# Patient Record
Sex: Female | Born: 1955 | Race: White | Hispanic: No | Marital: Married | Smoking: Never smoker
Health system: Southern US, Community
[De-identification: ages and names within clinical notes are randomized; demographics above are authoritative.]

## PROBLEM LIST (undated history)

## (undated) DIAGNOSIS — I10 Essential (primary) hypertension: Secondary | ICD-10-CM

## (undated) DIAGNOSIS — R232 Flushing: Secondary | ICD-10-CM

## (undated) DIAGNOSIS — C50919 Malignant neoplasm of unspecified site of unspecified female breast: Secondary | ICD-10-CM

## (undated) DIAGNOSIS — M1990 Unspecified osteoarthritis, unspecified site: Secondary | ICD-10-CM

## (undated) DIAGNOSIS — G4733 Obstructive sleep apnea (adult) (pediatric): Secondary | ICD-10-CM

## (undated) DIAGNOSIS — K219 Gastro-esophageal reflux disease without esophagitis: Secondary | ICD-10-CM

## (undated) DIAGNOSIS — F419 Anxiety disorder, unspecified: Secondary | ICD-10-CM

## (undated) DIAGNOSIS — Z9989 Dependence on other enabling machines and devices: Secondary | ICD-10-CM

## (undated) DIAGNOSIS — F32A Depression, unspecified: Secondary | ICD-10-CM

## (undated) DIAGNOSIS — F329 Major depressive disorder, single episode, unspecified: Secondary | ICD-10-CM

## (undated) DIAGNOSIS — E785 Hyperlipidemia, unspecified: Secondary | ICD-10-CM

## (undated) HISTORY — PX: TUBAL LIGATION: SHX77

## (undated) HISTORY — DX: Depression, unspecified: F32.A

## (undated) HISTORY — DX: Gastro-esophageal reflux disease without esophagitis: K21.9

## (undated) HISTORY — PX: APPENDECTOMY: SHX54

## (undated) HISTORY — DX: Obstructive sleep apnea (adult) (pediatric): G47.33

## (undated) HISTORY — PX: OTHER SURGICAL HISTORY: SHX169

## (undated) HISTORY — DX: Essential (primary) hypertension: I10

## (undated) HISTORY — DX: Major depressive disorder, single episode, unspecified: F32.9

## (undated) HISTORY — DX: Anxiety disorder, unspecified: F41.9

## (undated) HISTORY — DX: Dependence on other enabling machines and devices: Z99.89

## (undated) HISTORY — DX: Flushing: R23.2

## (undated) HISTORY — DX: Hyperlipidemia, unspecified: E78.5

## (undated) HISTORY — DX: Malignant neoplasm of unspecified site of unspecified female breast: C50.919

---

## 1998-10-02 ENCOUNTER — Encounter (INDEPENDENT_AMBULATORY_CARE_PROVIDER_SITE_OTHER): Payer: Self-pay | Admitting: *Deleted

## 1998-10-02 ENCOUNTER — Ambulatory Visit (HOSPITAL_COMMUNITY): Admission: RE | Admit: 1998-10-02 | Discharge: 1998-10-02 | Payer: Self-pay | Admitting: Obstetrics and Gynecology

## 1998-11-23 ENCOUNTER — Ambulatory Visit (HOSPITAL_COMMUNITY): Admission: RE | Admit: 1998-11-23 | Discharge: 1998-11-23 | Payer: Self-pay | Admitting: *Deleted

## 1998-11-24 ENCOUNTER — Other Ambulatory Visit: Admission: RE | Admit: 1998-11-24 | Discharge: 1998-11-24 | Payer: Self-pay | Admitting: Obstetrics and Gynecology

## 1999-11-25 ENCOUNTER — Other Ambulatory Visit: Admission: RE | Admit: 1999-11-25 | Discharge: 1999-11-25 | Payer: Self-pay | Admitting: Obstetrics and Gynecology

## 2000-08-07 ENCOUNTER — Encounter: Payer: Self-pay | Admitting: Diagnostic Radiology

## 2000-08-07 ENCOUNTER — Encounter: Admission: RE | Admit: 2000-08-07 | Discharge: 2000-08-07 | Payer: Self-pay | Admitting: Endocrinology

## 2000-08-07 ENCOUNTER — Encounter: Admission: RE | Admit: 2000-08-07 | Discharge: 2000-08-07 | Payer: Self-pay | Admitting: Diagnostic Radiology

## 2000-08-07 ENCOUNTER — Encounter: Payer: Self-pay | Admitting: Endocrinology

## 2001-01-16 ENCOUNTER — Other Ambulatory Visit: Admission: RE | Admit: 2001-01-16 | Discharge: 2001-01-16 | Payer: Self-pay | Admitting: Obstetrics and Gynecology

## 2001-09-27 ENCOUNTER — Encounter (INDEPENDENT_AMBULATORY_CARE_PROVIDER_SITE_OTHER): Payer: Self-pay

## 2001-09-27 ENCOUNTER — Ambulatory Visit (HOSPITAL_COMMUNITY): Admission: RE | Admit: 2001-09-27 | Discharge: 2001-09-27 | Payer: Self-pay | Admitting: Obstetrics and Gynecology

## 2001-09-27 ENCOUNTER — Encounter: Payer: Self-pay | Admitting: Obstetrics and Gynecology

## 2002-03-12 ENCOUNTER — Encounter: Admission: RE | Admit: 2002-03-12 | Discharge: 2002-03-12 | Payer: Self-pay | Admitting: Obstetrics & Gynecology

## 2002-03-12 ENCOUNTER — Other Ambulatory Visit: Admission: RE | Admit: 2002-03-12 | Discharge: 2002-03-12 | Payer: Self-pay | Admitting: Obstetrics & Gynecology

## 2002-03-12 ENCOUNTER — Encounter: Payer: Self-pay | Admitting: Obstetrics & Gynecology

## 2002-03-14 ENCOUNTER — Other Ambulatory Visit: Admission: RE | Admit: 2002-03-14 | Discharge: 2002-03-14 | Payer: Self-pay | Admitting: General Surgery

## 2003-05-06 ENCOUNTER — Other Ambulatory Visit: Admission: RE | Admit: 2003-05-06 | Discharge: 2003-05-06 | Payer: Self-pay | Admitting: Obstetrics and Gynecology

## 2004-03-28 HISTORY — PX: BREAST SURGERY: SHX581

## 2004-05-26 ENCOUNTER — Other Ambulatory Visit: Admission: RE | Admit: 2004-05-26 | Discharge: 2004-05-26 | Payer: Self-pay | Admitting: General Surgery

## 2004-06-03 ENCOUNTER — Encounter: Admission: RE | Admit: 2004-06-03 | Discharge: 2004-06-03 | Payer: Self-pay | Admitting: General Surgery

## 2004-06-04 ENCOUNTER — Ambulatory Visit (HOSPITAL_BASED_OUTPATIENT_CLINIC_OR_DEPARTMENT_OTHER): Admission: RE | Admit: 2004-06-04 | Discharge: 2004-06-04 | Payer: Self-pay | Admitting: General Surgery

## 2004-06-04 ENCOUNTER — Encounter (INDEPENDENT_AMBULATORY_CARE_PROVIDER_SITE_OTHER): Payer: Self-pay | Admitting: Specialist

## 2004-06-04 ENCOUNTER — Ambulatory Visit (HOSPITAL_COMMUNITY): Admission: RE | Admit: 2004-06-04 | Discharge: 2004-06-04 | Payer: Self-pay | Admitting: General Surgery

## 2004-09-30 ENCOUNTER — Encounter: Admission: RE | Admit: 2004-09-30 | Discharge: 2004-09-30 | Payer: Self-pay | Admitting: Endocrinology

## 2005-05-30 ENCOUNTER — Encounter: Admission: RE | Admit: 2005-05-30 | Discharge: 2005-05-30 | Payer: Self-pay | Admitting: Obstetrics and Gynecology

## 2005-06-14 ENCOUNTER — Encounter: Admission: RE | Admit: 2005-06-14 | Discharge: 2005-06-14 | Payer: Self-pay | Admitting: Obstetrics and Gynecology

## 2005-12-09 ENCOUNTER — Encounter: Admission: RE | Admit: 2005-12-09 | Discharge: 2005-12-09 | Payer: Self-pay | Admitting: General Surgery

## 2006-06-22 ENCOUNTER — Encounter: Admission: RE | Admit: 2006-06-22 | Discharge: 2006-06-22 | Payer: Self-pay | Admitting: Obstetrics and Gynecology

## 2006-08-28 ENCOUNTER — Encounter (INDEPENDENT_AMBULATORY_CARE_PROVIDER_SITE_OTHER): Payer: Self-pay | Admitting: *Deleted

## 2006-08-28 ENCOUNTER — Ambulatory Visit (HOSPITAL_COMMUNITY): Admission: RE | Admit: 2006-08-28 | Discharge: 2006-08-28 | Payer: Self-pay | Admitting: *Deleted

## 2006-10-30 ENCOUNTER — Encounter: Admission: RE | Admit: 2006-10-30 | Discharge: 2006-10-30 | Payer: Self-pay | Admitting: Endocrinology

## 2007-06-25 ENCOUNTER — Encounter: Admission: RE | Admit: 2007-06-25 | Discharge: 2007-06-25 | Payer: Self-pay | Admitting: Obstetrics and Gynecology

## 2008-06-26 ENCOUNTER — Encounter: Admission: RE | Admit: 2008-06-26 | Discharge: 2008-06-26 | Payer: Self-pay | Admitting: Endocrinology

## 2009-07-02 ENCOUNTER — Encounter: Admission: RE | Admit: 2009-07-02 | Discharge: 2009-07-02 | Payer: Self-pay | Admitting: Obstetrics and Gynecology

## 2009-08-31 ENCOUNTER — Encounter (INDEPENDENT_AMBULATORY_CARE_PROVIDER_SITE_OTHER): Payer: Self-pay | Admitting: *Deleted

## 2009-09-18 ENCOUNTER — Encounter (INDEPENDENT_AMBULATORY_CARE_PROVIDER_SITE_OTHER): Payer: Self-pay | Admitting: *Deleted

## 2009-10-27 ENCOUNTER — Ambulatory Visit: Payer: Self-pay | Admitting: Gastroenterology

## 2010-04-18 ENCOUNTER — Encounter: Payer: Self-pay | Admitting: Obstetrics and Gynecology

## 2010-04-27 NOTE — Letter (Signed)
Summary: Previsit letter  [facility]  [address] [place], [place] [address]   Phone: [phone]  Fax: [phone]       08/31/2009 MRN: [id number]  Sherry Stafford [address] [place], [place]  [address]  Dear Ms. Sherry Stafford,  Welcome to the Gastroenterology Division at [facility].    You are scheduled to see a nurse for your pre-procedure visit on 09-25-09 at 2:00p.m. on the 3rd floor at [facility], [address].  We ask that you try to arrive at our office 15 minutes prior to your appointment time to allow for check-in.  Your nurse visit will consist of discussing your medical and surgical history, your immediate family medical history, and your medications.    Please bring a complete list of all your medications or, if you prefer, bring the medication bottles and we will list them.  We will need to be aware of both prescribed and over the counter drugs.  We will need to know exact dosage information as well.  If you are on blood thinners (Coumadin, Plavix, Aggrenox, Ticlid, etc.) please call our office today/prior to your appointment, as we need to consult with your physician about holding your medication.   Please be prepared to read and sign documents such as consent forms, a financial agreement, and acknowledgement forms.  If necessary, and with your consent, a friend or relative is welcome to sit-in on the nurse visit with you.  Please bring your insurance card so that we may make a copy of it.  If your insurance requires a referral to see a specialist, please bring your referral form from your primary care physician.  No co-pay is required for this nurse visit.     If you cannot keep your appointment, please call [phone] to cancel or reschedule prior to your appointment date.  This allows [place] the opportunity to schedule an appointment for another patient in need of care.    Thank you for choosing [facility] for your medical needs.   We appreciate the opportunity to care for you.  Please visit [place] at our website  to learn more about our practice.                     Sincerely.                                                                                                                   The Gastroenterology Division  Appended Document: Previsit letter patty, please call her.  I just recieved faxed notes from Dr. Wende Neighbors office about the patient whom I have never seen before.  It looks like she is being scheduled for a "direct" colonoscopy or perhaps EGD.  please cancel whatever procedure she is scheduled for and she will need ngi appt with me in office first.  she had colonoscopy June 2008 and EGD at same time...she had no polyps, cancers or barrett's on the exams.  It is not clear to  me why she needs any type of procedures now and so she needs to be seen in office first.  Appended Document: Previsit letter procedures cx and message left on home phone for pt to call and schedule NGI  Appended Document: Previsit letter left message on machine to call back   Appended Document: Previsit letter pt aware New pt appt made and new pt paper work mailed out

## 2010-04-27 NOTE — Assessment & Plan Note (Signed)
History of Present Illness Visit Type: Initial Visit Primary GI MD: Rob Bunting MD Primary Provider: Darci Needle, MD Chief Complaint: Discuss ECL History of Present Illness:      very pleasant 55 year old woman who is here to discuss having a colonoscopy due to her family history of colon cancer.  She has had 2 colonoscopies by Dr. Sabino Gasser.  We have records of one being done in June 2008 and it was normal. She is very certain that date is incorrect however we also have biopsy reports from that date from the EGD she also had done and it was dated 2008 as well.  she has never had colon polyps.  Was told she need repeat colonoscopy every 5 years.  she has had no changes in her bowel habits. No bleeding, no constipation, no worrisome diarrhea.           Current Medications (verified): 1)  Wellbutrin Xl 150 Mg Xr24h-Tab (Bupropion Hcl) .... Take One By Mouth Once Daily 2)  Crestor 5 Mg Tabs (Rosuvastatin Calcium) .... Take One By Mouth Once Daily 3)  Vitamin D 1000 Unit Tabs (Cholecalciferol) .... Take One By Mouth Once Daily 4)  Calcium 600 Mg Tabs (Calcium) .... Take One By Mouth Once Daily 5)  Coq10 100 Mg Caps (Coenzyme Q10) .... Take One By Mouth Once Daily 6)  Omega-3 350 Mg Caps (Omega-3 Fatty Acids) .... Take One By Mouth Once Daily 7)  Multivitamins  Tabs (Multiple Vitamin) .... Take One By Mouth Once Daily  Allergies (verified): No Known Drug Allergies  Past History:  Past Medical History: none  Past Surgical History: appendectomy 1971  Family History: father had colon cancer in his 63s  Social History: she is divorced, she has 2 children, she works for [employer], she does not smoke cigarettes, she drinks one alcoholic beverage per day.  Review of Systems       Pertinent positive and negative review of systems were noted in the above HPI and GI specific review of systems.  All other review of systems was otherwise negative.   Vital  Signs:  Patient profile:   55 year old female Height:      67 inches Weight:      182.0 pounds BMI:     28.61 Pulse rate:   88 / minute Pulse rhythm:   regular BP sitting:   118 / 78  (left arm) Cuff size:   regular  Vitals Entered By: Harlow Mares CMA Duncan Dull) (October 27, 2009 2:03 PM)  Physical Exam  Additional Exam:  Constitutional: generally well appearing Psychiatric: alert and oriented times 3 Eyes: extraocular movements intact Mouth: oropharynx moist, no lesions Neck: supple, no lymphadenopathy Cardiovascular: heart regular rate and rythm Lungs: CTA bilaterally Abdomen: soft, non-tender, non-distended, no obvious ascites, no peritoneal signs, normal bowel sounds Extremities: no lower extremity edema bilaterally Skin: no lesions on visible extremities    Impression & Recommendations:  Problem # 1:  Family history of colon cancer she will need a colonoscopy 5 years from that date of her most recent one. We will call over to Dr. Wende Neighbors office to get a hard copy of his reports. It does look according to my records that she had a colonoscopy in 2008 that she is fairly certain that that date did not sound correct. Whenever his heart copy report shows however we will go from that date onward.  Patient Instructions: 1)  We will get records sent over from Dr. Wende Neighbors office  from recent colonoscopy to make sure of the date and will schedule you for repeat at 5 year interval. 2)  The medication list was reviewed and reconciled.  All changed / newly prescribed medications were explained.  A complete medication list was provided to the patient / caregiver.

## 2010-04-27 NOTE — Procedures (Signed)
Summary: Colon   Colonoscopy  Procedure date:  08/28/2006  Findings:      Location:  [facility].   NAME:  Sherry Stafford, Sherry Stafford                ACCOUNT NO.:  [id number]      MEDICAL RECORD NO.:  [id number]          PATIENT TYPE:  AMB      LOCATION:  ENDO                         FACILITY:  [facility]      PHYSICIAN:  Georgiana Spinner, M.D.    DATE OF BIRTH:  1956-02-23      DATE OF PROCEDURE:   DATE OF DISCHARGE:                                  OPERATIVE REPORT      ANESTHESIA:  Demerol 50, Versed 7.5 mg.      PROCEDURE:  With the patient mildly sedated in the left lateral   decubitus position, the Pentax videoscopic colonoscope was inserted in   the rectum and passed under direct vision to the cecum identified by the   ileocecal valve and appendiceal orifice, both of which were   photographed.  From this point, the colonoscope was slowly withdrawn,   taking circumferential views of the colonic mucosa, stopping only in the   rectum which appeared normal on direct, showed hemorrhoids in   retroflexed view.  The endoscope was straightened and withdrawn.  The   patient's vital signs and pulse oximetry remained stable.  The patient   tolerated the procedure well with no apparent complications.      FINDINGS:  Internal hemorrhoids, otherwise, an unremarkable exam.      PLAN:  See endoscopy note for further details.                  ______________________________   Georgiana Spinner, M.D.            GMO/MEDQ  D:  08/28/2006  T:  08/28/2006  Job:  [id number]

## 2010-04-27 NOTE — Letter (Signed)
Summary: New Patient letter  [facility]  [address] [place], [place] [address]   Phone: [phone]  Fax: [phone]       09/18/2009 MRN: [id number]  Sherry Stafford [address] [place], [place]  [address]  Dear Ms. Sherry Stafford,  Welcome to the Gastroenterology Division at [facility].    You are scheduled to see Dr.  Christella Hartigan on 10/27/09  at 2 pm on the 3rd floor at [facility], [address].  We ask that you try to arrive at our office 15 minutes prior to your appointment time to allow for check-in.  We would like you to complete the enclosed self-administered evaluation form prior to your visit and bring it with you on the day of your appointment.  We will review it with you.  Also, please bring a complete list of all your medications or, if you prefer, bring the medication bottles and we will list them.  Please bring your insurance card so that we may make a copy of it.  If your insurance requires a referral to see a specialist, please bring your referral form from your primary care physician.  Co-payments are due at the time of your visit and may be paid by cash, check or credit card.     Your office visit will consist of a consult with your physician (includes a physical exam), any laboratory testing he/she may order, scheduling of any necessary diagnostic testing (e.g. x-ray, ultrasound, CT-scan), and scheduling of a procedure (e.g. Endoscopy, Colonoscopy) if required.  Please allow enough time on your schedule to allow for any/all of these possibilities.    If you cannot keep your appointment, please call [phone] to cancel or reschedule prior to your appointment date.  This allows [place] the opportunity to schedule an appointment for another patient in need of care.  If you do not cancel or reschedule by 5 p.m. the business day prior to your appointment date, you will be charged a $50.00 late cancellation/no-show fee.    Thank you for choosing [facility] for your medical needs.  We appreciate the opportunity to care for you.  Please visit [place] at our website  to learn more about our practice.                     Sincerely,                                                             The Gastroenterology Division   Appended Document: New Patient letter letter mailed

## 2010-04-27 NOTE — Procedures (Signed)
Summary: EGD   EGD  Procedure date:  08/28/2006  Findings:      Location: [facility]   NAME:  Sherry Stafford, Sherry Stafford                ACCOUNT NO.:  [id number]      MEDICAL RECORD NO.:  [id number]          PATIENT TYPE:  AMB      LOCATION:  ENDO                         FACILITY:  [facility]      PHYSICIAN:  Georgiana Spinner, M.D.    DATE OF BIRTH:  03-23-1956      DATE OF PROCEDURE:   DATE OF DISCHARGE:                                  OPERATIVE REPORT      PROCEDURE:  Upper endoscopy.      INDICATION:  Gastroesophageal reflux disease.      ANESTHESIA:  Demerol 50, Versed 7.5 mg.      PROCEDURE:  With the patient mildly sedated in the left lateral   decubitus position, the Pentax videoscopic endoscope was inserted in the   mouth, passed under direct vision through the esophagus which appeared   normal until it reached the distal esophagus and there were changes and   esophagitis, photographed and biopsied.  We entered into the stomach,   fundus, body, antrum, duodenal bulb, second portion of the duodenum were   all visualized.  From this point, the endoscope was slowly withdrawn,   taking circumferential views of the duodenal mucosa until the endoscope   had been pulled back into the stomach, placed in retroflexion to view   the stomach from below.  The endoscope was straightened and withdrawn,   taking circumferential views of any gastric and esophageal mucosa.  The   patient's vital signs and pulse oximetry remained stable.  The patient   tolerated the procedure well without apparent complications.      FINDINGS:  Esophagitis, biopsied.      PLAN:  We will await biopsy report.  The patient will call me for   results and follow up with me as an outpatient.  Procedure colonoscopy   as planned.                  ______________________________   Georgiana Spinner, M.D.            GMO/MEDQ  D:  08/28/2006  T:  08/28/2006  Job:  [id number]

## 2010-05-10 ENCOUNTER — Other Ambulatory Visit: Payer: Self-pay | Admitting: Obstetrics and Gynecology

## 2010-05-10 DIAGNOSIS — Z8262 Family history of osteoporosis: Secondary | ICD-10-CM

## 2010-05-10 DIAGNOSIS — E559 Vitamin D deficiency, unspecified: Secondary | ICD-10-CM

## 2010-05-10 DIAGNOSIS — N951 Menopausal and female climacteric states: Secondary | ICD-10-CM

## 2010-05-11 ENCOUNTER — Other Ambulatory Visit: Payer: Self-pay | Admitting: Obstetrics and Gynecology

## 2010-05-11 DIAGNOSIS — Z1231 Encounter for screening mammogram for malignant neoplasm of breast: Secondary | ICD-10-CM

## 2010-06-30 ENCOUNTER — Other Ambulatory Visit: Payer: Self-pay | Admitting: Obstetrics and Gynecology

## 2010-07-07 ENCOUNTER — Ambulatory Visit
Admission: RE | Admit: 2010-07-07 | Discharge: 2010-07-07 | Disposition: A | Discharge status: Home / Self Care | Payer: BC Managed Care – PPO | Source: Ambulatory Visit | Attending: Obstetrics and Gynecology | Admitting: Obstetrics and Gynecology

## 2010-07-07 DIAGNOSIS — E559 Vitamin D deficiency, unspecified: Secondary | ICD-10-CM

## 2010-07-07 DIAGNOSIS — Z1231 Encounter for screening mammogram for malignant neoplasm of breast: Secondary | ICD-10-CM

## 2010-07-07 DIAGNOSIS — Z8262 Family history of osteoporosis: Secondary | ICD-10-CM

## 2010-07-07 DIAGNOSIS — N951 Menopausal and female climacteric states: Secondary | ICD-10-CM

## 2010-08-10 NOTE — Op Note (Signed)
NAMEMARTINA, BRODBECK                ACCOUNT NO.:  [id number]   MEDICAL RECORD NO.:  [id number]          PATIENT TYPE:  AMB   LOCATION:  ENDO                         FACILITY:  [facility]   PHYSICIAN:  Georgiana Spinner, M.D.    DATE OF BIRTH:  May 22, 1955   DATE OF PROCEDURE:  DATE OF DISCHARGE:                               OPERATIVE REPORT   ANESTHESIA:  Demerol 50, Versed 7.5 mg.   PROCEDURE:  With the patient mildly sedated in the left lateral  decubitus position, the Pentax videoscopic colonoscope was inserted in  the rectum and passed under direct vision to the cecum identified by the  ileocecal valve and appendiceal orifice, both of which were  photographed.  From this point, the colonoscope was slowly withdrawn,  taking circumferential views of the colonic mucosa, stopping only in the  rectum which appeared normal on direct, showed hemorrhoids in  retroflexed view.  The endoscope was straightened and withdrawn.  The  patient's vital signs and pulse oximetry remained stable.  The patient  tolerated the procedure well with no apparent complications.   FINDINGS:  Internal hemorrhoids, otherwise, an unremarkable exam.   PLAN:  See endoscopy note for further details.           ______________________________  Georgiana Spinner, M.D.     GMO/MEDQ  D:  08/28/2006  T:  08/28/2006  Job:  [id number]

## 2010-08-10 NOTE — Op Note (Signed)
Sherry Stafford, Sherry Stafford                ACCOUNT NO.:  [id number]   MEDICAL RECORD NO.:  [id number]          PATIENT TYPE:  AMB   LOCATION:  ENDO                         FACILITY:  [facility]   PHYSICIAN:  Georgiana Spinner, M.D.    DATE OF BIRTH:  1955-08-19   DATE OF PROCEDURE:  DATE OF DISCHARGE:                               OPERATIVE REPORT   PROCEDURE:  Upper endoscopy.   INDICATION:  Gastroesophageal reflux disease.   ANESTHESIA:  Demerol 50, Versed 7.5 mg.   PROCEDURE:  With the patient mildly sedated in the left lateral  decubitus position, the Pentax videoscopic endoscope was inserted in the  mouth, passed under direct vision through the esophagus which appeared  normal until it reached the distal esophagus and there were changes and  esophagitis, photographed and biopsied.  We entered into the stomach,  fundus, body, antrum, duodenal bulb, second portion of the duodenum were  all visualized.  From this point, the endoscope was slowly withdrawn,  taking circumferential views of the duodenal mucosa until the endoscope  had been pulled back into the stomach, placed in retroflexion to view  the stomach from below.  The endoscope was straightened and withdrawn,  taking circumferential views of any gastric and esophageal mucosa.  The  patient's vital signs and pulse oximetry remained stable.  The patient  tolerated the procedure well without apparent complications.   FINDINGS:  Esophagitis, biopsied.   PLAN:  We will await biopsy report.  The patient will call me for  results and follow up with me as an outpatient.  Procedure colonoscopy  as planned.           ______________________________  Georgiana Spinner, M.D.     GMO/MEDQ  D:  08/28/2006  T:  08/28/2006  Job:  [id number]

## 2010-08-13 NOTE — Op Note (Signed)
[name], Sherry Stafford                ACCOUNT NO.:  [id number]   MEDICAL RECORD NO.:  [id number]          PATIENT TYPE:  AMB   LOCATION:  DSC                          FACILITY:  [facility]   PHYSICIAN:  Cherylynn Ridges III, M.D.DATE OF BIRTH:  [birth year]   DATE OF PROCEDURE:  06/04/2004  DATE OF DISCHARGE:                                 OPERATIVE REPORT   PREOPERATIVE DIAGNOSIS:  Left breast mass.   PREOPERATIVE DIAGNOSIS:  Left breast fibroadenoma.   PROCEDURE:  Left breast biopsy.   SURGEON:  Cherylynn Ridges, M.D.   ASSISTANT:  None.   ANESTHESIA:  Monitored anesthesia care with local anesthetic; approximately  20 mL of 1% Xylocaine and 0.25% Marcaine were used.   COMPLICATIONS:  None.   CONDITION:  Stable.   FINDINGS:  By frozen section, the mass in the left upper outer quadrant of  her left breast was a fibroadenoma, otherwise benign tissue; we will wait  permanent sections.   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  Most recently,  an attempted aspiration was done of the mass in the upper-outer quadrant  with resulting very little fluid.  We did send the fine needle aspiration of  that area and it showed some epithelial cells, no malignancy; however, with  a persistent mass after attempted aspiration, biopsy was recommended.   OPERATION:  The patient was taken to the operating room and placed on the  table in a supine position.  After an adequate amount of IV sedation was  given, she was prepped and draped in usual sterile manner, exposing the left  breast.   We anesthetized the skin with the combination mixture of 1% Xylocaine and  0.25% Marcaine.  Approximately 5 mL were used and injected with a 26-gauge  needle.  We then made a 3- to 4-cm transverse curvilinear incision using a  #15 blade and taken down into the subcutaneous tissue.  Using finger  palpation as a guide, we dissected  out this mass that was in the left upper  quadrant and had been marked on the skin level using a 15 blade and Allis  clamps.  We resected a piece of tissue probably measuring 3 x 4 cm in size  and sent that for pathology after I identified the palpable mass with the  suture.  On frozen section, this turned out to be a fibroadenoma.   We removed some deeper tissue which still had some abnormal sensation to it  or feeling to it and sent that for permanent sections.   We obtained hemostasis using electrocautery, then we closed in three layers,  a deep subcutaneous of mammary tissue layer of 3-0 Vicryl, reapproximated  the deep structures, a more superficial cutaneous layer of 4-0 Vicryl and  then the skin was closed using a running subcuticular stitch of 5-0 Vicryl.  Sterile Steri-Strips were applied to the wound along with a sterile  dressing.  All needle, sponge counts and instrument counts were correct.      JOW/MEDQ  D:  06/04/2004  T:  06/05/2004  Job:  [id number]

## 2010-08-13 NOTE — H&P (Signed)
[facility]  Patient:    Sherry Stafford, Sherry Stafford Visit Number: [id number] MRN: [id number]          Service Type: DSU Location: [facility] Attending Physician:  Lenoard Aden Dictated by:   Lenoard Aden, M.D. Admit Date:  09/27/2001   CC:         [facility], [place], [place]   History and Physical  SS#:                          [id number]  CHIEF COMPLAINT:              Menorrhagia and dysmenorrhea.  HISTORY OF PRESENT ILLNESS:   This is a 55 year old white female, G2 P2, with worsening dysmenorrhea and menorrhagia for endometrial cryoablation.  PAST MEDICAL HISTORY:         1. Hysteroscopy in 2000.                               2. Tubal ligation.                               3. Three previous pregnancies.                                  a. One miscarriage.                                  b. Two uncomplicated vaginal deliveries.  ALLERGIES:                    No known drug allergies.  SOCIAL HISTORY:               She is a nonsmoker, nondrinker.  Denies domestic or physical violence.  PHYSICAL EXAMINATION:  GENERAL:                      She is a well-developed, well-nourished white female in no apparent distress.  HEENT:                        Normal.  LUNGS:                        Clear.  HEART:                        Regular rhythm.  ABDOMEN:                      Soft, nontender.  PELVIC:                       Uterus bulky, mid position.  No adnexal masses.  EXTREMITIES:                  No cords.  NEUROLOGIC:                   Nonfocal.  IMPRESSION:                   Severe dysmenorrhea and menorrhagia.  PLAN:  Procedure with endometrial cryoablation.  The risks of anesthesia, infection, bleeding, uterine perforation and need for repair were discussed, failure risk of 6%, noted improvement rate up to 90% discussed, heavy vaginal discharge lasting up to three weeks discussed with the patient and she  acknowledges and wishes to proceed. Dictated by:   Lenoard Aden, M.D. Attending Physician:  Lenoard Aden DD:  09/27/01 TD:  09/27/01 Job: [id number]

## 2010-08-13 NOTE — Op Note (Signed)
[facility]  Patient:    PHINLEY, SCHALL Visit Number: [id number] MRN: [id number]          Service Type: DSU Location: [facility] Attending Physician:  Lenoard Aden Dictated by:   Lenoard Aden, M.D. Proc. Date: 09/27/01 Admit Date:  09/27/2001 Discharge Date: 09/27/2001                             Operative Report  PREOPERATIVE DIAGNOSIS:       Secondary menorrhea and menorrhagia.  POSTOPERATIVE DIAGNOSIS:      Secondary menorrhea and menorrhagia.  OPERATION:                    Endometrial cryoablation, dilatation and curettage.  SURGEON:                      Lenoard Aden, M.D.  ANESTHESIA:                   General and paracervical.  ESTIMATED BLOOD LOSS:         Less than 50 cc.  COMPLICATIONS:                None.  DRAINS:                       None.  COUNTS:                       Correct.  DISPOSITION:                  Patient to recovery in good condition.  DESCRIPTION OF PROCEDURE:     After being apprised of the risks of anesthesia, infection, bleeding, uterine perforation, and possible need for repair, failure rate of 6%, the patient was brought to the operating room where she was administered general anesthetic without complications, prepped and draped in the usual sterile fashion and is not catheterized at this time.  Ultrasound guidance is used.  The uterus sounds to 10 cm.  The D&C was performed using serrated curet after dilating the cervix up to a #23 Pratt dilator.  At this time, the cryoablation probe was placed.  The uterus sounded to 10 cm, placed in the right fundal area, watched under sonographic visualization for the ice ball which was formed on a six minute freeze.  The probe was then heated and pulled back 2 cm and replaced into the left fundal area as observed by ultrasound and a six minute freezes performed.  Good coverage of the entire endometrial cavity is noted.  Postprocedure the probe was heated  and removed. The patient tolerated the procedure well and was transferred to recovery in good condition. Dictated by:   Lenoard Aden, M.D. Attending Physician:  Lenoard Aden DD:  09/27/01 TD:  10/01/01 Job: [id number]

## 2010-11-10 ENCOUNTER — Other Ambulatory Visit: Payer: Self-pay | Admitting: Orthopedic Surgery

## 2010-11-10 DIAGNOSIS — M542 Cervicalgia: Secondary | ICD-10-CM

## 2010-11-12 ENCOUNTER — Ambulatory Visit
Admission: RE | Admit: 2010-11-12 | Discharge: 2010-11-12 | Disposition: A | Discharge status: Home / Self Care | Payer: BC Managed Care – PPO | Source: Ambulatory Visit | Attending: Orthopedic Surgery | Admitting: Orthopedic Surgery

## 2010-11-12 DIAGNOSIS — M542 Cervicalgia: Secondary | ICD-10-CM

## 2011-06-08 ENCOUNTER — Other Ambulatory Visit: Payer: Self-pay | Admitting: Obstetrics and Gynecology

## 2011-06-08 DIAGNOSIS — Z1231 Encounter for screening mammogram for malignant neoplasm of breast: Secondary | ICD-10-CM

## 2011-07-01 ENCOUNTER — Encounter: Payer: Self-pay | Admitting: Gastroenterology

## 2011-07-11 ENCOUNTER — Ambulatory Visit
Admission: RE | Admit: 2011-07-11 | Discharge: 2011-07-11 | Disposition: A | Discharge status: Home / Self Care | Payer: BC Managed Care – PPO | Source: Ambulatory Visit | Attending: Obstetrics and Gynecology | Admitting: Obstetrics and Gynecology

## 2011-07-11 DIAGNOSIS — Z1231 Encounter for screening mammogram for malignant neoplasm of breast: Secondary | ICD-10-CM

## 2011-07-12 ENCOUNTER — Other Ambulatory Visit: Payer: Self-pay | Admitting: Obstetrics and Gynecology

## 2011-07-12 DIAGNOSIS — R928 Other abnormal and inconclusive findings on diagnostic imaging of breast: Secondary | ICD-10-CM

## 2011-07-14 ENCOUNTER — Ambulatory Visit
Admission: RE | Admit: 2011-07-14 | Discharge: 2011-07-14 | Disposition: A | Discharge status: Home / Self Care | Payer: BC Managed Care – PPO | Source: Ambulatory Visit | Attending: Obstetrics and Gynecology | Admitting: Obstetrics and Gynecology

## 2011-07-14 DIAGNOSIS — R928 Other abnormal and inconclusive findings on diagnostic imaging of breast: Secondary | ICD-10-CM

## 2012-06-06 ENCOUNTER — Other Ambulatory Visit: Payer: Self-pay

## 2012-06-06 DIAGNOSIS — Z1231 Encounter for screening mammogram for malignant neoplasm of breast: Secondary | ICD-10-CM

## 2012-07-04 ENCOUNTER — Encounter: Payer: Self-pay | Admitting: Gastroenterology

## 2012-07-09 ENCOUNTER — Telehealth: Payer: Self-pay | Admitting: Gastroenterology

## 2012-07-09 NOTE — Telephone Encounter (Signed)
Advised pt it would be noted in chart that she transferred GI Care to [facility].

## 2012-07-17 ENCOUNTER — Ambulatory Visit
Admission: RE | Admit: 2012-07-17 | Discharge: 2012-07-17 | Disposition: A | Discharge status: Home / Self Care | Payer: BC Managed Care – PPO | Source: Ambulatory Visit

## 2012-07-17 DIAGNOSIS — Z1231 Encounter for screening mammogram for malignant neoplasm of breast: Secondary | ICD-10-CM

## 2012-07-19 ENCOUNTER — Other Ambulatory Visit: Payer: Self-pay | Admitting: Obstetrics and Gynecology

## 2012-07-19 DIAGNOSIS — R928 Other abnormal and inconclusive findings on diagnostic imaging of breast: Secondary | ICD-10-CM

## 2012-07-31 ENCOUNTER — Ambulatory Visit
Admission: RE | Admit: 2012-07-31 | Discharge: 2012-07-31 | Disposition: A | Discharge status: Home / Self Care | Payer: BC Managed Care – PPO | Source: Ambulatory Visit | Attending: Obstetrics and Gynecology | Admitting: Obstetrics and Gynecology

## 2012-07-31 ENCOUNTER — Other Ambulatory Visit: Payer: Self-pay | Admitting: Obstetrics and Gynecology

## 2012-07-31 DIAGNOSIS — R928 Other abnormal and inconclusive findings on diagnostic imaging of breast: Secondary | ICD-10-CM

## 2012-08-01 ENCOUNTER — Other Ambulatory Visit: Payer: Self-pay | Admitting: Obstetrics and Gynecology

## 2012-08-01 ENCOUNTER — Ambulatory Visit
Admission: RE | Admit: 2012-08-01 | Discharge: 2012-08-01 | Disposition: A | Discharge status: Home / Self Care | Payer: BC Managed Care – PPO | Source: Ambulatory Visit | Attending: Obstetrics and Gynecology | Admitting: Obstetrics and Gynecology

## 2012-08-01 DIAGNOSIS — R928 Other abnormal and inconclusive findings on diagnostic imaging of breast: Secondary | ICD-10-CM

## 2012-08-01 DIAGNOSIS — C50911 Malignant neoplasm of unspecified site of right female breast: Secondary | ICD-10-CM

## 2012-08-02 ENCOUNTER — Other Ambulatory Visit: Payer: BC Managed Care – PPO

## 2012-08-02 ENCOUNTER — Telehealth: Payer: Self-pay | Admitting: *Deleted

## 2012-08-02 NOTE — Telephone Encounter (Signed)
Confirmed [facility] for 08/08/12 at 0800.  Instructions and contact information given. 

## 2012-08-03 ENCOUNTER — Other Ambulatory Visit: Payer: BC Managed Care – PPO

## 2012-08-07 ENCOUNTER — Other Ambulatory Visit: Payer: Self-pay | Admitting: Obstetrics and Gynecology

## 2012-08-07 ENCOUNTER — Ambulatory Visit
Admission: RE | Admit: 2012-08-07 | Discharge: 2012-08-07 | Disposition: A | Discharge status: Home / Self Care | Payer: BC Managed Care – PPO | Source: Ambulatory Visit | Attending: Obstetrics and Gynecology | Admitting: Obstetrics and Gynecology

## 2012-08-07 DIAGNOSIS — R928 Other abnormal and inconclusive findings on diagnostic imaging of breast: Secondary | ICD-10-CM

## 2012-08-07 DIAGNOSIS — C50911 Malignant neoplasm of unspecified site of right female breast: Secondary | ICD-10-CM

## 2012-08-07 MED ORDER — GADOBENATE DIMEGLUMINE 529 MG/ML IV SOLN
18.0000 mL | Freq: Once | INTRAVENOUS | Status: AC | PRN
  Administered 2012-08-07: 18 mL via INTRAVENOUS

## 2012-08-08 ENCOUNTER — Other Ambulatory Visit: Payer: BC Managed Care – PPO | Admitting: Lab

## 2012-08-08 ENCOUNTER — Encounter: Payer: Self-pay | Admitting: *Deleted

## 2012-08-08 ENCOUNTER — Encounter: Payer: Self-pay | Admitting: Oncology

## 2012-08-08 ENCOUNTER — Ambulatory Visit: Payer: BC Managed Care – PPO

## 2012-08-08 ENCOUNTER — Ambulatory Visit
Admission: RE | Admit: 2012-08-08 | Discharge: 2012-08-08 | Disposition: A | Discharge status: Home / Self Care | Payer: BC Managed Care – PPO | Source: Ambulatory Visit | Attending: Radiation Oncology | Admitting: Radiation Oncology

## 2012-08-08 ENCOUNTER — Ambulatory Visit (HOSPITAL_BASED_OUTPATIENT_CLINIC_OR_DEPARTMENT_OTHER): Payer: BC Managed Care – PPO | Admitting: General Surgery

## 2012-08-08 ENCOUNTER — Telehealth: Payer: Self-pay | Admitting: *Deleted

## 2012-08-08 ENCOUNTER — Ambulatory Visit: Payer: BC Managed Care – PPO | Attending: General Surgery | Admitting: Physical Therapy

## 2012-08-08 ENCOUNTER — Encounter (INDEPENDENT_AMBULATORY_CARE_PROVIDER_SITE_OTHER): Payer: Self-pay | Admitting: General Surgery

## 2012-08-08 ENCOUNTER — Ambulatory Visit (HOSPITAL_BASED_OUTPATIENT_CLINIC_OR_DEPARTMENT_OTHER): Payer: BC Managed Care – PPO | Admitting: Oncology

## 2012-08-08 VITALS — BP 119/75 | HR 108 | Temp 98.6°F | Resp 20 | Ht 67.0 in | Wt 179.7 lb

## 2012-08-08 DIAGNOSIS — C50911 Malignant neoplasm of unspecified site of right female breast: Secondary | ICD-10-CM

## 2012-08-08 DIAGNOSIS — R293 Abnormal posture: Secondary | ICD-10-CM | POA: Insufficient documentation

## 2012-08-08 DIAGNOSIS — Z170 Estrogen receptor positive status [ER+]: Secondary | ICD-10-CM

## 2012-08-08 DIAGNOSIS — N63 Unspecified lump in unspecified breast: Secondary | ICD-10-CM

## 2012-08-08 DIAGNOSIS — C50511 Malignant neoplasm of lower-outer quadrant of right female breast: Secondary | ICD-10-CM | POA: Insufficient documentation

## 2012-08-08 DIAGNOSIS — C50919 Malignant neoplasm of unspecified site of unspecified female breast: Secondary | ICD-10-CM

## 2012-08-08 DIAGNOSIS — IMO0001 Reserved for inherently not codable concepts without codable children: Secondary | ICD-10-CM | POA: Insufficient documentation

## 2012-08-08 NOTE — Progress Notes (Signed)
Sherry Stafford [id number] [birth year] 57 y.o. 08/08/2012 11:34 AM  CC  Lenoard Aden, MD [address] [place] [address] Dr. Avel Peace Dr. Lurline Hare  REASON FOR CONSULTATION:  57 year old female with new diagnosis of screen detected right breast cancer measuring 1.6 cm. Patient was seen in the Multidisciplinary Breast Clinic for discussion of her treatment options.   STAGE:   Breast cancer, right breast-T1Nx   Primary site: Breast (Right)   Staging method: AJCC 7th Edition   Clinical: Stage IA (T1c, N0, cM0)   Summary: Stage IA (T1c, N0, cM0)  REFERRING PHYSICIAN: Dr. Avel Peace  HISTORY OF PRESENT ILLNESS:  Sherry Stafford is a 57 y.o. female.  With past medical history significant for hypertension anxiety and hyperlipidemia. Patient had a screening mammogram performed 07/17/2012. She was found to have a possible mass and calcifications in the right breast. She went on to have Spot magnification images performed that showed 8 mm ill defined spiculated mass with a few associated microcalcifications over the outer mid right breast. There were also 2 mm group of microcalcifications located 7 mm anterior to the spiculated mass. There were several other groups of microcalcifications within the upper outer right breast which were stable. She went nontender have a ultrasound performed that showed a irregular hypoechoic mass with shadowing at the 9:00 position of the right breast 5 cm from the nipple measuring 7 x 7 x 7 mm. Adjacent group of indeterminate microcalcifications 7 mm anterior to this mass were also noted. Patient was recommended biopsy of the mass. This was performed on 07/31/2012. 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 Tumor was ER positive PR positive HER-2/neu negative with Ki-67 5%. She went on to have MRI of the bilateral breasts performed on 08/07/2012. The MRI revealed in the right breast 1.3 x 0.9 x 1.6 cm  irregular enhancing spiculated mass in the middle third of the lateral aspect of the right breast with associated biopsy changes. At 12:00 in the anterior third of the right breast was another enhancing mass measuring 7 x 6 x 5 mm. Patient was recommended biopsy of the 12:00 mass. Her case was discussed at the multidisciplinary breast conference pathology and radiology were reviewed. She is now seen in the multidisciplinary breast clinic for discussion of treatment options. She is also seen by Dr. Lurline Hare Dr. Avel Peace. She is accompanied by her daughter and patient is without any significant complaints.   Past Medical History: Past Medical History  Diagnosis Date  . Hypertension   . Hyperlipidemia   . Depression   . Anxiety   . GERD (gastroesophageal reflux disease)   . Breast cancer   . Hot flashes     Past Surgical History: Past Surgical History  Procedure Laterality Date  . Appendectomy    . Tubal ligation    . Breast surgery Left 2006    open biopsy (benign)    Family History: Family History  Problem Relation Age of Onset  . Cancer Father   . Colon cancer Father     Social History History  Substance Use Topics  . Smoking status: Never Smoker   . Smokeless tobacco: Not on file  . Alcohol Use: 1.2 oz/week    2 Glasses of wine per week    Allergies: No Known Allergies  Current Medications: Current Outpatient Prescriptions  Medication Sig Dispense Refill  . buPROPion (WELLBUTRIN SR) 150 MG 12 hr tablet Take 150 mg by mouth 2 (two)  times daily.      . fluticasone (FLONASE) 50 MCG/ACT nasal spray Place 2 sprays into the nose daily.      Marland Kitchen lisinopril-hydrochlorothiazide (PRINZIDE,ZESTORETIC) 10-12.5 MG per tablet Take 1 tablet by mouth daily.      Marland Kitchen omega-3 acid ethyl esters (LOVAZA) 1 G capsule Take 2 g by mouth daily.      Marland Kitchen omeprazole (PRILOSEC) 20 MG capsule Take 20 mg by mouth daily.      . rosuvastatin (CRESTOR) 5 MG tablet Take 5 mg by mouth  daily.       No current facility-administered medications for this visit.    OB/GYN History: Menarche at age 54 menopause at 37 she was on hormone replacement therapy for less than a year she took her self off of it. Patient has had to live births first live birth was at 89.  Fertility Discussion: Not applicable Prior History of Cancer: No prior history  Health Maintenance:  Colonoscopy 09/02/2011 Bone Density 07/07/2010 Last PAP smear May 2013  ECOG PERFORMANCE STATUS: 0 - Asymptomatic  Genetic Counseling/testing: There is no significant family history of breast cancer ovarian cancer or uterine cancer or colon cancer. Genetic counseling and testing is not recommended  REVIEW OF SYSTEMS:  Comprehensive review of systems was performed and it is scan separately into the electronic medical record  PHYSICAL EXAMINATION: Blood pressure 119/75, pulse 108, temperature 98.6 F (37 C), temperature source Oral, resp. rate 20, height 5\' 7"  (1.702 m), weight 179 lb 11.2 oz (81.511 kg).  Patient is awake alert well-developed nourished female in no acute distress HEENT exam EOMI PERRLA sclerae anicteric no conjunctival pallor oral mucosa is moist neck is supple lungs clear bilaterally cardiovascular regular rate rhythm abdomen soft nontender nondistended bowel sounds are present no HSM extremities no edema neuro patient's alert oriented otherwise nonfocal Breast examination right breast: Area of ecchymosis and thickness from biopsy. No masses no nipple discharge Left breast no masses or nipple discharge   STUDIES/RESULTS: US Breast Right  07/31/2012   **ADDENDUM** CREATED: 07/31/2012 10:26:45  Ultrasound of the right axilla is unremarkable.  Addended by:  Melton Alar. Micheline Maze, M.D. on 07/31/2012 10:26:45.  **END ADDENDUM** SIGNED BY: Melton Alar. Micheline Maze, M.D.  07/31/2012   *RADIOLOGY REPORT*  Clinical Data:  Patient presents for additional views of the right breast as follow-up to a recent screening exam  suggesting a possible mass with adjacent microcalcifications.  DIGITAL DIAGNOSTIC RIGHT MAMMOGRAM  AND RIGHT BREAST ULTRASOUND:  Comparison:  07/11/2011, 07/07/2010, 07/02/2009, 06/26/2008, 06/25/2007, 06/22/2006 and 12/09/2005 as well as 05/30/2005  Findings:  ACR Breast Density Category 2: There is a scattered fibroglandular pattern.  Spot magnification images demonstrate an 8 mm ill-defined somewhat spiculated mass with a few associated microcalcifications over the outer mid right breast.  There is a 2 mm group of microcalcifications located 7 mm anterior to the spiculated mass. There are several other groups of microcalcifications within the upper outer right breast which are stable.  Ultrasound is performed, showing an irregular hypoechoic mass with shadowing at the 9 o'clock position of the right breast 5 cm from the nipple measuring 7 x 7 x 7 mm.  IMPRESSION: 7 x 7 x 7 mm irregular hypoechoic mass with shadowing at the 9 o'clock position of the right breast 5 cm from the nipple corresponding to the mammographic abnormality.  Adjacent group of indeterminate microcalcifications 7 mm anterior to this mass.  Two other stable groups of microcalcifications in the upper outer quadrant of the right  breast.  RECOMMENDATION: Recommend ultrasound-guided core needle biopsy of this suspicious mass at the 9 o'clock position.  If biopsy benign, would recommend 32-month follow-up diagnostic right breast mammogram with magnification views to assess stability of the microcalcifications.  I have discussed the findings and recommendations with the patient. Results were also provided in writing at the conclusion of the visit.  If applicable, a reminder letter will be sent to the patient regarding the next appointment.  BI-RADS CATEGORY 4:  Suspicious abnormality - biopsy should be considered.   Original Report Authenticated By: Elberta Fortis, M.D.   Mr Breast Bilateral W Wo Contrast  08/07/2012   *RADIOLOGY REPORT*  Clinical  Data: Recently diagnosed invasive ductal carcinoma in the 9 o'clock region of the right breast.  BILATERAL BREAST MRI WITH AND WITHOUT CONTRAST  Technique: Multiplanar, multisequence MR images of both breasts were obtained prior to and following the intravenous administration of 18ml of multihance.  Three dimensional images were evaluated at the independent DynaCad workstation.  Comparison:  Mammograms dated 07/31/2012, 07/18/2012, 14 13 and 07/12/2011.  Findings: There is a moderate background parenchymal enhancement pattern.  Left breast:  No abnormal enhancement is seen in the left breast.  Right breast: 1.  There is a 1.3 x 0.9 x 1.6 cm irregular, enhancing, spiculated mass in the middle third of the lateral aspect of the right breast. Associated biopsy changes and a signal void artifact from the clip is visualized. 2.  At 12 o'clock in the anterior third of the right breast is an enhancing 7 x 6 x 5 mm mass.  No enlarged axillary or internal mammary adenopathy is detected.  IMPRESSION:  1.  1.6 cm spiculated enhancing mass in the lateral aspect of the right breast corresponding well with the known invasive ductal carcinoma. 2.  Indeterminate mass in the 12 o'clock region of the right breast.  RECOMMENDATION: MR guided core biopsy of the mass in the 12 o'clock region of the right breast is recommended.  This will be scheduled at the patient's convenience.  THREE-DIMENSIONAL MR IMAGE RENDERING ON INDEPENDENT WORKSTATION:  Three-dimensional MR images were rendered by post-processing of the original MR data on an independent workstation.  The three- dimensional MR images were interpreted, and findings were reported in the accompanying complete MRI report for this study.  BI-RADS CATEGORY 4:  Suspicious abnormality - biopsy should be considered.   Original Report Authenticated By: Baird Lyons, M.D.   Mm Digital Diag Ltd R  07/31/2012   **ADDENDUM** CREATED: 07/31/2012 10:26:45  Ultrasound of the right axilla is  unremarkable.  Addended by:  Melton Alar. Micheline Maze, M.D. on 07/31/2012 10:26:45.  **END ADDENDUM** SIGNED BY: Melton Alar. Micheline Maze, M.D.  07/31/2012   *RADIOLOGY REPORT*  Clinical Data:  Patient presents for additional views of the right breast as follow-up to a recent screening exam suggesting a possible mass with adjacent microcalcifications.  DIGITAL DIAGNOSTIC RIGHT MAMMOGRAM  AND RIGHT BREAST ULTRASOUND:  Comparison:  07/11/2011, 07/07/2010, 07/02/2009, 06/26/2008, 06/25/2007, 06/22/2006 and 12/09/2005 as well as 05/30/2005  Findings:  ACR Breast Density Category 2: There is a scattered fibroglandular pattern.  Spot magnification images demonstrate an 8 mm ill-defined somewhat spiculated mass with a few associated microcalcifications over the outer mid right breast.  There is a 2 mm group of microcalcifications located 7 mm anterior to the spiculated mass. There are several other groups of microcalcifications within the upper outer right breast which are stable.  Ultrasound is performed, showing an irregular hypoechoic mass with shadowing at  the 9 o'clock position of the right breast 5 cm from the nipple measuring 7 x 7 x 7 mm.  IMPRESSION: 7 x 7 x 7 mm irregular hypoechoic mass with shadowing at the 9 o'clock position of the right breast 5 cm from the nipple corresponding to the mammographic abnormality.  Adjacent group of indeterminate microcalcifications 7 mm anterior to this mass.  Two other stable groups of microcalcifications in the upper outer quadrant of the right breast.  RECOMMENDATION: Recommend ultrasound-guided core needle biopsy of this suspicious mass at the 9 o'clock position.  If biopsy benign, would recommend 43-month follow-up diagnostic right breast mammogram with magnification views to assess stability of the microcalcifications.  I have discussed the findings and recommendations with the patient. Results were also provided in writing at the conclusion of the visit.  If applicable, a reminder letter will  be sent to the patient regarding the next appointment.  BI-RADS CATEGORY 4:  Suspicious abnormality - biopsy should be considered.   Original Report Authenticated By: Elberta Fortis, M.D.   Mm Digital Diagnostic Unilat R  07/31/2012   *RADIOLOGY REPORT*  Clinical Data: The patient is post ultrasound guided core biopsy of a 7 mm spiculated mass over the outer mid right breast.  ULTRASOUND GUIDED POST-BIOPSY CLIP PLACEMENT RIGHT  Comparison:  Previous exams.  Findings: Exam demonstrates accurate placement of a bullet shaped secure mark clip over the biopsied mass in the outer mid right breast.  IMPRESSION: Satisfactory clip placement post ultrasound guided core right breast biopsy.   Original Report Authenticated By: Elberta Fortis, M.D.   Mm Digital Screening  07/18/2012   *RADIOLOGY REPORT*  Clinical Data: Screening.  DIGITAL BILATERAL SCREENING MAMMOGRAM WITH CAD DIGITAL BREAST TOMOSYNTHESIS  Digital breast tomosynthesis images are acquired in two projections.  These images are reviewed in combination with the digital mammogram, confirming the findings below.  Comparison:  Previous exams.  FINDINGS:  ACR Breast Density Category 2: There is a scattered fibroglandular pattern.  In the right breast, calcifications and possible mass warrant further evaluation with magnified views with possible ultrasound. In the left breast, no mass or malignant type calcifications are identified.  Images were processed with CAD.  IMPRESSION: Further evaluation is suggested for calcifications and possible mass in the right breast.  RECOMMENDATION: Diagnostic mammogram and possible ultrasound of the right breast. (Code:FI-R-70M)  The patient will be contacted regarding the findings, and additional imaging will be scheduled.  BI-RADS CATEGORY 0:  Incomplete.  Need additional imaging evaluation and/or prior mammograms for comparison.   Original Report Authenticated By: Harmon Pier, M.D.   Mm Radiologist Eval And Mgmt  08/01/2012    *RADIOLOGY REPORT*  Clinical Data: Status post biopsy in the right breast.  Patient is here for results and post biopsy examination.  CONSULTATION  Comparison: Prior films  Findings: The pathology revealed invasive ductal carcinoma.  This is found to be concordant with imaging findings.  I discussed the results with the patient and her husband and answered their questions.  The patient states she is doing well post biopsy with minimal tenderness at biopsy site.  I examined the biopsy site. The biopsy site is clean, soft without complications.  Recommend MRI of the breasts and surgical consultation.   The patient has appointment for MRI of the breasts on Aug 03 2012 and has appointment on Aug 08, 2012 for multidisciplinary cancer clinic.  IMPRESSION: Post biopsy consultation as described.   Original Report Authenticated By: Sherian Rein, M.D.   [place] Rt  Breast Bx W Loc Dev 1st Lesion Img Bx Spec US Guide  07/31/2012   *RADIOLOGY REPORT*  Clinical Data:  Patient presents for ultrasound-guided core biopsy of a 7 mm irregular mass with shadowing at the 9 o'clock position of the right breast 5 cm from the nipple.  ULTRASOUND GUIDED VACUUM ASSISTED CORE BIOPSY OF THE RIGHT BREAST  Comparison: Previous exams.  I met with the patient and we discussed the procedure of ultrasound- guided biopsy, including benefits and alternatives.  We discussed the high likelihood of a successful procedure. We discussed the risks of the procedure including infection, bleeding, tissue injury, clip migration, and inadequate sampling.  Informed written consent was given.  Using sterile technique, 2% lidocaine ultrasound guidance and a 12 gauge vacuum assisted needle biopsy was performed of the targeted mass at the 9 o'clock position using a inferior to superior approach.  At the conclusion of the procedure, a bullet shaped secure-mark clip was deployed into the biopsy cavity.  Follow-up 2- view mammogram was performed and dictated separately.   IMPRESSION: Ultrasound-guided biopsy of of a 7 mm irregular mass at the 9 o'clock position of the right breast.  No apparent complications.   Original Report Authenticated By: Elberta Fortis, M.D.     LABS:    Chemistry   No results found for this basename: NA, K, CL, CO2, BUN, CREATININE, GLU   No results found for this basename: CALCIUM, ALKPHOS, AST, ALT, BILITOT      No results found for this basename: WBC, HGB, HCT, MCV, PLT    PATHOLOGY: ANALYSIS BY THE AUTOMATED CELLULAR IMAGING SYSTEM (ACIS) Estrogen Receptor: 100%, POSITIVE, STRONG STAINING INTENSITY Progesterone Receptor: 41%, POSITIVE, STRONG STAINING INTENSITY Proliferation Marker Ki67: 6% REFERENCE RANGE ESTROGEN RECEPTOR NEGATIVE <1% POSITIVE =>1% PROGESTERONE RECEPTOR NEGATIVE <1% POSITIVE =>1% All controls stained appropriately Pecola Leisure MD Pathologist, Electronic Signature ( Signed 08/08/2012) CHROMOGENIC IN-SITU HYBRIDIZATION Results: HER-2/NEU BY CISH - NO AMPLIFICATION OF HER-2 DETECTED. RESULT RATIO OF HER2: CEP 17 SIGNALS 1.33 AVERAGE HER2 COPY NUMBER PER CELL 1.80 REFERENCE RANGE 1 of 3 Duplicate copy FINAL for Sherry Stafford, Sherry Stafford [phone]) ADDITIONAL INFORMATION:(continued) NEGATIVE HER2/Chr17 Ratio <2.0 and Average HER2 copy number <4.0 EQUIVOCAL HER2/Chr17 Ratio <2.0 and Average HER2 copy number 4.0 and <6.0 POSITIVE HER2/Chr17 Ratio >=2.0 and/or Average HER2 copy number >=6.0 Pecola Leisure MD Pathologist, Electronic Signature ( Signed 08/07/2012) FINAL DIAGNOSIS Diagnosis Breast, right, needle core biopsy, mass, 9 o'clock - INVASIVE DUCTAL CARCINOMA. - DUCTAL CARCINOMA IN SITU. - ASSOCIATED MICROCALCIFICATION. - SEE COMMENT. Microscopic Comment Although definitive grading of breast carcinoma is best done on excision, the features of the invasive tumor from the right 9 o'clock are compatible with a grade I breast carcinoma. Breast prognostic markers will be performed and reported in an  addendum. Findings are called to [facility] on 08/01/12. Dr. Frederica Kuster has seen this case in consultation with agreement. (RAH:gt, 08/01/12) Zandra Abts MD Pathologist, Electronic Signature (Case signed 08/01/2012) Specimen Gross and Clinical Information ASSESSMENT    57 year old female with  #1 new diagnosis a screen detected grade 1 invasive ductal carcinoma of the right breast. Tumor measures by MRI 1.6 cm. It is ER +100% PR +41% Ki-67 6% HER-2/neu negative. Radiographic staging stage I (T1 Nx) patient is seen in the multidisciplinary breast clinic for discussion of treatment options. She was found to have an incidental area of concern anteriorly to the known mass that needs to be biopsied. Patient is suggested this if she desires breast conservation.  Recommendation is to proceed with biopsy first. If this is positive she'll most likely will need a mastectomy. However if this is negative then she certainly would be a candidate for lumpectomy with sentinel lymph node biopsy.  #2 patient and I discussed adjuvant treatment. We also discussed her pathology rationale for adjuvant treatment. We also discussed Oncotype DX testing since her tumor is 1.6 cm by MRI I do think she would be a good candidate for this. We discussed rationale for this.  #3 patient was also seen by radiation oncology for discussion of adjuvant radiation therapy if she has breast conservation.  #4 patient and I discussed genetic counseling and testing. Patient is the only one with breast cancer no other history of malignancies in the family. At this time I do not think she is a candidate for genetic testing. However should her history change we certainly will be referring her to the genetic counselor.  Clinical Trial Eligibility: No Multidisciplinary conference discussion yes     PLAN:    #1 proceed with biopsy of the additional mass found in the right breast  #2 proceed with definitive surgery  #3 I  will see her back after her surgery. We will also send Oncotype DX testing       Discussion: Patient is being treated per NCCN breast cancer care guidelines appropriate for stage.I   Thank you so much for allowing me to participate in the care of Sherry Stafford. I will continue to follow up the patient with you and assist in her care.  All questions were answered. The patient knows to call the clinic with any problems, questions or concerns. We can certainly see the patient much sooner if necessary.  I spent 60 minutes counseling the patient face to face. The total time spent in the appointment was 60 minutes.  Drue Second, MD Medical/Oncology [facility] [phone] (beeper) [phone] (Office)  08/08/2012, 11:34 AM

## 2012-08-08 NOTE — Patient Instructions (Signed)
We will talk after the biopsy results are back.

## 2012-08-08 NOTE — Progress Notes (Signed)
Patient ID: Sherry Stafford, female   DOB: 09-Feb-1956, 57 y.o.   MRN: [id number]  No chief complaint on file.   HPI Sherry Stafford is a 57 y.o. female.   HPI  She is referred by Dr. Juel Burrow for further evaluation and treatment of newly diagnosed right breast cancer at the 9:00 position.  She was noted to have an abnormality on screening mammogram. Ultrasound demonstrated the area to be 7 mm. 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  HER-2 negative. Proliferation a 5%. An MRI was performed which demonstrated the lesion to be 1.6 cm in size. A second mass was noted in the right breast 12:00 position which was 7 mm in size. Biopsy of this mass was recommended. No family history of breast cancer. Menarche was at age 59. First live birth was at age 7. Menopause was at age 46 or 84. No prolonged exogenous estrogen use. No breast masses or pain. No nipple discharge.  She had a previous left breast biopsy in the past that was consistent with fibroadenoma.  Past Medical History  Diagnosis Date  . Hypertension   . Hyperlipidemia   . Depression   . Anxiety   . GERD (gastroesophageal reflux disease)     Past Surgical History  Procedure Laterality Date  . Appendectomy    . Tubal ligation    . Breast surgery Left 2006    open biopsy (benign)    Family History  Problem Relation Age of Onset  . Cancer Father     Social History History  Substance Use Topics  . Smoking status: Never Smoker   . Smokeless tobacco: Not on file  . Alcohol Use: Yes    No Known Allergies  Current Outpatient Prescriptions  Medication Sig Dispense Refill  . buPROPion (WELLBUTRIN SR) 150 MG 12 hr tablet Take 150 mg by mouth 2 (two) times daily.      . fluticasone (FLONASE) 50 MCG/ACT nasal spray Place 2 sprays into the nose daily.      Marland Kitchen lisinopril-hydrochlorothiazide (PRINZIDE,ZESTORETIC) 10-12.5 MG per tablet Take 1 tablet by mouth daily.      Marland Kitchen omega-3  acid ethyl esters (LOVAZA) 1 G capsule Take 2 g by mouth daily.      Marland Kitchen omeprazole (PRILOSEC) 20 MG capsule Take 20 mg by mouth daily.      . rosuvastatin (CRESTOR) 5 MG tablet Take 5 mg by mouth daily.       No current facility-administered medications for this visit.    Review of Systems Review of Systems  Constitutional: Negative.   HENT: Positive for congestion.   Respiratory: Positive for cough.   Cardiovascular: Negative.   Gastrointestinal: Negative.   Endocrine: Negative.   Genitourinary: Negative.   Skin: Negative.   Neurological: Negative.   Hematological: Negative.     There were no vitals taken for this visit.  Physical Exam Physical Exam  Constitutional: She appears well-developed and well-nourished. No distress.  HENT:  Head: Normocephalic and atraumatic.  Eyes: EOM are normal. No scleral icterus.  Neck: Neck supple.  Cardiovascular: Normal rate and regular rhythm.   Pulmonary/Chest: Effort normal and breath sounds normal.  Right breast demonstrates ecchymosis laterally. No dominant palpable mass. Left breast demonstrates no dominant palpable mass or suspicious skin change.  No supraclavicular adenopathy.  Abdominal: Soft. She exhibits no distension and no mass. There is no tenderness.  Musculoskeletal: She exhibits no edema.  No axillary adenopathy.  Lymphadenopathy:    She has no cervical adenopathy.  Neurological: She is alert.  Skin: Skin is warm and dry.    Data Reviewed Mammogram, ultrasound, and MRI, pathology report  Assessment     Newly diagnosed invasive ductal carcinoma of right breast. She is a good candidate for right partial mastectomy and sentinel lymph node biopsy if the mass at 12:00 is completely benign. If the mass at 12:00 is potentially premalignant or malignant and I would recommend mastectomy. Sentinel lymph node biopsy would also be performed. She did be interested in reconstruction if mastectomy is recommended.     Plan     Will await the results of the biopsy of the 12:00 lesion on the right side. I did discuss partial mastectomy with sentinel lymph node biopsy and mastectomy with sentinel lymph node biopsy with her in detail and the risks were discussed as well.  Risks include but are not limited to bleeding, infection, wound problems, anesthesia, chronic chest wall pain, nerve injury, seroma formation, lymphedema.  She seems to understand and agrees with the plan.        Arayla Kruschke J 08/08/2012, 10:03 AM

## 2012-08-08 NOTE — Progress Notes (Signed)
Checked in new pt with no financial concerns. °

## 2012-08-08 NOTE — Telephone Encounter (Signed)
appts made and printed. Pt is aware that LM will arrange her oncotype DX testing. i printed out the pof and gave it to [employer]...td

## 2012-08-08 NOTE — Patient Instructions (Addendum)
Proceed with biopsy of the lesion seen in the MRI.  Than you will proceed with surgery.  We discussed adjuvant therapy, we also discussed doing Oncotype DX on the final specimen.  I will plan on seeing you back on 08/24/2012.

## 2012-08-08 NOTE — Progress Notes (Signed)
Radiation Oncology         [phone]) [phone] ________________________________  Initial outpatient Consultation - Date: 08/08/2012   Name: Sherry Stafford MRN: [id number]   DOB: 20-Mar-1956  REFERRING PHYSICIAN: Adolph Pollack, MD  DIAGNOSIS: T1c N0 Grade I Invasive Ductal Carcinoma  HISTORY OF PRESENT ILLNESS::Sherry Stafford is a 57 y.o. female  underwent a screening right mammogram. A mass and calcifications were noted in the right breast. The calcifications were present about 7 mm anterior to the mass. MRI showed a 1.6 cm mass in the 9:00 position of the right breast. A second mass was noted below the nipple measuring about 7 mm. MRI guided biopsy was recommended and is scheduled for the 21st. Biopsy of the right breast mass showed a grade 1 invasive ductal carcinoma which was ER positive PR positive and HER-2 negative. Ki-67 was 5%. She really has not had any pain after her biopsy. She could not feel this mass. She lives in [place] and works in [place]. Accompanied by her daughter today. She is interested in breast conservation if possible.Marland Kitchen  PREVIOUS RADIATION THERAPY: No  PAST MEDICAL HISTORY:  has a past medical history of Hypertension; Hyperlipidemia; Depression; Anxiety; and GERD (gastroesophageal reflux disease).    PAST SURGICAL HISTORY: Past Surgical History  Procedure Laterality Date  . Appendectomy    . Tubal ligation    . Breast surgery Left 2006    open biopsy (benign)    FAMILY HISTORY: Father with lung and colon cancer  SOCIAL HISTORY:  History  Substance Use Topics  . Smoking status: Never Smoker   . Smokeless tobacco: Not on file  . Alcohol Use: Yes    ALLERGIES: Review of patient's allergies indicates no known allergies.  MEDICATIONS:  Current Outpatient Prescriptions  Medication Sig Dispense Refill  . buPROPion (WELLBUTRIN SR) 150 MG 12 hr tablet Take 150 mg by mouth 2 (two) times daily.      . fluticasone (FLONASE) 50 MCG/ACT nasal spray Place 2  sprays into the nose daily.      Marland Kitchen lisinopril-hydrochlorothiazide (PRINZIDE,ZESTORETIC) 10-12.5 MG per tablet Take 1 tablet by mouth daily.      Marland Kitchen omega-3 acid ethyl esters (LOVAZA) 1 G capsule Take 2 g by mouth daily.      Marland Kitchen omeprazole (PRILOSEC) 20 MG capsule Take 20 mg by mouth daily.      . rosuvastatin (CRESTOR) 5 MG tablet Take 5 mg by mouth daily.       No current facility-administered medications for this encounter.    REVIEW OF SYSTEMS:  A 15 point review of systems is documented in the electronic medical record. This was obtained by the nursing staff. However, I reviewed this with the patient to discuss relevant findings and make appropriate changes.  Pertinent items are noted in HPI.   PHYSICAL EXAM:  Wt Readings from Last 3 Encounters:  08/08/12 179 lb 11.2 oz (81.511 kg)  10/27/09 182 lb (82.555 kg)   Temp Readings from Last 3 Encounters:  08/08/12 98.6 F (37 C) Oral   BP Readings from Last 3 Encounters:  08/08/12 119/75  10/27/09 118/78   Pulse Readings from Last 3 Encounters:  08/08/12 108  10/27/09 88   She is a pleasant female in no distress sitting comfortably examining table. She has no palpable supra-particular or axillary adenopathy. No palpable abnormalities of the left breast. No palpable abnormalities right breast. In the lower outer quadrant of the right breast she does have some bruising. She is  alert minus x3. She has 5 out of 5 strength in her bilateral upper and lower extremities.  LABORATORY DATA:  No results found for this basename: WBC, HGB, HCT, MCV, PLT   No results found for this basename: NA, K, CL, CO2   No results found for this basename: ALT, AST, GGT, ALKPHOS, BILITOT     RADIOGRAPHY: US Breast Right  07/31/2012   **ADDENDUM** CREATED: 07/31/2012 10:26:45  Ultrasound of the right axilla is unremarkable.  Addended by:  Melton Alar. Micheline Maze, M.D. on 07/31/2012 10:26:45.  **END ADDENDUM** SIGNED BY: Melton Alar. Micheline Maze, M.D.  07/31/2012   *RADIOLOGY  REPORT*  Clinical Data:  Patient presents for additional views of the right breast as follow-up to a recent screening exam suggesting a possible mass with adjacent microcalcifications.  DIGITAL DIAGNOSTIC RIGHT MAMMOGRAM  AND RIGHT BREAST ULTRASOUND:  Comparison:  07/11/2011, 07/07/2010, 07/02/2009, 06/26/2008, 06/25/2007, 06/22/2006 and 12/09/2005 as well as 05/30/2005  Findings:  ACR Breast Density Category 2: There is a scattered fibroglandular pattern.  Spot magnification images demonstrate an 8 mm ill-defined somewhat spiculated mass with a few associated microcalcifications over the outer mid right breast.  There is a 2 mm group of microcalcifications located 7 mm anterior to the spiculated mass. There are several other groups of microcalcifications within the upper outer right breast which are stable.  Ultrasound is performed, showing an irregular hypoechoic mass with shadowing at the 9 o'clock position of the right breast 5 cm from the nipple measuring 7 x 7 x 7 mm.  IMPRESSION: 7 x 7 x 7 mm irregular hypoechoic mass with shadowing at the 9 o'clock position of the right breast 5 cm from the nipple corresponding to the mammographic abnormality.  Adjacent group of indeterminate microcalcifications 7 mm anterior to this mass.  Two other stable groups of microcalcifications in the upper outer quadrant of the right breast.  RECOMMENDATION: Recommend ultrasound-guided core needle biopsy of this suspicious mass at the 9 o'clock position.  If biopsy benign, would recommend 43-month follow-up diagnostic right breast mammogram with magnification views to assess stability of the microcalcifications.  I have discussed the findings and recommendations with the patient. Results were also provided in writing at the conclusion of the visit.  If applicable, a reminder letter will be sent to the patient regarding the next appointment.  BI-RADS CATEGORY 4:  Suspicious abnormality - biopsy should be considered.   Original Report  Authenticated By: Elberta Fortis, M.D.   Mr Breast Bilateral W Wo Contrast  08/07/2012   *RADIOLOGY REPORT*  Clinical Data: Recently diagnosed invasive ductal carcinoma in the 9 o'clock region of the right breast.  BILATERAL BREAST MRI WITH AND WITHOUT CONTRAST  Technique: Multiplanar, multisequence MR images of both breasts were obtained prior to and following the intravenous administration of 18ml of multihance.  Three dimensional images were evaluated at the independent DynaCad workstation.  Comparison:  Mammograms dated 07/31/2012, 07/18/2012, 14 13 and 07/12/2011.  Findings: There is a moderate background parenchymal enhancement pattern.  Left breast:  No abnormal enhancement is seen in the left breast.  Right breast: 1.  There is a 1.3 x 0.9 x 1.6 cm irregular, enhancing, spiculated mass in the middle third of the lateral aspect of the right breast. Associated biopsy changes and a signal void artifact from the clip is visualized. 2.  At 12 o'clock in the anterior third of the right breast is an enhancing 7 x 6 x 5 mm mass.  No enlarged axillary or internal mammary adenopathy  is detected.  IMPRESSION:  1.  1.6 cm spiculated enhancing mass in the lateral aspect of the right breast corresponding well with the known invasive ductal carcinoma. 2.  Indeterminate mass in the 12 o'clock region of the right breast.  RECOMMENDATION: MR guided core biopsy of the mass in the 12 o'clock region of the right breast is recommended.  This will be scheduled at the patient's convenience.  THREE-DIMENSIONAL MR IMAGE RENDERING ON INDEPENDENT WORKSTATION:  Three-dimensional MR images were rendered by post-processing of the original MR data on an independent workstation.  The three- dimensional MR images were interpreted, and findings were reported in the accompanying complete MRI report for this study.  BI-RADS CATEGORY 4:  Suspicious abnormality - biopsy should be considered.   Original Report Authenticated By: Baird Lyons, M.D.     Mm Digital Diag Ltd R  07/31/2012   **ADDENDUM** CREATED: 07/31/2012 10:26:45  Ultrasound of the right axilla is unremarkable.  Addended by:  Melton Alar. Micheline Maze, M.D. on 07/31/2012 10:26:45.  **END ADDENDUM** SIGNED BY: Melton Alar. Micheline Maze, M.D.  07/31/2012   *RADIOLOGY REPORT*  Clinical Data:  Patient presents for additional views of the right breast as follow-up to a recent screening exam suggesting a possible mass with adjacent microcalcifications.  DIGITAL DIAGNOSTIC RIGHT MAMMOGRAM  AND RIGHT BREAST ULTRASOUND:  Comparison:  07/11/2011, 07/07/2010, 07/02/2009, 06/26/2008, 06/25/2007, 06/22/2006 and 12/09/2005 as well as 05/30/2005  Findings:  ACR Breast Density Category 2: There is a scattered fibroglandular pattern.  Spot magnification images demonstrate an 8 mm ill-defined somewhat spiculated mass with a few associated microcalcifications over the outer mid right breast.  There is a 2 mm group of microcalcifications located 7 mm anterior to the spiculated mass. There are several other groups of microcalcifications within the upper outer right breast which are stable.  Ultrasound is performed, showing an irregular hypoechoic mass with shadowing at the 9 o'clock position of the right breast 5 cm from the nipple measuring 7 x 7 x 7 mm.  IMPRESSION: 7 x 7 x 7 mm irregular hypoechoic mass with shadowing at the 9 o'clock position of the right breast 5 cm from the nipple corresponding to the mammographic abnormality.  Adjacent group of indeterminate microcalcifications 7 mm anterior to this mass.  Two other stable groups of microcalcifications in the upper outer quadrant of the right breast.  RECOMMENDATION: Recommend ultrasound-guided core needle biopsy of this suspicious mass at the 9 o'clock position.  If biopsy benign, would recommend 75-month follow-up diagnostic right breast mammogram with magnification views to assess stability of the microcalcifications.  I have discussed the findings and recommendations with the  patient. Results were also provided in writing at the conclusion of the visit.  If applicable, a reminder letter will be sent to the patient regarding the next appointment.  BI-RADS CATEGORY 4:  Suspicious abnormality - biopsy should be considered.   Original Report Authenticated By: Elberta Fortis, M.D.   Mm Digital Diagnostic Unilat R  07/31/2012   *RADIOLOGY REPORT*  Clinical Data: The patient is post ultrasound guided core biopsy of a 7 mm spiculated mass over the outer mid right breast.  ULTRASOUND GUIDED POST-BIOPSY CLIP PLACEMENT RIGHT  Comparison:  Previous exams.  Findings: Exam demonstrates accurate placement of a bullet shaped secure mark clip over the biopsied mass in the outer mid right breast.  IMPRESSION: Satisfactory clip placement post ultrasound guided core right breast biopsy.   Original Report Authenticated By: Elberta Fortis, M.D.   Mm Digital Screening  07/18/2012   *  RADIOLOGY REPORT*  Clinical Data: Screening.  DIGITAL BILATERAL SCREENING MAMMOGRAM WITH CAD DIGITAL BREAST TOMOSYNTHESIS  Digital breast tomosynthesis images are acquired in two projections.  These images are reviewed in combination with the digital mammogram, confirming the findings below.  Comparison:  Previous exams.  FINDINGS:  ACR Breast Density Category 2: There is a scattered fibroglandular pattern.  In the right breast, calcifications and possible mass warrant further evaluation with magnified views with possible ultrasound. In the left breast, no mass or malignant type calcifications are identified.  Images were processed with CAD.  IMPRESSION: Further evaluation is suggested for calcifications and possible mass in the right breast.  RECOMMENDATION: Diagnostic mammogram and possible ultrasound of the right breast. (Code:FI-R-21M)  The patient will be contacted regarding the findings, and additional imaging will be scheduled.  BI-RADS CATEGORY 0:  Incomplete.  Need additional imaging evaluation and/or prior mammograms for  comparison.   Original Report Authenticated By: Harmon Pier, M.D.   Mm Radiologist Eval And Mgmt  08/01/2012   *RADIOLOGY REPORT*  Clinical Data: Status post biopsy in the right breast.  Patient is here for results and post biopsy examination.  CONSULTATION  Comparison: Prior films  Findings: The pathology revealed invasive ductal carcinoma.  This is found to be concordant with imaging findings.  I discussed the results with the patient and her husband and answered their questions.  The patient states she is doing well post biopsy with minimal tenderness at biopsy site.  I examined the biopsy site. The biopsy site is clean, soft without complications.  Recommend MRI of the breasts and surgical consultation.   The patient has appointment for MRI of the breasts on Aug 03 2012 and has appointment on Aug 08, 2012 for multidisciplinary cancer clinic.  IMPRESSION: Post biopsy consultation as described.   Original Report Authenticated By: Sherian Rein, M.D.   [place] Rt Breast Bx W Loc Dev 1st Lesion Img Bx Spec US Guide  07/31/2012   *RADIOLOGY REPORT*  Clinical Data:  Patient presents for ultrasound-guided core biopsy of a 7 mm irregular mass with shadowing at the 9 o'clock position of the right breast 5 cm from the nipple.  ULTRASOUND GUIDED VACUUM ASSISTED CORE BIOPSY OF THE RIGHT BREAST  Comparison: Previous exams.  I met with the patient and we discussed the procedure of ultrasound- guided biopsy, including benefits and alternatives.  We discussed the high likelihood of a successful procedure. We discussed the risks of the procedure including infection, bleeding, tissue injury, clip migration, and inadequate sampling.  Informed written consent was given.  Using sterile technique, 2% lidocaine ultrasound guidance and a 12 gauge vacuum assisted needle biopsy was performed of the targeted mass at the 9 o'clock position using a inferior to superior approach.  At the conclusion of the procedure, a bullet shaped secure-mark  clip was deployed into the biopsy cavity.  Follow-up 2- view mammogram was performed and dictated separately.  IMPRESSION: Ultrasound-guided biopsy of of a 7 mm irregular mass at the 9 o'clock position of the right breast.  No apparent complications.   Original Report Authenticated By: Elberta Fortis, M.D.      IMPRESSION: T1 C. N0 right breast cancer  PLAN: I discussed with the patient and her daughter the indications for radiation. We discussed the equivalency in terms of survival between breast conservation a mastectomy. We discussed the decrease local recurrence in patients who receive radiation after lumpectomy. We discussed that if the second biopsy is positive we may recommend mastectomy for cosmetic  purposes. It is unlikely she would require postmastectomy radiation and I think it would be appropriate for her to have immediate reconstruction if this is ultimately her surgical intervention. We discussed that she would likely have Oncotype testing with Dr. Welton Flakes. We discussed that if she did require chemotherapy that would be performed before radiation. We discussed the process of simulation the placement tattoos. We discussed 6 weeks of treatment as an outpatient. I offered her referral closer to home. She is going to decide whether she would like to receive treatment here or in [place]. We discussed possible side effects of treatment including but not limited to skin redness and fatigue. We discussed the possibility of rib and lung damage. She was a member of our patient family support staff as well as a member of our physical therapy staff. Her biopsy is scheduled for the 21st at 7:15 in the morning. We'll plan on seeing her back after surgery or would be happy to facilitate referral to a radiation oncologist in [facility]. I spent 40 minutes  face to face with the patient and more than 50% of that time was spent in counseling and/or coordination of care.     ------------------------------------------------  Lurline Hare, MD

## 2012-08-09 ENCOUNTER — Encounter: Payer: Self-pay | Admitting: *Deleted

## 2012-08-09 NOTE — Progress Notes (Signed)
[facility] Psychosocial Distress Screening Clinical Social Work  Patient completed distress screening protocol, and scored a 7 on the Psychosocial Distress Thermometer which indicates moderate distress. Clinical Social Worker in [facility] to assess for distress and other psychosocial needs.  Pt stated she felt a lower level of distress after speaking with the physicians and gaining more information on her treatment plan.  CSW informed pt of the support team and support services at [facility], and encouraged pt to call with any questions or concerns.     Tamala Julian, MSW, LCSW Clinical Social Worker [facility] [phone]

## 2012-08-13 ENCOUNTER — Telehealth: Payer: Self-pay | Admitting: *Deleted

## 2012-08-13 NOTE — Telephone Encounter (Signed)
Spoke to pt concerning [facility] from 08/08/12.  Pt relate she has changed her mind and would like bilateral mastectomies with reconstruction.  MRI bx cancelled at [facility].  Called Dr. Maris Berger office for appt on 08/23/12 at 2:30pm.  Confirmed appt date and time.  Pt denies further questions or concerns regarding dx or treatment care plan.  Gave pt information on plastic surgeons.  Encourage pt to call with further needs.  Received verbal understanding.  Contact information given.

## 2012-08-14 ENCOUNTER — Telehealth: Payer: Self-pay | Admitting: *Deleted

## 2012-08-14 NOTE — Telephone Encounter (Signed)
Scheduled pt to see Dr. Odis Luster for plastic surgery referral on 08/21/12 at 2:30pm.  Confirmed appt date and time.

## 2012-08-15 ENCOUNTER — Other Ambulatory Visit: Payer: BC Managed Care – PPO

## 2012-08-23 ENCOUNTER — Other Ambulatory Visit (INDEPENDENT_AMBULATORY_CARE_PROVIDER_SITE_OTHER): Payer: Self-pay | Admitting: General Surgery

## 2012-08-23 ENCOUNTER — Telehealth: Payer: Self-pay | Admitting: *Deleted

## 2012-08-23 ENCOUNTER — Ambulatory Visit (INDEPENDENT_AMBULATORY_CARE_PROVIDER_SITE_OTHER): Payer: BC Managed Care – PPO | Admitting: General Surgery

## 2012-08-23 VITALS — BP 154/90 | HR 84 | Temp 98.6°F | Resp 12 | Ht 67.0 in | Wt 182.6 lb

## 2012-08-23 DIAGNOSIS — C50911 Malignant neoplasm of unspecified site of right female breast: Secondary | ICD-10-CM

## 2012-08-23 DIAGNOSIS — C50919 Malignant neoplasm of unspecified site of unspecified female breast: Secondary | ICD-10-CM

## 2012-08-23 NOTE — Patient Instructions (Signed)
Mastectomy, With or Without Reconstruction Mastectomy (removal of the breast) is a procedure most commonly used to treat cancer (tumor) of the breast. Different procedures are available for treatment. This depends on the stage of the tumor (abnormal growths). Discuss this with your caregiver, surgeon (a specialist for performing operations such as this), or oncologist (someone specialized in the treatment of cancer). With proper information, you can decide which treatment is best for you. Although the sound of the word cancer is frightening to all of [place], the new treatments and medications can be a source of reassurance and comfort. If there are things you are worried about, discuss them with your caregiver. He or she can help comfort you and your family. Some of the different procedures for treating breast cancer are:  A total mastectomy with sentinel lymph node biopsy also known as a complete or simple mastectomy. It involves removal of only the breast and few lymph nodes and the muscles are left in place.  In a lumpectomy, the lump is removed from the breast. This is the simplest form of surgical treatment. A sentinel lymph node biopsy may also be done. Additional treatment may be required. RISKS AND COMPLICATIONS The main problems that follow removal of the breast include:  Infection (germs start growing in the wound). This can usually be treated with antibiotics (medications that kill germs).  Lymphedema. This means the arm on the side of the breast that was operated on swells because the lymph (tissue fluid) cannot follow the main channels back into the body. This only occurs when the lymph nodes have had to be removed under the arm.  There may be some areas of numbness to the upper arm and around the incision (cut by the surgeon) in the breast. This happens because of the cutting of or damage to some of the nerves in the area. This is most often unavoidable.  There may be difficulty moving the  arm in a full range of motion (moving in all directions) following surgery. This usually improves with time following use and exercise.  Recurrence of breast cancer may happen with the very best of surgery and follow up treatment. Sometimes small cancer cells that cannot be seen with the naked eye have already spread at the time of surgery. When this happens other treatment is available. This treatment may be radiation, medications or a combination of both. RECONSTRUCTION Reconstruction of the breast may be done immediately if there is not going to be post-operative radiation. This surgery is done for cosmetic (improve appearance) purposes to improve the physical appearance after the operation. This may be done in two ways:  It can be done using a saline filled prosthetic (an artificial breast which is filled with salt water). Silicone breast implants are now re-approved by the FDA and are being commonly used.  Reconstruction can be done using the body's own muscle/fat/skin. Your caregiver will discuss your options with you. Depending upon your needs or choice, together you will be able to determine which procedure is best for you. Document Released: 12/07/2000 Document Revised: 12/07/2011 Document Reviewed: 07/31/2007 [facility] Patient Information 2014 [place], [place].

## 2012-08-23 NOTE — Progress Notes (Signed)
Patient ID: Sherry Stafford, female   DOB: 1955-11-19, 57 y.o.   MRN: [id number]  Chief Complaint  Patient presents with  . Routine Post Op    discuss surgical plan    HPI Sherry Stafford is a 57 y.o. female.   HPI  She has invasive right breast cancer and was contemplating breast conservation surgery versus mastectomy. She has decided that she would like to have bilateral mastectomies and reconstruction. She is going to see Dr. Odis Luster tomorrow to talk about the reconstruction.  She understands that she'll still need a sentinel lymph node biopsy.  Her husband is here with her.  Past Medical History  Diagnosis Date  . Hypertension   . Hyperlipidemia   . Depression   . Anxiety   . GERD (gastroesophageal reflux disease)   . Breast cancer   . Hot flashes     Past Surgical History  Procedure Laterality Date  . Appendectomy    . Tubal ligation    . Breast surgery Left 2006    open biopsy (benign)    Family History  Problem Relation Age of Onset  . Cancer Father   . Colon cancer Father     Social History History  Substance Use Topics  . Smoking status: Never Smoker   . Smokeless tobacco: Not on file  . Alcohol Use: 1.2 oz/week    2 Glasses of wine per week    No Known Allergies  Current Outpatient Prescriptions  Medication Sig Dispense Refill  . ACZONE 5 % topical gel       . aspirin 81 MG tablet Take 81 mg by mouth daily.      Marland Kitchen buPROPion (WELLBUTRIN SR) 150 MG 12 hr tablet Take 150 mg by mouth 2 (two) times daily.      . chlorpheniramine-HYDROcodone (TUSSIONEX) 10-8 MG/5ML LQCR       . co-enzyme Q-10 30 MG capsule Take 30 mg by mouth 3 (three) times daily.      Marland Kitchen DEXILANT 60 MG capsule       . fluticasone (FLONASE) 50 MCG/ACT nasal spray Place 2 sprays into the nose daily.      Marland Kitchen lisinopril-hydrochlorothiazide (PRINZIDE,ZESTORETIC) 10-12.5 MG per tablet Take 1 tablet by mouth daily.      Marland Kitchen omega-3 acid ethyl esters (LOVAZA) 1 G capsule Take 2 g by mouth daily.      .  rosuvastatin (CRESTOR) 5 MG tablet Take 5 mg by mouth daily.       No current facility-administered medications for this visit.    Review of Systems Review of Systems  Respiratory: Positive for cough.   Gastrointestinal:       She may be having some reflux.    Blood pressure 154/90, pulse 84, temperature 98.6 F (37 C), temperature source Temporal, resp. rate 12, height 5\' 7"  (1.702 m), weight 182 lb 9.6 oz (82.827 kg).  Physical Exam Physical Exam  Constitutional: She appears well-developed and well-nourished. No distress.  HENT:  Head: Normocephalic and atraumatic.  Cardiovascular: Normal rate and regular rhythm.   Pulmonary/Chest:  The breasts are symmetrical in size. There no palpable masses or suspicious skin changes. Ecchymosis from her biopsy site has resolved.  Abdominal: Soft.  Musculoskeletal:  No axillary adenopathy.  Lymphadenopathy:    She has no cervical adenopathy.    Data Reviewed Previous note  Assessment    Invasive right breast cancer. She has decided she wants bilateral mastectomies with reconstruction. She will need a right axillary sentinel  lymph node biopsy as well and she is aware of this.     Plan    We will arrange for the surgery when she has been seen by Dr. Odis Luster. I discussed the procedure and risks in detail again with her and her husband.        Oziah Vitanza J 08/23/2012, 3:29 PM

## 2012-08-23 NOTE — Telephone Encounter (Signed)
Pt is going to discuss surgery plan with Drs. Rosenbower and [employer].  Pt has not had surgery yet and cancelled f/u appt.  Will r/s f/u appt with Dr. Welton Flakes after surgery has been scheduled.  Pt aware.

## 2012-08-24 ENCOUNTER — Ambulatory Visit: Payer: BC Managed Care – PPO | Admitting: Oncology

## 2012-09-04 ENCOUNTER — Other Ambulatory Visit: Payer: Self-pay | Admitting: *Deleted

## 2012-09-04 ENCOUNTER — Encounter: Payer: Self-pay | Admitting: *Deleted

## 2012-09-04 ENCOUNTER — Telehealth: Payer: Self-pay | Admitting: Oncology

## 2012-09-04 DIAGNOSIS — C50919 Malignant neoplasm of unspecified site of unspecified female breast: Secondary | ICD-10-CM

## 2012-09-04 MED ORDER — TAMOXIFEN CITRATE 20 MG PO TABS: 20.0000 mg | ORAL_TABLET | Freq: Every day | ORAL | Status: DC

## 2012-09-26 ENCOUNTER — Encounter (HOSPITAL_COMMUNITY): Payer: Self-pay | Admitting: Pharmacy Technician

## 2012-09-27 ENCOUNTER — Encounter: Payer: Self-pay | Admitting: Diagnostic Radiology

## 2012-10-03 NOTE — Pre-Procedure Instructions (Signed)
LIANNI KANAAN  10/03/2012   Your procedure is scheduled on:  Thursday October 11, 2012  Report to [facility] at 0915 AM.  Call this number if you have problems the morning of surgery: [phone]   Remember:   Do not eat food or drink liquids after midnight.   Take these medicines the morning of surgery with A SIP OF WATER: Amlodipine and Dexilant.  Discontinue Aspirin, Coumadin, Plavix, Effient, and any herbal meds 5 days prior to surgery.   Do not wear jewelry, make-up or nail polish.  Do not wear lotions, powders, or perfumes. You may wear deodorant.  Do not shave 48 hours prior to surgery.   Do not bring valuables to the hospital.  [facility] is not responsible                   for any belongings or valuables.  Contacts, dentures or bridgework may not be worn into surgery.  Leave suitcase in the car. After surgery it may be brought to your room.  For patients admitted to the hospital, checkout time is 11:00 AM the day of  discharge.   Patients discharged the day of surgery will not be allowed to drive  home.    Special Instructions: Shower using CHG 2 nights before surgery and the night before surgery.  If you shower the day of surgery use CHG.  Use special wash - you have one bottle of CHG for all showers.  You should use approximately 1/3 of the bottle for each shower.   Please read over the following fact sheets that you were given: Pain Booklet, Coughing and Deep Breathing and Surgical Site Infection Prevention

## 2012-10-04 ENCOUNTER — Encounter (HOSPITAL_COMMUNITY)
Admission: RE | Admit: 2012-10-04 | Discharge: 2012-10-04 | Disposition: A | Discharge status: Home / Self Care | Payer: BC Managed Care – PPO | Source: Ambulatory Visit | Attending: General Surgery | Admitting: General Surgery

## 2012-10-04 ENCOUNTER — Encounter (HOSPITAL_COMMUNITY): Payer: Self-pay

## 2012-10-04 DIAGNOSIS — Z01810 Encounter for preprocedural cardiovascular examination: Secondary | ICD-10-CM | POA: Insufficient documentation

## 2012-10-04 DIAGNOSIS — Z01812 Encounter for preprocedural laboratory examination: Secondary | ICD-10-CM | POA: Insufficient documentation

## 2012-10-04 DIAGNOSIS — Z01818 Encounter for other preprocedural examination: Secondary | ICD-10-CM | POA: Insufficient documentation

## 2012-10-04 HISTORY — DX: Unspecified osteoarthritis, unspecified site: M19.90

## 2012-10-04 LAB — COMPREHENSIVE METABOLIC PANEL
ALT: 16 U/L (ref 0–35)
AST: 15 U/L (ref 0–37)
Albumin: 3.8 g/dL (ref 3.5–5.2)
Alkaline Phosphatase: 55 U/L (ref 39–117)
BUN: 13 mg/dL (ref 6–23)
CO2: 26 mEq/L (ref 19–32)
Calcium: 10.1 mg/dL (ref 8.4–10.5)
Chloride: 107 mEq/L (ref 96–112)
Creatinine, Ser: 0.94 mg/dL (ref 0.50–1.10)
GFR calc Af Amer: 76 mL/min — ABNORMAL LOW (ref >90)
GFR calc non Af Amer: 66 mL/min — ABNORMAL LOW (ref >90)
Glucose, Bld: 87 mg/dL (ref 70–99)
Potassium: 3.9 mEq/L (ref 3.5–5.1)
Sodium: 142 mEq/L (ref 135–145)
Total Bilirubin: 0.2 mg/dL — ABNORMAL LOW (ref 0.3–1.2)
Total Protein: 7.2 g/dL (ref 6.0–8.3)

## 2012-10-04 LAB — CBC WITH DIFFERENTIAL/PLATELET
Basophils Absolute: 0 10*3/uL (ref 0.0–0.1)
Basophils Relative: 1 % (ref 0–1)
Eosinophils Absolute: 0.1 10*3/uL (ref 0.0–0.7)
Eosinophils Relative: 1 % (ref 0–5)
HCT: 42.1 % (ref 36.0–46.0)
Hemoglobin: 14.4 g/dL (ref 12.0–15.0)
Lymphocytes Relative: 42 % (ref 12–46)
Lymphs Abs: 2.1 10*3/uL (ref 0.7–4.0)
MCH: 31.1 pg (ref 26.0–34.0)
MCHC: 34.2 g/dL (ref 30.0–36.0)
MCV: 90.9 fL (ref 78.0–100.0)
Monocytes Absolute: 0.4 10*3/uL (ref 0.1–1.0)
Monocytes Relative: 9 % (ref 3–12)
Neutro Abs: 2.3 10*3/uL (ref 1.7–7.7)
Neutrophils Relative %: 47 % (ref 43–77)
Platelets: 237 10*3/uL (ref 150–400)
RBC: 4.63 MIL/uL (ref 3.87–5.11)
RDW: 13.5 % (ref 11.5–15.5)
WBC: 5 10*3/uL (ref 4.0–10.5)

## 2012-10-04 LAB — PROTIME-INR
INR: 1 (ref 0.00–1.49)
Prothrombin Time: 13 seconds (ref 11.6–15.2)

## 2012-10-04 LAB — ABO/RH: ABO/RH(D): A POS

## 2012-10-04 NOTE — Progress Notes (Signed)
LEft msg. Building control surveyor) for [employer]  At [place]. Med. Requesting prev. EKG

## 2012-10-04 NOTE — Pre-Procedure Instructions (Signed)
Sherry Stafford  10/04/2012   Your procedure is scheduled on:  10/11/12  Report to [facility] at 9:15 AM.  Call this number if you have problems the morning of surgery: [phone]   Remember:   Do not eat food or drink liquids after midnight. On Wednesday   Take these medicines the morning of surgery with A SIP OF WATER: amlodipine ,wellbutrin, dexilant, flonase as needed     Do not wear jewelry, make-up or nail polish.  Do not wear lotions, powders, or perfumes. You may wear deodorant.  Do not shave 48 hours prior to surgery.  Do not bring valuables to the hospital.  [facility] is not responsible  for any belongings or valuables.  Contacts, dentures or bridgework may not be worn into surgery.  Leave suitcase in the car. After surgery it may be brought to your room.  For patients admitted to the hospital, checkout time is 11:00 AM the day of  discharge.   Patients discharged the day of surgery will not be allowed to drive  home.  Name and phone number of your driver: /w family  Special Instructions: Shower using CHG 2 nights before surgery and the night before surgery.  If you shower the day of surgery use CHG.  Use special wash - you have one bottle of CHG for all showers.  You should use approximately 1/3 of the bottle for each shower.   Please read over the following fact sheets that you were given: Pain Booklet, Coughing and Deep Breathing, Blood Transfusion Information, MRSA Information and Surgical Site Infection Prevention

## 2012-10-04 NOTE — Progress Notes (Signed)
Sent request to Dr. Juleen China for prev. cxr result.

## 2012-10-05 ENCOUNTER — Other Ambulatory Visit: Payer: Self-pay | Admitting: Plastic Surgery

## 2012-10-05 LAB — TYPE AND SCREEN
ABO/RH(D): A POS
Antibody Screen: NEGATIVE

## 2012-10-05 NOTE — Progress Notes (Signed)
Anesthesia chart review: Patient is a 57 year old female scheduled for bilateral the right sentinel lymph node biopsy (Dr. Abbey Chatters) and breast reconstruction with placement of tissue expander and Flex HD (Dr. Odis Luster) on 10/11/12.  History includes breast cancer, nonsmoker, hypertension, hyperlipidemia, GERD, arthritis, anxiety, depression, appendectomy. PCP is Dr. Juleen China who is aware of upcoming surgery.    EKG on 10/04/12 showed NSR, poor anterior r wave progression.  (Low r wave in V3 is new since her prior EKG from Dr. Juleen China done on 01/08/98.)  No CV symptoms were documented at her PAT visit.  Preoperative labs noted.  CXR was requested from Dr. Juleen China, but is still pending.  Nursing staff to follow-up.  If patient remains asymptomatic from a CV standpoint then I would anticipate that she could proceed as planned.  Velna Ochs [facility]/Anesthesiology Phone [phone] 10/05/2012 11:25 AM

## 2012-10-06 ENCOUNTER — Encounter: Payer: Self-pay | Admitting: *Deleted

## 2012-10-06 NOTE — Progress Notes (Signed)
Mailed after appt letter to pt. 

## 2012-10-10 MED ORDER — CEFAZOLIN SODIUM-DEXTROSE 2-3 GM-% IV SOLR
2.0000 g | INTRAVENOUS | Status: AC
  Administered 2012-10-11: 2 g via INTRAVENOUS
  Administered 2012-10-11: 1 g via INTRAVENOUS
  Filled 2012-10-10: qty 50

## 2012-10-11 ENCOUNTER — Ambulatory Visit (HOSPITAL_COMMUNITY): Payer: BC Managed Care – PPO

## 2012-10-11 ENCOUNTER — Encounter (HOSPITAL_COMMUNITY)
Admission: RE | Disposition: A | Discharge status: Home / Self Care | Payer: Self-pay | Source: Ambulatory Visit | Attending: General Surgery

## 2012-10-11 ENCOUNTER — Encounter (HOSPITAL_COMMUNITY)
Admission: RE | Admit: 2012-10-11 | Discharge: 2012-10-11 | Disposition: A | Discharge status: Home / Self Care | Payer: BC Managed Care – PPO | Source: Ambulatory Visit | Attending: General Surgery | Admitting: General Surgery

## 2012-10-11 ENCOUNTER — Ambulatory Visit (HOSPITAL_COMMUNITY): Payer: BC Managed Care – PPO | Admitting: Anesthesiology

## 2012-10-11 ENCOUNTER — Encounter (HOSPITAL_COMMUNITY): Payer: Self-pay | Admitting: Anesthesiology

## 2012-10-11 ENCOUNTER — Encounter (HOSPITAL_COMMUNITY): Payer: Self-pay | Admitting: Vascular Surgery

## 2012-10-11 ENCOUNTER — Inpatient Hospital Stay (HOSPITAL_COMMUNITY)
Admission: RE | Admit: 2012-10-11 | Discharge: 2012-10-16 | DRG: 564 | Disposition: A | Discharge status: Home / Self Care | Payer: BC Managed Care – PPO | Source: Ambulatory Visit | Attending: General Surgery | Admitting: General Surgery

## 2012-10-11 DIAGNOSIS — J9383 Other pneumothorax: Secondary | ICD-10-CM | POA: Diagnosis present

## 2012-10-11 DIAGNOSIS — I10 Essential (primary) hypertension: Secondary | ICD-10-CM | POA: Diagnosis present

## 2012-10-11 DIAGNOSIS — N6089 Other benign mammary dysplasias of unspecified breast: Secondary | ICD-10-CM

## 2012-10-11 DIAGNOSIS — F411 Generalized anxiety disorder: Secondary | ICD-10-CM | POA: Diagnosis present

## 2012-10-11 DIAGNOSIS — E785 Hyperlipidemia, unspecified: Secondary | ICD-10-CM | POA: Diagnosis present

## 2012-10-11 DIAGNOSIS — Z7982 Long term (current) use of aspirin: Secondary | ICD-10-CM

## 2012-10-11 DIAGNOSIS — F3289 Other specified depressive episodes: Secondary | ICD-10-CM | POA: Diagnosis present

## 2012-10-11 DIAGNOSIS — K219 Gastro-esophageal reflux disease without esophagitis: Secondary | ICD-10-CM | POA: Diagnosis present

## 2012-10-11 DIAGNOSIS — C50919 Malignant neoplasm of unspecified site of unspecified female breast: Principal | ICD-10-CM | POA: Diagnosis present

## 2012-10-11 DIAGNOSIS — F329 Major depressive disorder, single episode, unspecified: Secondary | ICD-10-CM | POA: Diagnosis present

## 2012-10-11 DIAGNOSIS — C50511 Malignant neoplasm of lower-outer quadrant of right female breast: Secondary | ICD-10-CM | POA: Diagnosis present

## 2012-10-11 DIAGNOSIS — N6019 Diffuse cystic mastopathy of unspecified breast: Secondary | ICD-10-CM

## 2012-10-11 DIAGNOSIS — C50911 Malignant neoplasm of unspecified site of right female breast: Secondary | ICD-10-CM

## 2012-10-11 HISTORY — PX: MASTECTOMY W/ SENTINEL NODE BIOPSY: SHX2001

## 2012-10-11 HISTORY — PX: TOTAL MASTECTOMY: SHX6129

## 2012-10-11 HISTORY — PX: BREAST RECONSTRUCTION WITH PLACEMENT OF TISSUE EXPANDER AND FLEX HD (ACELLULAR HYDRATED DERMIS): SHX6295

## 2012-10-11 SURGERY — MASTECTOMY WITH SENTINEL LYMPH NODE BIOPSY
Anesthesia: General | Site: Chest | Laterality: Right | Wound class: Clean

## 2012-10-11 MED ORDER — PHENYLEPHRINE HCL 10 MG/ML IJ SOLN
10.0000 mg | INTRAVENOUS | Status: DC | PRN
  Administered 2012-10-11: 25 ug/min via INTRAVENOUS

## 2012-10-11 MED ORDER — METHYLENE BLUE 1 % INJ SOLN
INTRAMUSCULAR | Status: AC
  Filled 2012-10-11: qty 10

## 2012-10-11 MED ORDER — HYDROMORPHONE 0.3 MG/ML IV SOLN
INTRAVENOUS | Status: AC
  Filled 2012-10-11: qty 25

## 2012-10-11 MED ORDER — CHLORHEXIDINE GLUCONATE 4 % EX LIQD
1.0000 | Freq: Once | CUTANEOUS | Status: DC
Start: 2012-10-11 — End: 2012-10-11

## 2012-10-11 MED ORDER — BUPROPION HCL ER (XL) 150 MG PO TB24
150.0000 mg | ORAL_TABLET | Freq: Every day | ORAL | Status: DC
  Administered 2012-10-12 – 2012-10-16 (×5): 150 mg via ORAL
  Filled 2012-10-11 (×6): qty 1

## 2012-10-11 MED ORDER — TAMOXIFEN CITRATE 20 MG PO TABS
20.0000 mg | ORAL_TABLET | Freq: Every day | ORAL | Status: DC
  Administered 2012-10-12 – 2012-10-16 (×5): 20 mg via ORAL
  Filled 2012-10-11 (×7): qty 1

## 2012-10-11 MED ORDER — OXYCODONE HCL 5 MG PO TABS: 5.0000 mg | ORAL_TABLET | Freq: Once | ORAL | Status: DC | PRN

## 2012-10-11 MED ORDER — SODIUM CHLORIDE 0.9 % IJ SOLN
INTRAMUSCULAR | Status: DC | PRN
  Administered 2012-10-11: 13:00:00 via INTRAMUSCULAR

## 2012-10-11 MED ORDER — ONDANSETRON HCL 4 MG/2ML IJ SOLN: 4.0000 mg | Freq: Four times a day (QID) | INTRAMUSCULAR | Status: DC | PRN

## 2012-10-11 MED ORDER — PROMETHAZINE HCL 25 MG/ML IJ SOLN
6.2500 mg | INTRAMUSCULAR | Status: DC | PRN
  Filled 2012-10-11: qty 1

## 2012-10-11 MED ORDER — DEXTROSE-NACL 5-0.45 % IV SOLN
INTRAVENOUS | Status: DC
  Administered 2012-10-11 – 2012-10-15 (×4): via INTRAVENOUS

## 2012-10-11 MED ORDER — HEPARIN SODIUM (PORCINE) 5000 UNIT/ML IJ SOLN
5000.0000 [IU] | Freq: Once | INTRAMUSCULAR | Status: AC
  Administered 2012-10-11: 5000 [IU] via SUBCUTANEOUS

## 2012-10-11 MED ORDER — NEOSTIGMINE METHYLSULFATE 1 MG/ML IJ SOLN
INTRAMUSCULAR | Status: DC | PRN
  Administered 2012-10-11: 3 mg via INTRAVENOUS

## 2012-10-11 MED ORDER — DIPHENHYDRAMINE HCL 12.5 MG/5ML PO ELIX: 12.5000 mg | ORAL_SOLUTION | Freq: Four times a day (QID) | ORAL | Status: DC | PRN

## 2012-10-11 MED ORDER — FENTANYL CITRATE 0.05 MG/ML IJ SOLN: 50.0000 ug | INTRAMUSCULAR | Status: DC | PRN

## 2012-10-11 MED ORDER — CEFAZOLIN SODIUM 1-5 GM-% IV SOLN
1.0000 g | Freq: Three times a day (TID) | INTRAVENOUS | Status: DC
  Administered 2012-10-11 – 2012-10-13 (×5): 1 g via INTRAVENOUS
  Filled 2012-10-11 (×7): qty 50

## 2012-10-11 MED ORDER — CEFAZOLIN SODIUM 1-5 GM-% IV SOLN
INTRAVENOUS | Status: AC
  Filled 2012-10-11: qty 50

## 2012-10-11 MED ORDER — SODIUM CHLORIDE 0.9 % IJ SOLN: 9.0000 mL | INTRAMUSCULAR | Status: DC | PRN

## 2012-10-11 MED ORDER — METHOCARBAMOL 500 MG PO TABS
500.0000 mg | ORAL_TABLET | Freq: Four times a day (QID) | ORAL | Status: DC | PRN
  Administered 2012-10-11 – 2012-10-14 (×7): 500 mg via ORAL
  Filled 2012-10-11 (×6): qty 1

## 2012-10-11 MED ORDER — PROPOFOL 10 MG/ML IV BOLUS
INTRAVENOUS | Status: DC | PRN
  Administered 2012-10-11: 200 mg via INTRAVENOUS
  Administered 2012-10-11: 30 mg via INTRAVENOUS

## 2012-10-11 MED ORDER — ATORVASTATIN CALCIUM 10 MG PO TABS
10.0000 mg | ORAL_TABLET | Freq: Every day | ORAL | Status: DC
  Filled 2012-10-11 (×6): qty 1

## 2012-10-11 MED ORDER — TECHNETIUM TC 99M SULFUR COLLOID FILTERED
1.0000 | Freq: Once | INTRAVENOUS | Status: AC | PRN
  Administered 2012-10-11: 1 via INTRADERMAL

## 2012-10-11 MED ORDER — METHOCARBAMOL 500 MG PO TABS
ORAL_TABLET | ORAL | Status: AC
  Filled 2012-10-11: qty 1

## 2012-10-11 MED ORDER — DIPHENHYDRAMINE HCL 50 MG/ML IJ SOLN: 12.5000 mg | Freq: Four times a day (QID) | INTRAMUSCULAR | Status: DC | PRN

## 2012-10-11 MED ORDER — HYDROMORPHONE HCL PF 1 MG/ML IJ SOLN
0.2500 mg | INTRAMUSCULAR | Status: DC | PRN
  Administered 2012-10-11: 0.5 mg via INTRAVENOUS
  Administered 2012-10-11 (×2): 0.25 mg via INTRAVENOUS

## 2012-10-11 MED ORDER — PHENYLEPHRINE HCL 10 MG/ML IJ SOLN
INTRAMUSCULAR | Status: DC | PRN
  Administered 2012-10-11 (×9): 80 ug via INTRAVENOUS

## 2012-10-11 MED ORDER — FENTANYL CITRATE 0.05 MG/ML IJ SOLN
INTRAMUSCULAR | Status: AC
  Administered 2012-10-11: 100 ug via INTRAVENOUS
  Filled 2012-10-11: qty 2

## 2012-10-11 MED ORDER — HEPARIN SODIUM (PORCINE) 5000 UNIT/ML IJ SOLN
INTRAMUSCULAR | Status: AC
  Filled 2012-10-11: qty 1

## 2012-10-11 MED ORDER — ONDANSETRON HCL 4 MG/2ML IJ SOLN
INTRAMUSCULAR | Status: DC | PRN
  Administered 2012-10-11: 4 mg via INTRAVENOUS

## 2012-10-11 MED ORDER — LIDOCAINE HCL (CARDIAC) 20 MG/ML IV SOLN
INTRAVENOUS | Status: DC | PRN
  Administered 2012-10-11: 100 mg via INTRAVENOUS

## 2012-10-11 MED ORDER — OXYCODONE HCL 5 MG/5ML PO SOLN: 5.0000 mg | Freq: Once | ORAL | Status: DC | PRN

## 2012-10-11 MED ORDER — MIDAZOLAM HCL 2 MG/2ML IJ SOLN: 1.0000 mg | INTRAMUSCULAR | Status: DC | PRN

## 2012-10-11 MED ORDER — GLYCOPYRROLATE 0.2 MG/ML IJ SOLN
INTRAMUSCULAR | Status: DC | PRN
  Administered 2012-10-11: 0.4 mg via INTRAVENOUS
  Administered 2012-10-11: 0.2 mg via INTRAVENOUS

## 2012-10-11 MED ORDER — DOCUSATE SODIUM 100 MG PO CAPS
100.0000 mg | ORAL_CAPSULE | Freq: Every day | ORAL | Status: DC
  Administered 2012-10-11 – 2012-10-16 (×6): 100 mg via ORAL
  Filled 2012-10-11 (×6): qty 1

## 2012-10-11 MED ORDER — HYDROMORPHONE HCL PF 1 MG/ML IJ SOLN
INTRAMUSCULAR | Status: AC
  Filled 2012-10-11: qty 1

## 2012-10-11 MED ORDER — AMLODIPINE BESYLATE 5 MG PO TABS
5.0000 mg | ORAL_TABLET | Freq: Every day | ORAL | Status: DC
  Administered 2012-10-13 – 2012-10-15 (×3): 5 mg via ORAL
  Filled 2012-10-11 (×6): qty 1

## 2012-10-11 MED ORDER — PANTOPRAZOLE SODIUM 40 MG PO TBEC
40.0000 mg | DELAYED_RELEASE_TABLET | Freq: Every day | ORAL | Status: DC
  Administered 2012-10-11 – 2012-10-16 (×6): 40 mg via ORAL
  Filled 2012-10-11 (×6): qty 1

## 2012-10-11 MED ORDER — ROCURONIUM BROMIDE 100 MG/10ML IV SOLN
INTRAVENOUS | Status: DC | PRN
  Administered 2012-10-11 (×2): 50 mg via INTRAVENOUS
  Administered 2012-10-11 (×3): 10 mg via INTRAVENOUS

## 2012-10-11 MED ORDER — HYDROMORPHONE 0.3 MG/ML IV SOLN
INTRAVENOUS | Status: DC
  Administered 2012-10-11: 18:00:00 via INTRAVENOUS
  Administered 2012-10-12: 2.99 mg via INTRAVENOUS
  Administered 2012-10-12: 0.999 mg via INTRAVENOUS

## 2012-10-11 MED ORDER — LACTATED RINGERS IV SOLN
INTRAVENOUS | Status: DC | PRN
  Administered 2012-10-11 (×3): via INTRAVENOUS

## 2012-10-11 MED ORDER — NALOXONE HCL 0.4 MG/ML IJ SOLN: 0.4000 mg | INTRAMUSCULAR | Status: DC | PRN

## 2012-10-11 MED ORDER — SODIUM CHLORIDE 0.9 % IV SOLN
INTRAVENOUS | Status: DC | PRN
  Administered 2012-10-11: 500 mL via INTRAMUSCULAR

## 2012-10-11 MED ORDER — FENTANYL CITRATE 0.05 MG/ML IJ SOLN
INTRAMUSCULAR | Status: DC | PRN
  Administered 2012-10-11: 100 ug via INTRAVENOUS
  Administered 2012-10-11 (×5): 50 ug via INTRAVENOUS

## 2012-10-11 MED ORDER — MIDAZOLAM HCL 2 MG/2ML IJ SOLN
INTRAMUSCULAR | Status: AC
  Administered 2012-10-11: 2 mg via INTRAVENOUS
  Filled 2012-10-11: qty 2

## 2012-10-11 MED ORDER — LACTATED RINGERS IV SOLN
INTRAVENOUS | Status: DC
  Administered 2012-10-11: 10:00:00 via INTRAVENOUS

## 2012-10-11 MED ORDER — MIDAZOLAM HCL 5 MG/5ML IJ SOLN
INTRAMUSCULAR | Status: DC | PRN
  Administered 2012-10-11: 2 mg via INTRAVENOUS

## 2012-10-11 MED ORDER — HEPARIN SODIUM (PORCINE) 5000 UNIT/ML IJ SOLN
5000.0000 [IU] | Freq: Three times a day (TID) | INTRAMUSCULAR | Status: DC
  Administered 2012-10-12 – 2012-10-13 (×4): 5000 [IU] via SUBCUTANEOUS
  Filled 2012-10-11 (×8): qty 1

## 2012-10-11 SURGICAL SUPPLY — 78 items
ADH SKN CLS APL DERMABOND .7 (GAUZE/BANDAGES/DRESSINGS) ×9
APL SKNCLS STERI-STRIP NONHPOA (GAUZE/BANDAGES/DRESSINGS)
APPLIER CLIP 9.375 MED OPEN (MISCELLANEOUS) ×4
APR CLP MED 9.3 20 MLT OPN (MISCELLANEOUS) ×3
ATCH SMKEVC FLXB CAUT HNDSWH (FILTER) ×3 IMPLANT
BAG DECANTER FOR FLEXI CONT (MISCELLANEOUS) ×4 IMPLANT
BENZOIN TINCTURE PRP APPL 2/3 (GAUZE/BANDAGES/DRESSINGS) IMPLANT
BINDER BREAST LRG (GAUZE/BANDAGES/DRESSINGS) IMPLANT
BINDER BREAST XLRG (GAUZE/BANDAGES/DRESSINGS) IMPLANT
BINDER BREAST XXLRG (GAUZE/BANDAGES/DRESSINGS) ×1 IMPLANT
BIOPATCH RED 1 DISK 7.0 (GAUZE/BANDAGES/DRESSINGS) ×10 IMPLANT
CANISTER SUCTION 2500CC (MISCELLANEOUS) ×8 IMPLANT
CHLORAPREP W/TINT 26ML (MISCELLANEOUS) ×8 IMPLANT
CLIP APPLIE 9.375 MED OPEN (MISCELLANEOUS) ×3 IMPLANT
CLIP TI MEDIUM 6 (CLIP) ×2 IMPLANT
CLIP TI WIDE RED SMALL 6 (CLIP) ×1 IMPLANT
CLOTH BEACON ORANGE TIMEOUT ST (SAFETY) ×8 IMPLANT
CONT SPEC 4OZ CLIKSEAL STRL BL (MISCELLANEOUS) ×4 IMPLANT
COVER PROBE W GEL 5X96 (DRAPES) ×4 IMPLANT
COVER SURGICAL LIGHT HANDLE (MISCELLANEOUS) ×8 IMPLANT
DERMABOND ADVANCED (GAUZE/BANDAGES/DRESSINGS) ×3
DERMABOND ADVANCED .7 DNX12 (GAUZE/BANDAGES/DRESSINGS) ×6 IMPLANT
DRAIN CHANNEL 19F RND (DRAIN) ×15 IMPLANT
DRAPE CHEST BREAST 15X10 FENES (DRAPES) ×4 IMPLANT
DRAPE ORTHO SPLIT 77X108 STRL (DRAPES) ×8
DRAPE PROXIMA HALF (DRAPES) ×12 IMPLANT
DRAPE SURG 17X23 STRL (DRAPES) ×8 IMPLANT
DRAPE SURG ORHT 6 SPLT 77X108 (DRAPES) ×6 IMPLANT
DRAPE UTILITY 15X26 W/TAPE STR (DRAPE) ×8 IMPLANT
DRAPE WARM FLUID 44X44 (DRAPE) ×4 IMPLANT
DRSG PAD ABDOMINAL 8X10 ST (GAUZE/BANDAGES/DRESSINGS) ×7 IMPLANT
DRSG TEGADERM 4X4.75 (GAUZE/BANDAGES/DRESSINGS) ×9 IMPLANT
ELECT BLADE 6.5 EXT (BLADE) IMPLANT
ELECT CAUTERY BLADE 6.4 (BLADE) ×8 IMPLANT
ELECT REM PT RETURN 9FT ADLT (ELECTROSURGICAL) ×8
ELECTRODE REM PT RTRN 9FT ADLT (ELECTROSURGICAL) ×6 IMPLANT
EVACUATOR SILICONE 100CC (DRAIN) ×16 IMPLANT
EVACUATOR SMOKE ACCUVAC VALLEY (FILTER) ×1
EXPANDER TISSUE 800CC (Breast) ×2 IMPLANT
GLOVE BIO SURGEON STRL SZ7.5 (GLOVE) ×4 IMPLANT
GLOVE BIOGEL PI IND STRL 8 (GLOVE) ×6 IMPLANT
GLOVE BIOGEL PI INDICATOR 8 (GLOVE) ×2
GLOVE ECLIPSE 8.0 STRL XLNG CF (GLOVE) ×4 IMPLANT
GOWN STRL NON-REIN LRG LVL3 (GOWN DISPOSABLE) ×16 IMPLANT
GOWN STRL REIN XL XLG (GOWN DISPOSABLE) ×4 IMPLANT
KIT BASIN OR (CUSTOM PROCEDURE TRAY) ×8 IMPLANT
KIT ROOM TURNOVER OR (KITS) ×8 IMPLANT
MARKER SKIN DUAL TIP RULER LAB (MISCELLANEOUS) ×4 IMPLANT
NDL 18GX1X1/2 (RX/OR ONLY) (NEEDLE) IMPLANT
NDL 21 GA WING INFUSION (NEEDLE) IMPLANT
NDL HYPO 25GX1X1/2 BEV (NEEDLE) IMPLANT
NEEDLE 18GX1X1/2 (RX/OR ONLY) (NEEDLE) IMPLANT
NEEDLE 21 GA WING INFUSION (NEEDLE) ×8 IMPLANT
NEEDLE HYPO 25GX1X1/2 BEV (NEEDLE) IMPLANT
NS IRRIG 1000ML POUR BTL (IV SOLUTION) ×12 IMPLANT
PACK GENERAL/GYN (CUSTOM PROCEDURE TRAY) ×8 IMPLANT
PAD ARMBOARD 7.5X6 YLW CONV (MISCELLANEOUS) ×8 IMPLANT
PREFILTER EVAC NS 1 1/3-3/8IN (MISCELLANEOUS) ×4 IMPLANT
SET ASEPTIC TRANSFER (MISCELLANEOUS) ×1 IMPLANT
SPECIMEN JAR X LARGE (MISCELLANEOUS) ×4 IMPLANT
SPONGE GAUZE 4X4 12PLY (GAUZE/BANDAGES/DRESSINGS) ×4 IMPLANT
STAPLER VISISTAT 35W (STAPLE) IMPLANT
STRIP CLOSURE SKIN 1/2X4 (GAUZE/BANDAGES/DRESSINGS) IMPLANT
SUT ETHILON 2 0 FS 18 (SUTURE) ×4 IMPLANT
SUT ETHILON 3 0 FSL (SUTURE) ×4 IMPLANT
SUT MNCRL AB 3-0 PS2 18 (SUTURE) ×21 IMPLANT
SUT MON AB 4-0 PC3 18 (SUTURE) ×4 IMPLANT
SUT PDS AB 3-0 SH 27 (SUTURE) IMPLANT
SUT PROLENE 3 0 PS 2 (SUTURE) ×8 IMPLANT
SUT VIC AB 3-0 SH 18 (SUTURE) ×8 IMPLANT
SUT VIC AB 3-0 SH 8-18 (SUTURE) ×2 IMPLANT
SYR BULB IRRIGATION 50ML (SYRINGE) ×4 IMPLANT
SYR CONTROL 10ML LL (SYRINGE) IMPLANT
TOWEL OR 17X24 6PK STRL BLUE (TOWEL DISPOSABLE) ×8 IMPLANT
TOWEL OR 17X26 10 PK STRL BLUE (TOWEL DISPOSABLE) ×8 IMPLANT
TRAY FOLEY CATH 14FRSI W/METER (CATHETERS) IMPLANT
TUBE CONNECTING 12X1/4 (SUCTIONS) ×4 IMPLANT
YANKAUER SUCT BULB TIP NO VENT (SUCTIONS) ×2 IMPLANT

## 2012-10-11 NOTE — Brief Op Note (Signed)
10/11/2012  5:39 PM  PATIENT:  Sherry Stafford  57 y.o. female  PRE-OPERATIVE DIAGNOSIS:  RIGHT BREAST CANCER   POST-OPERATIVE DIAGNOSIS:  RIGHT BREAST CANCER   PROCEDURE:  Procedure(s) with comments:  bilateral MASTECTOMY WITH right  SENTINEL LYMPH NODE BIOPSY (Right) - right nuclear medicine injection 10:45 dr Odis Luster to follow with reconstruction time alloted  TOTAL MASTECTOMY (Left) BREAST RECONSTRUCTION WITH PLACEMENT OF TISSUE EXPANDER AND FLEX HD (ACELLULAR HYDRATED DERMIS) (Bilateral)  SURGEON:  Surgeon(s) and Role: Panel 1:    * Adolph Pollack, MD - Primary  Panel 2:    * Etter Sjogren, MD - Primary  PHYSICIAN ASSISTANT:   ASSISTANTS: none   ANESTHESIA:   general  EBL:  Total I/O In: 2300 [I.V.:2300] Out: 465 [Urine:265; Blood:200]  BLOOD ADMINISTERED:none  DRAINS: (4) Jackson-Pratt drain(s) with closed bulb suction in the right chest (2) and left chest (2)   LOCAL MEDICATIONS USED:  NONE  SPECIMEN:  No Specimen  DISPOSITION OF SPECIMEN:  N/A  COUNTS:  YES  TOURNIQUET:  * No tourniquets in log *  DICTATION: .Other Dictation: Dictation Number [id number]  PLAN OF CARE: Admit to inpatient   PATIENT DISPOSITION:  PACU - hemodynamically stable.   Delay start of Pharmacological VTE agent (>24hrs) due to surgical blood loss or risk of bleeding: no

## 2012-10-11 NOTE — Progress Notes (Signed)
Pt using inspirometer pulling 1500

## 2012-10-11 NOTE — Progress Notes (Signed)
Dr. Jean Rosenthal called informed pt back on simple mask due to O2 sat dropping to 89 on cannula at 4/l

## 2012-10-11 NOTE — Transfer of Care (Signed)
Immediate Anesthesia Transfer of Care Note  Patient: Sherry Stafford  Procedure(s) Performed: Procedure(s) with comments:  bilateral MASTECTOMY WITH right  SENTINEL LYMPH NODE BIOPSY (Right) - right nuclear medicine injection 10:45 dr Odis Luster to follow with reconstruction time alloted  TOTAL MASTECTOMY (Left) BREAST RECONSTRUCTION WITH PLACEMENT OF TISSUE EXPANDER AND FLEX HD (ACELLULAR HYDRATED DERMIS) (Bilateral)  Patient Location: PACU  Anesthesia Type:General  Level of Consciousness: awake  Airway & Oxygen Therapy: Patient Spontanous Breathing and Patient connected to face mask oxygen  Post-op Assessment: Report given to PACU RN and Post -op Vital signs reviewed and stable  Post vital signs: Reviewed and stable  Complications: No apparent anesthesia complications

## 2012-10-11 NOTE — Preoperative (Signed)
Beta Blockers   Reason not to administer Beta Blockers:Not Applicable 

## 2012-10-11 NOTE — H&P (Signed)
Sherry Stafford is an 57 y.o. female.   Chief Complaint:   Here for elective surgery HPI: She has invasive right breast cancer and was contemplating breast conservation surgery versus mastectomy. She has decided that she would like to have bilateral mastectomies and reconstruction.  She present for that as well as right axillary SLNBx.   Past Medical History  Diagnosis Date  . Hyperlipidemia   . Depression   . Anxiety   . GERD (gastroesophageal reflux disease)   . Breast cancer   . Hot flashes   . Hypertension     stress test 5 yrs. ago or > , states it was wnl, no need for cardiac f/u, states her BP is managed by Dr. Juleen China  . Arthritis     C5-6- bone spurs- treated by Chiropratic , achiness in hips & legs     Past Surgical History  Procedure Laterality Date  . Tubal ligation    . Breast surgery Left 2006    open biopsy (benign), R breast- stereotactic core biopsy- R breast- 07/2012  . Appendectomy      as a teen   . Blepheroplasy      bilateral     Family History  Problem Relation Age of Onset  . Cancer Father   . Colon cancer Father    Social History:  reports that she has never smoked. She does not have any smokeless tobacco history on file. She reports that she drinks about 1.2 ounces of alcohol per week. She reports that she does not use illicit drugs.  Allergies: No Known Allergies  Medications Prior to Admission  Medication Sig Dispense Refill  . ACZONE 5 % topical gel Apply 1 application topically 2 (two) times daily as needed (for breakouts).       Marland Kitchen amLODipine (NORVASC) 5 MG tablet Take 5 mg by mouth daily with breakfast.       . aspirin 81 MG tablet Take 81 mg by mouth daily.      Marland Kitchen buPROPion (WELLBUTRIN XL) 150 MG 24 hr tablet Take 150 mg by mouth daily with breakfast.       . CALCIUM PO Take 0.5 tablets by mouth daily.      Marland Kitchen co-enzyme Q-10 30 MG capsule Take 30 mg by mouth daily.       Marland Kitchen DEXILANT 60 MG capsule Take 60 mg by mouth daily with breakfast.       .  fluticasone (FLONASE) 50 MCG/ACT nasal spray Place 2 sprays into the nose daily as needed for allergies.       Marland Kitchen ibuprofen (ADVIL,MOTRIN) 200 MG tablet Take 600 mg by mouth every 6 (six) hours as needed for pain.      . Multiple Vitamin (MULTIVITAMIN WITH MINERALS) TABS Take 0.5 tablets by mouth daily.      Marland Kitchen omega-3 acid ethyl esters (LOVAZA) 1 G capsule Take 1 g by mouth daily.       . rosuvastatin (CRESTOR) 5 MG tablet Take 5 mg by mouth daily with breakfast.       . tamoxifen (NOLVADEX) 20 MG tablet Take 20 mg by mouth daily with breakfast.        No results found for this or any previous visit (from the past 48 hour(s)). Dg Chest 2 View  10/11/2012   *RADIOLOGY REPORT*  Clinical Data: Bilateral mastectomy.  Hypertension.  CHEST - 2 VIEW  Comparison: 09/30/2004  Findings: Normal heart size.  No pleural effusion or edema.  No airspace  consolidation identified.  IMPRESSION:  1.  No acute cardiopulmonary abnormalities.   Original Report Authenticated By: Signa Kell, M.D.    Review of Systems  Constitutional: Negative for fever and chills.  Respiratory: Negative for cough.   Gastrointestinal: Negative for nausea, vomiting, abdominal pain and diarrhea.    Blood pressure 142/85, pulse 82, temperature 97.1 F (36.2 C), temperature source Oral, resp. rate 16, SpO2 100.00%. Physical Exam  Constitutional: She appears well-developed and well-nourished. No distress.  HENT:  Head: Normocephalic and atraumatic.  Cardiovascular: Normal rate and regular rhythm.   Respiratory: Effort normal and breath sounds normal.  No palpable breast masses.  GI: Soft. There is no tenderness.  Musculoskeletal: She exhibits no edema.  Lymphadenopathy:    She has no cervical adenopathy.  Neurological: She is alert.  Skin: Skin is warm and dry.     Assessment/Plan Invasive right breast cancer.  Plan:  Bilateral mastectomies, right axillary SLNBx, reconstruction (Dr. Odis Luster).  Sherry Stafford  J 10/11/2012, 11:18 AM

## 2012-10-11 NOTE — Progress Notes (Signed)
DR. Jean Rosenthal called informed difficulty holding pt's O2 sats using an inspirometer

## 2012-10-11 NOTE — Progress Notes (Signed)
report received from Letitia Libra

## 2012-10-11 NOTE — Interval H&P Note (Signed)
History and Physical Interval Note:  10/11/2012 11:20 AM  Erline Hau  has presented today for surgery, with the diagnosis of RIGHT BREAST CANCER   The various methods of treatment have been discussed with the patient and family. After consideration of risks, benefits and other options for treatment, the patient has consented to  Procedure(s) with comments:  bilateral MASTECTOMY WITH right  SENTINEL LYMPH NODE BIOPSY (Right) - right nuclear medicine injection 10:45 dr Odis Luster to follow with reconstruction time alloted  TOTAL MASTECTOMY (Left) BREAST RECONSTRUCTION WITH PLACEMENT OF TISSUE EXPANDER AND FLEX HD (ACELLULAR HYDRATED DERMIS) (Bilateral) as a surgical intervention .  The patient's history has been reviewed, patient examined, no change in status, stable for surgery.  I have reviewed the patient's chart and labs.  Questions were answered to the patient's satisfaction.     [place] [employer] [place]la [place]

## 2012-10-11 NOTE — Progress Notes (Signed)
PCA set up.

## 2012-10-11 NOTE — Progress Notes (Signed)
DR. Jean Rosenthal at bedside of patient no orders received

## 2012-10-11 NOTE — Anesthesia Preprocedure Evaluation (Signed)
Anesthesia Evaluation  Patient identified by MRN, date of birth, ID band Patient awake    Reviewed: Allergy & Precautions, H&P , NPO status , Patient's Chart, lab work & pertinent test results  Airway Mallampati: II  Neck ROM: full    Dental   Pulmonary          Cardiovascular hypertension,     Neuro/Psych Anxiety Depression    GI/Hepatic GERD-  ,  Endo/Other    Renal/GU      Musculoskeletal   Abdominal   Peds  Hematology   Anesthesia Other Findings   Reproductive/Obstetrics                           Anesthesia Physical Anesthesia Plan  ASA: II  Anesthesia Plan: General   Post-op Pain Management:    Induction: Intravenous  Airway Management Planned: LMA  Additional Equipment:   Intra-op Plan:   Post-operative Plan:   Informed Consent: I have reviewed the patients History and Physical, chart, labs and discussed the procedure including the risks, benefits and alternatives for the proposed anesthesia with the patient or authorized representative who has indicated his/her understanding and acceptance.     Plan Discussed with: CRNA, Anesthesiologist and Surgeon  Anesthesia Plan Comments:         Anesthesia Quick Evaluation

## 2012-10-11 NOTE — Progress Notes (Signed)
Started inspirometer pt pulling 800 difficulty holding it, instructed with teach back

## 2012-10-11 NOTE — Anesthesia Postprocedure Evaluation (Signed)
  Anesthesia Post-op Note  Patient: JAYDN FINCHER  Procedure(s) Performed: Procedure(s) with comments:  bilateral MASTECTOMY WITH right  SENTINEL LYMPH NODE BIOPSY (Right) - right nuclear medicine injection 10:45 dr Odis Luster to follow with reconstruction time alloted  TOTAL MASTECTOMY (Left) BREAST RECONSTRUCTION WITH PLACEMENT OF TISSUE EXPANDER AND FLEX HD (ACELLULAR HYDRATED DERMIS) (Bilateral)  Patient Location: PACU  Anesthesia Type:General  Level of Consciousness: awake, alert  and oriented  Airway and Oxygen Therapy: Patient Spontanous Breathing and Patient connected to nasal cannula oxygen  Post-op Pain: mild  Post-op Assessment: Post-op Vital signs reviewed, Patient's Cardiovascular Status Stable, Respiratory Function Stable, Patent Airway and Pain level controlled  Post-op Vital Signs: stable  Complications: No apparent anesthesia complications

## 2012-10-11 NOTE — Op Note (Signed)
Operative Note  Sherry Stafford female 57 y.o. 10/11/2012  PREOPERATIVE DX:  Invasive right breast cancer  POSTOPERATIVE DX:  Same  PROCEDURE:  Right axillary lymphatic mapping. Blue dye injection into right breast. Right axillary sentinel lymph node biopsy. Bilateral mastectomies.         Surgeon: Adolph Pollack   Assistants: Marca Ancona, M.D.  Anesthesia: General endotracheal anesthesia  Indications: This is a 57 year old female noted to have an abnormality on mammogram. Image guided biopsy was consistent with invasive breast cancer right breast. After being offered options she has chosen bilateral mastectomies with reconstruction and right axillary sentinel and biopsy. She presents for this.    Procedure Detail:  She was seen in the holding area and the right breast was marked my initials. She had injection of radioactive material into the right breast. She was then brought to the operating room, placed supine on the operating table, and a general anesthetic was given. A Foley catheter was inserted. Using a neoprobe, lymphatic mapping was performed to the right axilla. So weak counts were noted. The right periareolar area was then sterilely prepped. Methylene blue was injected in 4 quadrants of the right periareolar area.  The right breast was massaged. Both breasts, lateral chest walls, and an axillary areas were sterilely prepped and draped.  A transverse incision was made in the right axilla. The subcutaneous to tissue is divided with electrocautery. Using the neoprobe I found an area of increased counts which was very deep and inferior in the axilla. Using blunt dissection Iidentified a bundle of lymph nodes with increased radioactivity and removed these with electrocautery. Significantly elevated counts were noted. None of these lymph nodes were blue.  I then inspected the axilla and did not see any blue tinted lymph nodes. There were no more increased counts in the right axilla.  The 3 lymph nodes were then sent for touch prep. The touch prep was negative.  The subcutaneous tissue the right axillary wound was then closed with interrupted 3-0 Vicryl sutures. The skin was subsequently closed with 4-0 Monocryl were subcuticular stitch.  Next, the right breast was approached.  An elliptical incision was made through the skin and dermis to include the nipple areolar complex. Subcutaneous flaps were then raised using electrocautery superiorly to the clavicle, medially to the lateral border of the sternum, inferiorly to the anterior rectus sheath, and laterally to the latissimus muscle. The breast tissue was very dense. The breast tissue was then dissected off the pectoralis major muscle and fascia with electrocautery. The medial aspect of the breast was marked with a suture and passed off the field. The wound was irrigated with saline solution. Bleeding was controlled with electrocautery. Warm moistened laparotomy pad was then placed in the wound and the skin was closed loosely with staples.  Gloves and instruments were changed. The left breast was approached. An elliptical incision was made through the skin and dermis to include the nipple areolar complex. Subcutaneous flaps were raised superiorly to the clavicle, medially to the lateral border of the sternum, inferiorly to the anterior rectus sheath fascia, and laterally to the latissimus muscle using electrocautery. The left breast was also very dense. The left breast was then dissected free from the pectoralis major muscle and fascia using electrocautery. The medial aspect of the breast was marked with a suture and then the specimen was handed off the field.  The wound was irrigated and bleeding was controlled with electrocautery. Once hemostasis was adequate warm moist laparotomy sponges  were put into the wound and the skin was closed loosely with staples. Dr. Etter Sjogren then proceeded with reconstruction.  Estimated Blood Loss:   200 mL  Blood Given: none          Specimens: Right axillary sentinel lymph nodes. Right breast tissue. Left breast tissue.        Complications:  None apparent

## 2012-10-12 MED ORDER — METOCLOPRAMIDE HCL 5 MG/ML IJ SOLN
10.0000 mg | Freq: Three times a day (TID) | INTRAMUSCULAR | Status: DC | PRN
Start: 2012-10-12 — End: 2012-10-16
  Administered 2012-10-12 – 2012-10-14 (×2): 10 mg via INTRAVENOUS
  Filled 2012-10-12 (×2): qty 2

## 2012-10-12 MED ORDER — HYDROMORPHONE HCL 2 MG PO TABS
2.0000 mg | ORAL_TABLET | ORAL | Status: DC | PRN
  Administered 2012-10-12 – 2012-10-16 (×21): 4 mg via ORAL
  Filled 2012-10-12 (×21): qty 2

## 2012-10-12 NOTE — Progress Notes (Signed)
Subjective: Sore but good pain control. Tolerating liquids well.  Objective: Vital signs in last 24 hours: Temp:  [97 F (36.1 C)-98.1 F (36.7 C)] 97.6 F (36.4 C) (07/18 0544) Pulse Rate:  [60-90] 81 (07/18 0544) Resp:  [10-18] 18 (07/18 0544) BP: (91-142)/(57-98) 101/61 mmHg (07/18 0544) SpO2:  [88 %-100 %] 93 % (07/18 0544) FiO2 (%):  [94 %] 94 % (07/17 1807)  Intake/Output from previous day: 07/17 0701 - 07/18 0700 In: 3015 [I.V.:2815; IV Piggyback:50] Out: 1425 [Urine:1015; Drains:210; Blood:200] Intake/Output this shift:    Operative sites: Mastectomy flaps viable. Excellent color. Tissue expanders appear to be in good position. Drains functioning. Drainage thin.  No results found for this basename: WBC, HGB, HCT, PLATELETS, NA, K, CL, CO2, BUN, CREATININE, GLU,  in the last 72 hours  Studies/Results: Dg Chest 2 View  10/11/2012   *RADIOLOGY REPORT*  Clinical Data: Bilateral mastectomy.  Hypertension.  CHEST - 2 VIEW  Comparison: 09/30/2004  Findings: Normal heart size.  No pleural effusion or edema.  No airspace consolidation identified.  IMPRESSION:  1.  No acute cardiopulmonary abnormalities.   Original Report Authenticated By: Signa Kell, M.D.   Nm Sentinel Node Inj-no Rpt (breast)  10/11/2012   CLINICAL DATA: Invasive right breast cancer   Sulfur colloid was injected intradermally by the nuclear medicine  technologist for breast cancer sentinel node localization.     Assessment/Plan: D/C IV fluids. D/C PCA pain med. Begin PO dilaudid. Ambulate.   LOS: 1 day    Etter Sjogren M 10/12/2012 7:53 AM

## 2012-10-12 NOTE — Progress Notes (Signed)
1 Day Post-Op  Subjective: Very sore.  Tolerating liquids.  Objective: Vital signs in last 24 hours: Temp:  [97 F (36.1 C)-98.1 F (36.7 C)] 97.6 F (36.4 C) (07/18 0544) Pulse Rate:  [60-90] 81 (07/18 0544) Resp:  [10-18] 18 (07/18 0544) BP: (91-142)/(57-98) 101/61 mmHg (07/18 0544) SpO2:  [88 %-100 %] 93 % (07/18 0544) FiO2 (%):  [94 %] 94 % (07/17 1807) Last BM Date: 10/11/12 (Prior to surgery)  Intake/Output from previous day: 07/17 0701 - 07/18 0700 In: 3015 [I.V.:2815; IV Piggyback:50] Out: 1425 [Urine:1015; Drains:210; Blood:200] Intake/Output this shift:    PE: General- In NAD Chest-breast binder on, superior flap looks good  Lab Results:  No results found for this basename: WBC, HGB, HCT, PLT,  in the last 72 hours BMET No results found for this basename: NA, K, CL, CO2, GLUCOSE, BUN, CREATININE, CALCIUM,  in the last 72 hours PT/INR No results found for this basename: LABPROT, INR,  in the last 72 hours Comprehensive Metabolic Panel:    Component Value Date/Time   NA 142 10/04/2012 1034   K 3.9 10/04/2012 1034   CL 107 10/04/2012 1034   CO2 26 10/04/2012 1034   BUN 13 10/04/2012 1034   CREATININE 0.94 10/04/2012 1034   GLUCOSE 87 10/04/2012 1034   CALCIUM 10.1 10/04/2012 1034   AST 15 10/04/2012 1034   ALT 16 10/04/2012 1034   ALKPHOS 55 10/04/2012 1034   BILITOT 0.2* 10/04/2012 1034   PROT 7.2 10/04/2012 1034   ALBUMIN 3.8 10/04/2012 1034     Studies/Results: Dg Chest 2 View  10/11/2012   *RADIOLOGY REPORT*  Clinical Data: Bilateral mastectomy.  Hypertension.  CHEST - 2 VIEW  Comparison: 09/30/2004  Findings: Normal heart size.  No pleural effusion or edema.  No airspace consolidation identified.  IMPRESSION:  1.  No acute cardiopulmonary abnormalities.   Original Report Authenticated By: Signa Kell, M.D.   Nm Sentinel Node Inj-no Rpt (breast)  10/11/2012   CLINICAL DATA: Invasive right breast cancer   Sulfur colloid was injected intradermally by the  nuclear medicine  technologist for breast cancer sentinel node localization.     Anti-infectives: Anti-infectives   Start     Dose/Rate Route Frequency Ordered Stop   10/11/12 2200  ceFAZolin (ANCEF) IVPB 1 g/50 mL premix     1 g 100 mL/hr over 30 Minutes Intravenous 3 times per day 10/11/12 2014     10/11/12 0600  ceFAZolin (ANCEF) IVPB 2 g/50 mL premix     2 g 100 mL/hr over 30 Minutes Intravenous On call to O.R. 10/10/12 1420 10/11/12 1628      Assessment Principal Problem:   Breast cancer, right breast-T1Nx s/p bilateral mastectomies and right axillary SLNBx with reconstruction 10/11/12-stable overnight    LOS: 1 day   Plan: Hopefully home tomorrow.   Mattingly Fountaine J 10/12/2012

## 2012-10-12 NOTE — Op Note (Signed)
Sherry Stafford, Sherry NO.:  [id number]  MEDICAL RECORD NO.:  [id number]  LOCATION:  [facility]                          FACILITY:  [facility]  PHYSICIAN:  Etter Sjogren, M.D.     DATE OF BIRTH:  [birth year]  DATE OF PROCEDURE:  10/11/2012 DATE OF DISCHARGE:                              OPERATIVE REPORT   PREOPERATIVE DIAGNOSIS:  Breast cancer.  POSTOPERATIVE DIAGNOSIS:  Breast cancer.  PROCEDURE PERFORMED:  Bilateral breast reconstruction with tissue expander.  SURGEON:  Etter Sjogren, M.D.  ANESTHESIA:  General.  ESTIMATED BLOOD LOSS:  20 mL.  DRAINS:  Two 19-French drains on each side.  CLINICAL NOTE:  A 57 year old woman who has had breast cancer and is having bilateral mastectomies, she did desire reconstruction.  The options were discussed and she elected to have placement of tissue expanders immediate at the time of mastectomy followed as a stage procedure by removal of expander and placement of implant.  The nature of this procedure and risks and possible complications were discussed with her in detail.  Risks include, but not limited to, bleeding, infection, healing problems, scarring, loss of sensation, fluid accumulations, anesthesia complications, pneumothorax, DVT, pulmonary embolism, failure of device, capsular contracture, displacement of device, wrinkles, chronic pain, disappointment, and asymmetry, and she understood all of these and wished to proceed.  DESCRIPTION OF PROCEDURE:  The patient was in the operating room and General Surgery had completed bilateral mastectomies.  Skin flaps were inspected and found to have excellent color and bright red bleeding periphery consistent with viability.  The spaces were then irrigated thoroughly with saline and hemostasis with electrocautery.  The submuscular space was then developed deep to the pectoralis major muscle and a portion of the serratus anterior was also raised laterally. Dissection was continued  down to the inframammary crease, bilateral. Thorough irrigation with saline, antibiotic solution and again, an excellent hemostasis was confirmed.  After thoroughly cleaning gloves, the expanders were prepared, these were Mentor 800 mL tissue expanders, and they were soaked in antibiotic solution.  100 mL of sterile saline placed and this was placed using a closed filling system.  All the air removed and the expanders were returned to the antibiotic solution to soak.  Some antibiotic solution was placed to submuscular space and the spaces were again inspected and excellent hemostasis confirmed.  19- French drains were positioned, brought out through separate stab wounds inferolaterally and secured with 3-0 Prolene sutures.  The expanders were positioned and the muscle was closed using 3-0 Vicryl interrupted simple and figure-of-eight sutures.  The hemostasis again achieved using electrocautery and the skin closure with 3-0 Monocryl interrupted inverted deep dermal sutures.  Dermabond was applied to the wounds and Biopatch with Tegaderm for the drains and dry sterile dressings, and the breast vest was then positioned and she was transported to the recovery room stable having tolerated the procedure well.     Etter Sjogren, M.D.     DB/MEDQ  D:  10/11/2012  T:  10/12/2012  Job:  [id number]

## 2012-10-13 ENCOUNTER — Inpatient Hospital Stay (HOSPITAL_COMMUNITY): Payer: BC Managed Care – PPO

## 2012-10-13 MED ORDER — ENOXAPARIN SODIUM 40 MG/0.4ML ~~LOC~~ SOLN: 40.0000 mg | SUBCUTANEOUS | Status: DC

## 2012-10-13 MED ORDER — FENTANYL CITRATE 0.05 MG/ML IJ SOLN
INTRAMUSCULAR | Status: AC
  Filled 2012-10-13: qty 4

## 2012-10-13 MED ORDER — HYDROMORPHONE HCL 2 MG PO TABS: 2.0000 mg | ORAL_TABLET | ORAL | Status: DC | PRN

## 2012-10-13 MED ORDER — DSS 100 MG PO CAPS: 100.0000 mg | ORAL_CAPSULE | Freq: Two times a day (BID) | ORAL | Status: DC

## 2012-10-13 MED ORDER — MIDAZOLAM HCL 2 MG/2ML IJ SOLN
INTRAMUSCULAR | Status: AC
  Filled 2012-10-13: qty 4

## 2012-10-13 MED ORDER — ACETAMINOPHEN 325 MG PO TABS
650.0000 mg | ORAL_TABLET | ORAL | Status: DC | PRN
  Administered 2012-10-13: 650 mg via ORAL

## 2012-10-13 MED ORDER — ENOXAPARIN (LOVENOX) PATIENT EDUCATION KIT
PACK | Freq: Once | Status: AC
  Administered 2012-10-13: 10:00:00
  Filled 2012-10-13: qty 1

## 2012-10-13 MED ORDER — CEPHALEXIN 500 MG PO CAPS: 500.0000 mg | ORAL_CAPSULE | Freq: Two times a day (BID) | ORAL | Status: DC

## 2012-10-13 MED ORDER — ENOXAPARIN SODIUM 40 MG/0.4ML ~~LOC~~ SOLN
40.0000 mg | SUBCUTANEOUS | Status: DC
  Administered 2012-10-13 – 2012-10-15 (×3): 40 mg via SUBCUTANEOUS
  Filled 2012-10-13 (×4): qty 0.4

## 2012-10-13 MED ORDER — MIDAZOLAM HCL 2 MG/2ML IJ SOLN
INTRAMUSCULAR | Status: AC | PRN
  Administered 2012-10-13 (×2): 1 mg via INTRAVENOUS

## 2012-10-13 MED ORDER — METHOCARBAMOL 500 MG PO TABS: 500.0000 mg | ORAL_TABLET | Freq: Four times a day (QID) | ORAL | Status: DC | PRN

## 2012-10-13 MED ORDER — METOCLOPRAMIDE HCL 10 MG PO TABS: 10.0000 mg | ORAL_TABLET | Freq: Four times a day (QID) | ORAL | Status: DC

## 2012-10-13 MED ORDER — CEPHALEXIN 500 MG PO CAPS
500.0000 mg | ORAL_CAPSULE | Freq: Two times a day (BID) | ORAL | Status: DC
  Administered 2012-10-13 – 2012-10-16 (×7): 500 mg via ORAL
  Filled 2012-10-13 (×10): qty 1

## 2012-10-13 MED ORDER — FENTANYL CITRATE 0.05 MG/ML IJ SOLN
INTRAMUSCULAR | Status: AC | PRN
  Administered 2012-10-13: 50 ug via INTRAVENOUS
  Administered 2012-10-13: 25 ug via INTRAVENOUS

## 2012-10-13 NOTE — Progress Notes (Signed)
Saw pt earlier. C/o can't pull as much on IS as could yesterday. Sitting in chair. Looks well. No sob. No calf pain. Not tachy. O2 Sat 92 RA  Agree with stat pCXR.   Unfortunately cxr shows large right PTX.  Cancel d/c. Cont pulse ox Npo for now Cont O2 therapy Explained dx and treatment to pt and family. Showed cxr.  Stable now. No signs of resp distress Consult IR for chest tube.  If pt declines will place bedside Chest tube.  Pt agreeable with plan  Mary Sella. Andrey Campanile, MD, FACS General, Bariatric, & Minimally Invasive Surgery [facility], [place]

## 2012-10-13 NOTE — Progress Notes (Addendum)
Notified Dr. Janee Morn about Temp 100.4 Encouraged to do incentive spirometer.  [place], Charity fundraiser.

## 2012-10-13 NOTE — Progress Notes (Signed)
2 Days Post-Op  Subjective: Sats down to 85%  She normally does not have breathing issues.  Objective: Vital signs in last 24 hours: Temp:  [98.7 F (37.1 C)-100.4 F (38 C)] 99.7 F (37.6 C) (07/19 1054) Pulse Rate:  [87-96] 96 (07/19 1054) Resp:  [18-20] 18 (07/19 1054) BP: (109-115)/(59-69) 115/69 mmHg (07/19 1054) SpO2:  [86 %-92 %] 86 % (07/19 1054) Last BM Date: 10/11/12 (Prior to surgery) Tm 100.1 VSS No labs  Intake/Output from previous day: 07/18 0701 - 07/19 0700 In: 968 [P.O.:720] Out: 1305 [Urine:1150; Drains:155] Intake/Output this shift: Total I/O In: 240 [P.O.:240] Out: -   General appearance: alert, cooperative and no distress Resp: BS are down on right and sats are 85% on room air.  Cardio: regular rate and rhythm, S1, S2 normal, no murmur, click, rub or gallop  Lab Results:  No results found for this basename: WBC, HGB, HCT, PLT,  in the last 72 hours  BMET No results found for this basename: NA, K, CL, CO2, GLUCOSE, BUN, CREATININE, CALCIUM,  in the last 72 hours PT/INR No results found for this basename: LABPROT, INR,  in the last 72 hours  No results found for this basename: AST, ALT, ALKPHOS, BILITOT, PROT, ALBUMIN,  in the last 168 hours   Lipase  No results found for this basename: lipase     Studies/Results: No results found.  Medications: . amLODipine  5 mg Oral Q breakfast  . atorvastatin  10 mg Oral q1800  . buPROPion  150 mg Oral Q breakfast  . cephALEXin  500 mg Oral Q12H  . docusate sodium  100 mg Oral Daily  . enoxaparin (LOVENOX) injection  40 mg Subcutaneous Q24H  . enoxaparin   Does not apply Once  . pantoprazole  40 mg Oral Daily  . tamoxifen  20 mg Oral Q breakfast    Assessment/Plan Principal Problem:  Breast cancer, right breast-T1Nx s/p bilateral mastectomies and right axillary SLNBx with reconstruction 10/11/12   Plan:  We will get a PCXR now. CXR shows a large right pneumothorax, left lung was clear.     LOS: 2 days    Joe Tanney 10/13/2012

## 2012-10-13 NOTE — Discharge Summary (Signed)
Physician Discharge Summary  Patient ID: Sherry Stafford MRN: [id number] DOB/AGE: January 28, 1956 57 y.o.  Admit date: 10/11/2012 Discharge date: 10/13/2012  Admission Diagnoses:Breast cancer  Discharge Diagnoses:  Same Principal Problem:   Breast cancer, right breast-T1Nx s/p bilateral mastectomies and right axillary SLNBx with reconstruction 10/11/12   Discharged Condition: good  Hospital Course: On the day of admission the patient was taken to surgery and had bilateral mastectomy, right sentinel node, bilateral reconstruction with tissue expanders.. The patient tolerated the procedures well. Postoperatively, the mastectomy flaps maintained excellent color and capillary refill. The patient was ambulatory and tolerating diet on the first postoperative day. DVT prophylaxis was begun pre-op and resumed first post-op day.  Treatments: antibiotics: Ancef, anticoagulation: heparin and surgery: bilateral mastectomy, left sentinel node, bilateral reconstruction tissue expanders.  Discharge Exam: Blood pressure 113/61, pulse 91, temperature 100.1 F (37.8 C), temperature source Oral, resp. rate 20, SpO2 92.00%.  Operative sites: Mastectomy flaps viable. Tissue expanders appear to be in good position. Drains functioning. Drainage thin. No evidence of bleeding or infection either side..  Disposition: Final discharge disposition not confirmed   Future Appointments Provider Department Dept Phone   10/31/2012 12:00 PM Krista Blue [facility] MEDICAL ONCOLOGY [phone]   10/31/2012 12:30 PM Victorino December, MD [facility] MEDICAL ONCOLOGY [phone]       Medication List    STOP taking these medications       aspirin 81 MG tablet     co-enzyme Q-10 30 MG capsule     ibuprofen 200 MG tablet  Commonly known as:  ADVIL,MOTRIN     multivitamin with minerals Tabs     omega-3 acid ethyl esters 1 G capsule  Commonly known as:  LOVAZA      TAKE these medications       ACZONE 5 % topical gel  Generic drug:  Dapsone  Apply 1 application topically 2 (two) times daily as needed (for breakouts).     amLODipine 5 MG tablet  Commonly known as:  NORVASC  Take 5 mg by mouth daily with breakfast.     buPROPion 150 MG 24 hr tablet  Commonly known as:  WELLBUTRIN XL  Take 150 mg by mouth daily with breakfast.     CALCIUM PO  Take 0.5 tablets by mouth daily.     cephALEXin 500 MG capsule  Commonly known as:  KEFLEX  Take 1 capsule (500 mg total) by mouth every 12 (twelve) hours.     DEXILANT 60 MG capsule  Generic drug:  dexlansoprazole  Take 60 mg by mouth daily with breakfast.     DSS 100 MG Caps  Take 100 mg by mouth 2 (two) times daily.     enoxaparin 40 MG/0.4ML injection  Commonly known as:  LOVENOX  Inject 0.4 mLs (40 mg total) into the skin daily.     fluticasone 50 MCG/ACT nasal spray  Commonly known as:  FLONASE  Place 2 sprays into the nose daily as needed for allergies.     HYDROmorphone 2 MG tablet  Commonly known as:  DILAUDID  Take 1-2 tablets (2-4 mg total) by mouth every 4 (four) hours as needed.     methocarbamol 500 MG tablet  Commonly known as:  ROBAXIN  Take 1 tablet (500 mg total) by mouth every 6 (six) hours as needed.     metoCLOPramide 10 MG tablet  Commonly known as:  REGLAN  Take 1 tablet (10 mg total) by mouth  4 (four) times daily.     rosuvastatin 5 MG tablet  Commonly known as:  CRESTOR  Take 5 mg by mouth daily with breakfast.     tamoxifen 20 MG tablet  Commonly known as:  NOLVADEX  Take 20 mg by mouth daily with breakfast.         Signed: Zamoria Boss M 10/13/2012, 9:57 AM

## 2012-10-13 NOTE — Procedures (Signed)
Interventional Radiology Procedure Note  Procedure: Placement of a 57F pigtail chest tube into the right pleural space.  Tube connected to wall suction with complete re-expansion of teh lung.  Complications: None Recommendations: - Maintain to LWS overnight. - Consider trail of water seal tomorrow - CXR now to document new tube position  Signed,  Sterling Big, MD Vascular & Interventional Radiologist [facility] Radiology

## 2012-10-14 ENCOUNTER — Inpatient Hospital Stay (HOSPITAL_COMMUNITY): Payer: BC Managed Care – PPO

## 2012-10-14 ENCOUNTER — Encounter (HOSPITAL_COMMUNITY): Payer: Self-pay | Admitting: Surgery

## 2012-10-14 MED ORDER — ONDANSETRON HCL 4 MG/2ML IJ SOLN: 4.0000 mg | Freq: Four times a day (QID) | INTRAMUSCULAR | Status: DC | PRN

## 2012-10-14 MED ORDER — METHOCARBAMOL 500 MG PO TABS
500.0000 mg | ORAL_TABLET | Freq: Four times a day (QID) | ORAL | Status: DC
  Administered 2012-10-14 – 2012-10-16 (×10): 500 mg via ORAL
  Filled 2012-10-14 (×18): qty 1

## 2012-10-14 NOTE — Progress Notes (Signed)
3 Days Post-Op  Subjective: Rt chest tube placed secondary PTX after mastectomy Dr Archer Asa- IR No complaints Breathing better  Objective: Vital signs in last 24 hours: Temp:  [97.7 F (36.5 C)-100.2 F (37.9 C)] 98 F (36.7 C) (07/20 0534) Pulse Rate:  [74-98] 74 (07/20 0534) Resp:  [14-20] 18 (07/20 0534) BP: (100-136)/(58-75) 113/72 mmHg (07/20 0534) SpO2:  [86 %-96 %] 95 % (07/20 0534) Weight:  [182 lb (82.555 kg)] 182 lb (82.555 kg) (07/20 0700) Last BM Date: 10/11/12  Intake/Output from previous day: 07/19 0701 - 07/20 0700 In: 360 [P.O.:360] Out: 255 [Drains:255] Intake/Output this shift:    PE:  Afeb; vss Chest tube intact No air leak 140 cc in chamber of pleur vac- serous fluid Site clean and dry  Lab Results:  No results found for this basename: WBC, HGB, HCT, PLT,  in the last 72 hours BMET No results found for this basename: NA, K, CL, CO2, GLUCOSE, BUN, CREATININE, CALCIUM,  in the last 72 hours PT/INR No results found for this basename: LABPROT, INR,  in the last 72 hours ABG No results found for this basename: PHART, PCO2, PO2, HCO3,  in the last 72 hours  Studies/Results: Dg Chest 1 View  10/13/2012   *RADIOLOGY REPORT*  Clinical Data: Repositioning of chest tube.  CHEST - 1 VIEW  Comparison: CT images of the chest this same point that seen into the chest this same date.  Findings: A new pigtail catheter is in place in the right hemithorax.  Small bore bilateral chest tubes remain in place. Right pneumothorax seen on the comparison examinations is no longer identified.  Right basilar atelectasis and a small effusion are noted.  Mild left basilar atelectasis is also noted.  Heart size upper normal.  IMPRESSION:  1.  Resolved right pneumothorax with a new right chest tube in place. 2.  Negative for left pneumothorax with a chest tube in place. 3.  Bibasilar atelectasis and small right effusion.   Original Report Authenticated By: Holley Dexter, M.D.    Dg Chest Port 1 View  10/14/2012   *RADIOLOGY REPORT*  Clinical Data: Pneumothorax  PORTABLE CHEST - 1 VIEW  Comparison: Prior radiograph from 10/13/2012  Findings: Right-sided pigtail catheter is in stable position with tip overlying the mid right lung base.  Bilateral chest tubes are in stable position.  The lungs remain hypoinflated.  No definite pneumothorax identified on this semi upright projection.  Right basilar atelectasis is improved.  Patchy left basilar opacity persists, and likely represents atelectasis.  The heart again appears enlarged, which may be in part reflective of hypoinflation and AP projection.  IMPRESSION: 1.  No pneumothorax identified on this semi upright AP radiograph. Bilateral chest tubes and right pigtail chest catheter in place as above. 2.  Interval improvement in right basilar atelectasis with persistent left basilar atelectasis.  cheek   Original Report Authenticated By: Rise Mu, M.D.   Dg Chest Port 1 View  10/13/2012   *RADIOLOGY REPORT*  Clinical Data: Shortness of breath, status post bilateral mastectomy 2 days ago, hypoxia  PORTABLE CHEST - 1 VIEW  Comparison: 10/11/2012  Findings: Large right pneumothorax, new.  Left lung is clear.  Heart is normal in size.  Status post bilateral mastectomy with tissue expanders and bilateral chest wall drains.  IMPRESSION: Large right pneumothorax, new.  Critical Value/emergent results were called by telephone at the time of interpretation on 10/13/2012 at 1205 hours to Thomasene Mohair, the nurse caring for the patient, who  verbally acknowledged these results.   Original Report Authenticated By: Charline Bills, M.D.   Ct Perc Pleural Drain W/indwell Cath W/img Guide  10/13/2012   *RADIOLOGY REPORT*  CT GUIDED PLACEMENT OF A PERCUTANEOUS THORACOSTOMY TUBE  Date: 10/13/2012  Clinical History: 57 year old female status post bilateral mastectomy with placement of tissue expanders.  She developed a large spontaneous  pneumothorax today and requires urgent thoracostomy tube placement.  Due to her recent surgical incisions, existing soft tissue drains and tissue expanders, interventional radiology was consulted for image-guided thoracostomy tube placement.  Procedures Performed: 1. CT guided placement of a right thoracostomy tube  Interventional Radiologist:  Sterling Big, MD  Sedation: Moderate (conscious) sedation was used.  Two mg Versed, 75 mcg Fentanyl were administered intravenously.  The patient's vital signs were monitored continuously by radiology nursing throughout the procedure.  Sedation Time: 30 minutes  PROCEDURE/FINDINGS:   Informed consent was obtained from the patient following explanation of the procedure, risks, benefits and alternatives. The patient understands, agrees and consents for the procedure. All questions were addressed. A time out was performed.  Maximal barrier sterile technique utilized including caps, mask, sterile gowns, sterile gloves, large sterile drape, hand hygiene, and betadine skin prep.  A planning axial CT scan was performed.  There is a very large right-sided pneumothorax.  An appropriate skin entry site was selected and marked.  Local anesthesia was attained by infiltration with 1% lidocaine.  Under snap shot CT imaging guidance, and 18 gauge trocar needle was carefully advanced through the right lateral chest wall just anterior to the mid axillary line and into the pleural space.  A 0.035-inch wire was then advanced into the pleural space.  The skin tract was dilated to 10-French and a Cook 10-French all-purpose pigtail drainage catheter was advanced to the pleural space.  The catheter was connected to wall suction.  Post tube placement axial CT imaging demonstrates near total interval re- expansion of the lung with evacuation of pneumothorax.  There is trace residual anterobasal pneumothorax.  The thoracostomy tube is positioned anteriorly in the right base.  There is residual  atelectasis in the right lower lobe.  IMPRESSION:  Technically successful CT guided placement of a 10-French percutaneous right thoracostomy tube.  The tube is connected to low wall suction.  There was near total evacuation of pneumothorax and re-expansion of the lung.  Trace residual anterobasal pneumothorax remains.  Signed,  Sterling Big, MD Vascular & Interventional Radiologist [facility]   Original Report Authenticated By: Malachy Moan, M.D.    Anti-infectives: Anti-infectives   Start     Dose/Rate Route Frequency Ordered Stop   10/13/12 1000  cephALEXin (KEFLEX) capsule 500 mg     500 mg Oral Every 12 hours 10/13/12 0951     10/13/12 0000  cephALEXin (KEFLEX) 500 MG capsule     500 mg Oral Every 12 hours 10/13/12 0956     10/11/12 2200  ceFAZolin (ANCEF) IVPB 1 g/50 mL premix  Status:  Discontinued     1 g 100 mL/hr over 30 Minutes Intravenous 3 times per day 10/11/12 2014 10/13/12 0951   10/11/12 0600  ceFAZolin (ANCEF) IVPB 2 g/50 mL premix     2 g 100 mL/hr over 30 Minutes Intravenous On call to O.R. 10/10/12 1420 10/11/12 1628      Assessment/Plan: s/p Procedure(s) with comments:  bilateral MASTECTOMY WITH right  SENTINEL LYMPH NODE BIOPSY (Right) - right nuclear medicine injection 10:45 dr Odis Luster to follow with reconstruction time  alloted  TOTAL MASTECTOMY (Left) BREAST RECONSTRUCTION WITH PLACEMENT OF TISSUE EXPANDER AND FLEX HD (ACELLULAR HYDRATED DERMIS) (Bilateral)   B mastectomy PTX Rt Chest tube placed in IR  140 cc in pleurvac Doing well Plan per [facility]  LOS: 3 days    Lynett Brasil A 10/14/2012

## 2012-10-14 NOTE — Progress Notes (Signed)
Subjective: Able to take deeper breath on spirometer today. Pain is unchanged from previous and appears to be surgical.  Objective: Vital signs in last 24 hours: Temp:  [97.7 F (36.5 C)-100.2 F (37.9 C)] 98 F (36.7 C) (07/20 0534) Pulse Rate:  [74-98] 74 (07/20 0534) Resp:  [14-20] 18 (07/20 0534) BP: (100-136)/(58-75) 113/72 mmHg (07/20 0534) SpO2:  [86 %-96 %] 95 % (07/20 0534) Weight:  [182 lb (82.555 kg)] 182 lb (82.555 kg) (07/20 0700)  Intake/Output from previous day: 07/19 0701 - 07/20 0700 In: 360 [P.O.:360] Out: 255 [Drains:255] Intake/Output this shift:    Operative sites: Stable. Drains functioning. Drainage thin.  No results found for this basename: WBC, HGB, HCT, PLATELETS, NA, K, CL, CO2, BUN, CREATININE, GLU,  in the last 72 hours  Studies/Results: Dg Chest 1 View  10/13/2012   *RADIOLOGY REPORT*  Clinical Data: Repositioning of chest tube.  CHEST - 1 VIEW  Comparison: CT images of the chest this same point that seen into the chest this same date.  Findings: A new pigtail catheter is in place in the right hemithorax.  Small bore bilateral chest tubes remain in place. Right pneumothorax seen on the comparison examinations is no longer identified.  Right basilar atelectasis and a small effusion are noted.  Mild left basilar atelectasis is also noted.  Heart size upper normal.  IMPRESSION:  1.  Resolved right pneumothorax with a new right chest tube in place. 2.  Negative for left pneumothorax with a chest tube in place. 3.  Bibasilar atelectasis and small right effusion.   Original Report Authenticated By: Holley Dexter, M.D.   Dg Chest Port 1 View  10/14/2012   *RADIOLOGY REPORT*  Clinical Data: Pneumothorax  PORTABLE CHEST - 1 VIEW  Comparison: Prior radiograph from 10/13/2012  Findings: Right-sided pigtail catheter is in stable position with tip overlying the mid right lung base.  Bilateral chest tubes are in stable position.  The lungs remain hypoinflated.  No  definite pneumothorax identified on this semi upright projection.  Right basilar atelectasis is improved.  Patchy left basilar opacity persists, and likely represents atelectasis.  The heart again appears enlarged, which may be in part reflective of hypoinflation and AP projection.  IMPRESSION: 1.  No pneumothorax identified on this semi upright AP radiograph. Bilateral chest tubes and right pigtail chest catheter in place as above. 2.  Interval improvement in right basilar atelectasis with persistent left basilar atelectasis.  cheek   Original Report Authenticated By: Rise Mu, M.D.   Dg Chest Port 1 View  10/13/2012   *RADIOLOGY REPORT*  Clinical Data: Shortness of breath, status post bilateral mastectomy 2 days ago, hypoxia  PORTABLE CHEST - 1 VIEW  Comparison: 10/11/2012  Findings: Large right pneumothorax, new.  Left lung is clear.  Heart is normal in size.  Status post bilateral mastectomy with tissue expanders and bilateral chest wall drains.  IMPRESSION: Large right pneumothorax, new.  Critical Value/emergent results were called by telephone at the time of interpretation on 10/13/2012 at 1205 hours to Thomasene Mohair, the nurse caring for the patient, who verbally acknowledged these results.   Original Report Authenticated By: Charline Bills, M.D.   Ct Perc Pleural Drain W/indwell Cath W/img Guide  10/13/2012   *RADIOLOGY REPORT*  CT GUIDED PLACEMENT OF A PERCUTANEOUS THORACOSTOMY TUBE  Date: 10/13/2012  Clinical History: 57 year old female status post bilateral mastectomy with placement of tissue expanders.  She developed a large spontaneous pneumothorax today and requires urgent thoracostomy tube placement.  Due to her recent surgical incisions, existing soft tissue drains and tissue expanders, interventional radiology was consulted for image-guided thoracostomy tube placement.  Procedures Performed: 1. CT guided placement of a right thoracostomy tube  Interventional Radiologist:   Sterling Big, MD  Sedation: Moderate (conscious) sedation was used.  Two mg Versed, 75 mcg Fentanyl were administered intravenously.  The patient's vital signs were monitored continuously by radiology nursing throughout the procedure.  Sedation Time: 30 minutes  PROCEDURE/FINDINGS:   Informed consent was obtained from the patient following explanation of the procedure, risks, benefits and alternatives. The patient understands, agrees and consents for the procedure. All questions were addressed. A time out was performed.  Maximal barrier sterile technique utilized including caps, mask, sterile gowns, sterile gloves, large sterile drape, hand hygiene, and betadine skin prep.  A planning axial CT scan was performed.  There is a very large right-sided pneumothorax.  An appropriate skin entry site was selected and marked.  Local anesthesia was attained by infiltration with 1% lidocaine.  Under snap shot CT imaging guidance, and 18 gauge trocar needle was carefully advanced through the right lateral chest wall just anterior to the mid axillary line and into the pleural space.  A 0.035-inch wire was then advanced into the pleural space.  The skin tract was dilated to 10-French and a Cook 10-French all-purpose pigtail drainage catheter was advanced to the pleural space.  The catheter was connected to wall suction.  Post tube placement axial CT imaging demonstrates near total interval re- expansion of the lung with evacuation of pneumothorax.  There is trace residual anterobasal pneumothorax.  The thoracostomy tube is positioned anteriorly in the right base.  There is residual atelectasis in the right lower lobe.  IMPRESSION:  Technically successful CT guided placement of a 10-French percutaneous right thoracostomy tube.  The tube is connected to low wall suction.  There was near total evacuation of pneumothorax and re-expansion of the lung.  Trace residual anterobasal pneumothorax remains.  Signed,  Sterling Big, MD Vascular & Interventional Radiologist [facility]   Original Report Authenticated By: Malachy Moan, M.D.    Assessment/Plan: Continue with right chest cathter.    LOS: 3 days    Taina Landry M 10/14/2012 8:22 AM

## 2012-10-14 NOTE — Progress Notes (Signed)
3 Days Post-Op  Subjective: Had pigtail catheter placed yesterday; easier to breathe. Feels N. Just got out of bed. O2 sat 91 on RA. 140cc oout of pleura vac  Objective: Vital signs in last 24 hours: Temp:  [97.7 F (36.5 C)-100.2 F (37.9 C)] 98 F (36.7 C) (07/20 0534) Pulse Rate:  [74-98] 74 (07/20 0534) Resp:  [14-20] 18 (07/20 0534) BP: (100-136)/(58-75) 113/72 mmHg (07/20 0534) SpO2:  [86 %-96 %] 95 % (07/20 0534) Weight:  [182 lb (82.555 kg)] 182 lb (82.555 kg) (07/20 0700) Last BM Date: 10/11/12  Intake/Output from previous day: 07/19 0701 - 07/20 0700 In: 360 [P.O.:360] Out: 255 [Drains:255] Intake/Output this shift:    Alert, sitting in chair cta with dec movement on rt but bs throughout No air leak, clear serous fluid in pleuravac Other drains serosang.  reg  Lab Results:  No results found for this basename: WBC, HGB, HCT, PLT,  in the last 72 hours BMET No results found for this basename: NA, K, CL, CO2, GLUCOSE, BUN, CREATININE, CALCIUM,  in the last 72 hours PT/INR No results found for this basename: LABPROT, INR,  in the last 72 hours ABG No results found for this basename: PHART, PCO2, PO2, HCO3,  in the last 72 hours  Studies/Results: Dg Chest 1 View  10/13/2012   *RADIOLOGY REPORT*  Clinical Data: Repositioning of chest tube.  CHEST - 1 VIEW  Comparison: CT images of the chest this same point that seen into the chest this same date.  Findings: A new pigtail catheter is in place in the right hemithorax.  Small bore bilateral chest tubes remain in place. Right pneumothorax seen on the comparison examinations is no longer identified.  Right basilar atelectasis and a small effusion are noted.  Mild left basilar atelectasis is also noted.  Heart size upper normal.  IMPRESSION:  1.  Resolved right pneumothorax with a new right chest tube in place. 2.  Negative for left pneumothorax with a chest tube in place. 3.  Bibasilar atelectasis and small right effusion.    Original Report Authenticated By: Holley Dexter, M.D.   Dg Chest Port 1 View  10/14/2012   *RADIOLOGY REPORT*  Clinical Data: Pneumothorax  PORTABLE CHEST - 1 VIEW  Comparison: Prior radiograph from 10/13/2012  Findings: Right-sided pigtail catheter is in stable position with tip overlying the mid right lung base.  Bilateral chest tubes are in stable position.  The lungs remain hypoinflated.  No definite pneumothorax identified on this semi upright projection.  Right basilar atelectasis is improved.  Patchy left basilar opacity persists, and likely represents atelectasis.  The heart again appears enlarged, which may be in part reflective of hypoinflation and AP projection.  IMPRESSION: 1.  No pneumothorax identified on this semi upright AP radiograph. Bilateral chest tubes and right pigtail chest catheter in place as above. 2.  Interval improvement in right basilar atelectasis with persistent left basilar atelectasis.  cheek   Original Report Authenticated By: Rise Mu, M.D.   Dg Chest Port 1 View  10/13/2012   *RADIOLOGY REPORT*  Clinical Data: Shortness of breath, status post bilateral mastectomy 2 days ago, hypoxia  PORTABLE CHEST - 1 VIEW  Comparison: 10/11/2012  Findings: Large right pneumothorax, new.  Left lung is clear.  Heart is normal in size.  Status post bilateral mastectomy with tissue expanders and bilateral chest wall drains.  IMPRESSION: Large right pneumothorax, new.  Critical Value/emergent results were called by telephone at the time of interpretation on 10/13/2012  at 1205 hours to Thomasene Mohair, the nurse caring for the patient, who verbally acknowledged these results.   Original Report Authenticated By: Charline Bills, M.D.   Ct Perc Pleural Drain W/indwell Cath W/img Guide  10/13/2012   *RADIOLOGY REPORT*  CT GUIDED PLACEMENT OF A PERCUTANEOUS THORACOSTOMY TUBE  Date: 10/13/2012  Clinical History: 57 year old female status post bilateral mastectomy with placement of  tissue expanders.  She developed a large spontaneous pneumothorax today and requires urgent thoracostomy tube placement.  Due to her recent surgical incisions, existing soft tissue drains and tissue expanders, interventional radiology was consulted for image-guided thoracostomy tube placement.  Procedures Performed: 1. CT guided placement of a right thoracostomy tube  Interventional Radiologist:  Sterling Big, MD  Sedation: Moderate (conscious) sedation was used.  Two mg Versed, 75 mcg Fentanyl were administered intravenously.  The patient's vital signs were monitored continuously by radiology nursing throughout the procedure.  Sedation Time: 30 minutes  PROCEDURE/FINDINGS:   Informed consent was obtained from the patient following explanation of the procedure, risks, benefits and alternatives. The patient understands, agrees and consents for the procedure. All questions were addressed. A time out was performed.  Maximal barrier sterile technique utilized including caps, mask, sterile gowns, sterile gloves, large sterile drape, hand hygiene, and betadine skin prep.  A planning axial CT scan was performed.  There is a very large right-sided pneumothorax.  An appropriate skin entry site was selected and marked.  Local anesthesia was attained by infiltration with 1% lidocaine.  Under snap shot CT imaging guidance, and 18 gauge trocar needle was carefully advanced through the right lateral chest wall just anterior to the mid axillary line and into the pleural space.  A 0.035-inch wire was then advanced into the pleural space.  The skin tract was dilated to 10-French and a Cook 10-French all-purpose pigtail drainage catheter was advanced to the pleural space.  The catheter was connected to wall suction.  Post tube placement axial CT imaging demonstrates near total interval re- expansion of the lung with evacuation of pneumothorax.  There is trace residual anterobasal pneumothorax.  The thoracostomy tube is  positioned anteriorly in the right base.  There is residual atelectasis in the right lower lobe.  IMPRESSION:  Technically successful CT guided placement of a 10-French percutaneous right thoracostomy tube.  The tube is connected to low wall suction.  There was near total evacuation of pneumothorax and re-expansion of the lung.  Trace residual anterobasal pneumothorax remains.  Signed,  Sterling Big, MD Vascular & Interventional Radiologist [facility]   Original Report Authenticated By: Malachy Moan, M.D.    Anti-infectives: Anti-infectives   Start     Dose/Rate Route Frequency Ordered Stop   10/13/12 1000  cephALEXin (KEFLEX) capsule 500 mg     500 mg Oral Every 12 hours 10/13/12 0951     10/13/12 0000  cephALEXin (KEFLEX) 500 MG capsule     500 mg Oral Every 12 hours 10/13/12 0956     10/11/12 2200  ceFAZolin (ANCEF) IVPB 1 g/50 mL premix  Status:  Discontinued     1 g 100 mL/hr over 30 Minutes Intravenous 3 times per day 10/11/12 2014 10/13/12 0951   10/11/12 0600  ceFAZolin (ANCEF) IVPB 2 g/50 mL premix     2 g 100 mL/hr over 30 Minutes Intravenous On call to O.R. 10/10/12 1420 10/11/12 1628      Assessment/Plan: s/p Procedure(s) with comments:  bilateral MASTECTOMY WITH right  SENTINEL LYMPH NODE BIOPSY (Right) -  right nuclear medicine injection 10:45 dr Odis Luster to follow with reconstruction time alloted  TOTAL MASTECTOMY (Left) BREAST RECONSTRUCTION WITH PLACEMENT OF TISSUE EXPANDER AND FLEX HD (ACELLULAR HYDRATED DERMIS) (Bilateral)  R PTX s/p IR placement of pigtail catheter - cont today; hopefully waterseal in am. Cont supplemental O2. pulm toilet. Ok for pt to ambulate in halls on waterseal, but reconnect to suction while in room. Discussed with nurse  Dec mIVF Cont proph lovenox and abx per plastics  Mary Sella. Andrey Campanile, MD, FACS General, Bariatric, & Minimally Invasive Surgery [facility], [place]   LOS: 3 days    Atilano Ina 10/14/2012

## 2012-10-15 ENCOUNTER — Inpatient Hospital Stay (HOSPITAL_COMMUNITY): Payer: BC Managed Care – PPO

## 2012-10-15 DIAGNOSIS — J9383 Other pneumothorax: Secondary | ICD-10-CM

## 2012-10-15 NOTE — Progress Notes (Signed)
Patient ID: Sherry Stafford, female   DOB: 1955/05/28, 57 y.o.   MRN: [id number] F/u CXR this afternoon revealed no ptx. Case d/w Dr. Abbey Chatters and decision made to remove rt chest tube. Rt chest tube removed in its entirety without immediate complications. Vaseline/gauze dressing applied to site. F/u CXR pending.

## 2012-10-15 NOTE — Progress Notes (Signed)
4 Days Post-Op  Subjective: Pt doing well; no sig dyspnea, coughing or CP; awaiting f/u CXR  Objective: Vital signs in last 24 hours: Temp:  [98.3 F (36.8 C)-98.8 F (37.1 C)] 98.5 F (36.9 C) (07/21 0527) Pulse Rate:  [78-90] 78 (07/21 0527) Resp:  [18-20] 18 (07/21 0527) BP: (96-114)/(56-80) 96/80 mmHg (07/21 0527) SpO2:  [93 %-99 %] 96 % (07/21 0527) Last BM Date: 10/11/12  Intake/Output from previous day: 07/20 0701 - 07/21 0700 In: 6112.5 [P.O.:480; I.V.:5632.5] Out: 185 [Drains:185] Intake/Output this shift: Total I/O In: 100 [I.V.:100] Out: -   Rt chest tube intact, no air leak, output 300 cc's yellow fluid; CXR this am with no sig ptx  Lab Results:  No results found for this basename: WBC, HGB, HCT, PLT,  in the last 72 hours BMET No results found for this basename: NA, K, CL, CO2, GLUCOSE, BUN, CREATININE, CALCIUM,  in the last 72 hours PT/INR No results found for this basename: LABPROT, INR,  in the last 72 hours ABG No results found for this basename: PHART, PCO2, PO2, HCO3,  in the last 72 hours  Studies/Results: Dg Chest 1 View  10/13/2012   *RADIOLOGY REPORT*  Clinical Data: Repositioning of chest tube.  CHEST - 1 VIEW  Comparison: CT images of the chest this same point that seen into the chest this same date.  Findings: A new pigtail catheter is in place in the right hemithorax.  Small bore bilateral chest tubes remain in place. Right pneumothorax seen on the comparison examinations is no longer identified.  Right basilar atelectasis and a small effusion are noted.  Mild left basilar atelectasis is also noted.  Heart size upper normal.  IMPRESSION:  1.  Resolved right pneumothorax with a new right chest tube in place. 2.  Negative for left pneumothorax with a chest tube in place. 3.  Bibasilar atelectasis and small right effusion.   Original Report Authenticated By: Holley Dexter, M.D.   Dg Chest Port 1 View  10/15/2012   *RADIOLOGY REPORT*  Clinical Data:  Short of breath  PORTABLE CHEST - 1 VIEW  Comparison: 10/14/2012  Findings: Low lung volumes with bibasilar atelectasis.  Right chest tube is stable.  No pneumothorax.  Normal heart size.   Surgical drains project over the right and left hemithoraces.  IMPRESSION: Stable right chest tube.  Stable bilateral surgical drains.  No pneumothorax.   Original Report Authenticated By: Jolaine Click, M.D.   Dg Chest Port 1 View  10/14/2012   *RADIOLOGY REPORT*  Clinical Data: Pneumothorax  PORTABLE CHEST - 1 VIEW  Comparison: Prior radiograph from 10/13/2012  Findings: Right-sided pigtail catheter is in stable position with tip overlying the mid right lung base.  Bilateral chest tubes are in stable position.  The lungs remain hypoinflated.  No definite pneumothorax identified on this semi upright projection.  Right basilar atelectasis is improved.  Patchy left basilar opacity persists, and likely represents atelectasis.  The heart again appears enlarged, which may be in part reflective of hypoinflation and AP projection.  IMPRESSION: 1.  No pneumothorax identified on this semi upright AP radiograph. Bilateral chest tubes and right pigtail chest catheter in place as above. 2.  Interval improvement in right basilar atelectasis with persistent left basilar atelectasis.  cheek   Original Report Authenticated By: Rise Mu, M.D.   Ct Perc Pleural Drain W/indwell Cath W/img Guide  10/13/2012   *RADIOLOGY REPORT*  CT GUIDED PLACEMENT OF A PERCUTANEOUS THORACOSTOMY TUBE  Date: 10/13/2012  Clinical History: 57 year old female status post bilateral mastectomy with placement of tissue expanders.  She developed a large spontaneous pneumothorax today and requires urgent thoracostomy tube placement.  Due to her recent surgical incisions, existing soft tissue drains and tissue expanders, interventional radiology was consulted for image-guided thoracostomy tube placement.  Procedures Performed: 1. CT guided placement of a right  thoracostomy tube  Interventional Radiologist:  Sterling Big, MD  Sedation: Moderate (conscious) sedation was used.  Two mg Versed, 75 mcg Fentanyl were administered intravenously.  The patient's vital signs were monitored continuously by radiology nursing throughout the procedure.  Sedation Time: 30 minutes  PROCEDURE/FINDINGS:   Informed consent was obtained from the patient following explanation of the procedure, risks, benefits and alternatives. The patient understands, agrees and consents for the procedure. All questions were addressed. A time out was performed.  Maximal barrier sterile technique utilized including caps, mask, sterile gowns, sterile gloves, large sterile drape, hand hygiene, and betadine skin prep.  A planning axial CT scan was performed.  There is a very large right-sided pneumothorax.  An appropriate skin entry site was selected and marked.  Local anesthesia was attained by infiltration with 1% lidocaine.  Under snap shot CT imaging guidance, and 18 gauge trocar needle was carefully advanced through the right lateral chest wall just anterior to the mid axillary line and into the pleural space.  A 0.035-inch wire was then advanced into the pleural space.  The skin tract was dilated to 10-French and a Cook 10-French all-purpose pigtail drainage catheter was advanced to the pleural space.  The catheter was connected to wall suction.  Post tube placement axial CT imaging demonstrates near total interval re- expansion of the lung with evacuation of pneumothorax.  There is trace residual anterobasal pneumothorax.  The thoracostomy tube is positioned anteriorly in the right base.  There is residual atelectasis in the right lower lobe.  IMPRESSION:  Technically successful CT guided placement of a 10-French percutaneous right thoracostomy tube.  The tube is connected to low wall suction.  There was near total evacuation of pneumothorax and re-expansion of the lung.  Trace residual anterobasal  pneumothorax remains.  Signed,  Sterling Big, MD Vascular & Interventional Radiologist [facility]   Original Report Authenticated By: Malachy Moan, M.D.    Anti-infectives: Anti-infectives   Start     Dose/Rate Route Frequency Ordered Stop   10/13/12 1000  cephALEXin (KEFLEX) capsule 500 mg     500 mg Oral Every 12 hours 10/13/12 0951     10/13/12 0000  cephALEXin (KEFLEX) 500 MG capsule     500 mg Oral Every 12 hours 10/13/12 0956     10/11/12 2200  ceFAZolin (ANCEF) IVPB 1 g/50 mL premix  Status:  Discontinued     1 g 100 mL/hr over 30 Minutes Intravenous 3 times per day 10/11/12 2014 10/13/12 0951   10/11/12 0600  ceFAZolin (ANCEF) IVPB 2 g/50 mL premix     2 g 100 mL/hr over 30 Minutes Intravenous On call to O.R. 10/10/12 1420 10/11/12 1628      Assessment/Plan: s/p rt chest tube 7/19 for ptx; CXR earlier today with no ptx, pt now on water seal and awaiting f/u CXR; if film stable- possible tube removal - will await decision from [facility].   LOS: 4 days    ALLRED,D [facility] 10/15/2012

## 2012-10-15 NOTE — Progress Notes (Signed)
4 Days Post-Op  Subjective: Events of weekend noted.  No dyspnea.  Objective: Vital signs in last 24 hours: Temp:  [98.3 F (36.8 C)-98.8 F (37.1 C)] 98.5 F (36.9 C) (07/21 0527) Pulse Rate:  [78-90] 78 (07/21 0527) Resp:  [18-20] 18 (07/21 0527) BP: (96-114)/(56-80) 96/80 mmHg (07/21 0527) SpO2:  [93 %-99 %] 96 % (07/21 0527) Last BM Date: 10/11/12  Intake/Output from previous day: 07/20 0701 - 07/21 0700 In: 6112.5 [P.O.:480; I.V.:5632.5] Out: 185 [Drains:185] Intake/Output this shift:    PE: General- In NAD Chest-slight coarse breath sounds on right; no airleak from chest tube   Lab Results:  No results found for this basename: WBC, HGB, HCT, PLT,  in the last 72 hours BMET No results found for this basename: NA, K, CL, CO2, GLUCOSE, BUN, CREATININE, CALCIUM,  in the last 72 hours PT/INR No results found for this basename: LABPROT, INR,  in the last 72 hours Comprehensive Metabolic Panel:    Component Value Date/Time   NA 142 10/04/2012 1034   K 3.9 10/04/2012 1034   CL 107 10/04/2012 1034   CO2 26 10/04/2012 1034   BUN 13 10/04/2012 1034   CREATININE 0.94 10/04/2012 1034   GLUCOSE 87 10/04/2012 1034   CALCIUM 10.1 10/04/2012 1034   AST 15 10/04/2012 1034   ALT 16 10/04/2012 1034   ALKPHOS 55 10/04/2012 1034   BILITOT 0.2* 10/04/2012 1034   PROT 7.2 10/04/2012 1034   ALBUMIN 3.8 10/04/2012 1034     Studies/Results: Dg Chest 1 View  10/13/2012   *RADIOLOGY REPORT*  Clinical Data: Repositioning of chest tube.  CHEST - 1 VIEW  Comparison: CT images of the chest this same point that seen into the chest this same date.  Findings: A new pigtail catheter is in place in the right hemithorax.  Small bore bilateral chest tubes remain in place. Right pneumothorax seen on the comparison examinations is no longer identified.  Right basilar atelectasis and a small effusion are noted.  Mild left basilar atelectasis is also noted.  Heart size upper normal.  IMPRESSION:  1.  Resolved  right pneumothorax with a new right chest tube in place. 2.  Negative for left pneumothorax with a chest tube in place. 3.  Bibasilar atelectasis and small right effusion.   Original Report Authenticated By: Holley Dexter, M.D.   Dg Chest Port 1 View  10/15/2012   *RADIOLOGY REPORT*  Clinical Data: Short of breath  PORTABLE CHEST - 1 VIEW  Comparison: 10/14/2012  Findings: Low lung volumes with bibasilar atelectasis.  Right chest tube is stable.  No pneumothorax.  Normal heart size.   Surgical drains project over the right and left hemithoraces.  IMPRESSION: Stable right chest tube.  Stable bilateral surgical drains.  No pneumothorax.   Original Report Authenticated By: Jolaine Click, M.D.   Dg Chest Port 1 View  10/14/2012   *RADIOLOGY REPORT*  Clinical Data: Pneumothorax  PORTABLE CHEST - 1 VIEW  Comparison: Prior radiograph from 10/13/2012  Findings: Right-sided pigtail catheter is in stable position with tip overlying the mid right lung base.  Bilateral chest tubes are in stable position.  The lungs remain hypoinflated.  No definite pneumothorax identified on this semi upright projection.  Right basilar atelectasis is improved.  Patchy left basilar opacity persists, and likely represents atelectasis.  The heart again appears enlarged, which may be in part reflective of hypoinflation and AP projection.  IMPRESSION: 1.  No pneumothorax identified on this semi upright AP radiograph.  Bilateral chest tubes and right pigtail chest catheter in place as above. 2.  Interval improvement in right basilar atelectasis with persistent left basilar atelectasis.  cheek   Original Report Authenticated By: Rise Mu, M.D.   Dg Chest Port 1 View  10/13/2012   *RADIOLOGY REPORT*  Clinical Data: Shortness of breath, status post bilateral mastectomy 2 days ago, hypoxia  PORTABLE CHEST - 1 VIEW  Comparison: 10/11/2012  Findings: Large right pneumothorax, new.  Left lung is clear.  Heart is normal in size.  Status  post bilateral mastectomy with tissue expanders and bilateral chest wall drains.  IMPRESSION: Large right pneumothorax, new.  Critical Value/emergent results were called by telephone at the time of interpretation on 10/13/2012 at 1205 hours to Thomasene Mohair, the nurse caring for the patient, who verbally acknowledged these results.   Original Report Authenticated By: Charline Bills, M.D.   Ct Perc Pleural Drain W/indwell Cath W/img Guide  10/13/2012   *RADIOLOGY REPORT*  CT GUIDED PLACEMENT OF A PERCUTANEOUS THORACOSTOMY TUBE  Date: 10/13/2012  Clinical History: 57 year old female status post bilateral mastectomy with placement of tissue expanders.  She developed a large spontaneous pneumothorax today and requires urgent thoracostomy tube placement.  Due to her recent surgical incisions, existing soft tissue drains and tissue expanders, interventional radiology was consulted for image-guided thoracostomy tube placement.  Procedures Performed: 1. CT guided placement of a right thoracostomy tube  Interventional Radiologist:  Sterling Big, MD  Sedation: Moderate (conscious) sedation was used.  Two mg Versed, 75 mcg Fentanyl were administered intravenously.  The patient's vital signs were monitored continuously by radiology nursing throughout the procedure.  Sedation Time: 30 minutes  PROCEDURE/FINDINGS:   Informed consent was obtained from the patient following explanation of the procedure, risks, benefits and alternatives. The patient understands, agrees and consents for the procedure. All questions were addressed. A time out was performed.  Maximal barrier sterile technique utilized including caps, mask, sterile gowns, sterile gloves, large sterile drape, hand hygiene, and betadine skin prep.  A planning axial CT scan was performed.  There is a very large right-sided pneumothorax.  An appropriate skin entry site was selected and marked.  Local anesthesia was attained by infiltration with 1% lidocaine.   Under snap shot CT imaging guidance, and 18 gauge trocar needle was carefully advanced through the right lateral chest wall just anterior to the mid axillary line and into the pleural space.  A 0.035-inch wire was then advanced into the pleural space.  The skin tract was dilated to 10-French and a Cook 10-French all-purpose pigtail drainage catheter was advanced to the pleural space.  The catheter was connected to wall suction.  Post tube placement axial CT imaging demonstrates near total interval re- expansion of the lung with evacuation of pneumothorax.  There is trace residual anterobasal pneumothorax.  The thoracostomy tube is positioned anteriorly in the right base.  There is residual atelectasis in the right lower lobe.  IMPRESSION:  Technically successful CT guided placement of a 10-French percutaneous right thoracostomy tube.  The tube is connected to low wall suction.  There was near total evacuation of pneumothorax and re-expansion of the lung.  Trace residual anterobasal pneumothorax remains.  Signed,  Sterling Big, MD Vascular & Interventional Radiologist [facility]   Original Report Authenticated By: Malachy Moan, M.D.    Anti-infectives: Anti-infectives   Start     Dose/Rate Route Frequency Ordered Stop   10/13/12 1000  cephALEXin (KEFLEX) capsule 500 mg  500 mg Oral Every 12 hours 10/13/12 0951     10/13/12 0000  cephALEXin (KEFLEX) 500 MG capsule     500 mg Oral Every 12 hours 10/13/12 0956     10/11/12 2200  ceFAZolin (ANCEF) IVPB 1 g/50 mL premix  Status:  Discontinued     1 g 100 mL/hr over 30 Minutes Intravenous 3 times per day 10/11/12 2014 10/13/12 0951   10/11/12 0600  ceFAZolin (ANCEF) IVPB 2 g/50 mL premix     2 g 100 mL/hr over 30 Minutes Intravenous On call to O.R. 10/10/12 1420 10/11/12 1628      Assessment Principal Problem:   Breast cancer, right breast-T1Nx s/p bilateral mastectomies and right axillary SLNBx with reconstruction  10/11/12 Active Problems:   Spontaneous pneumothorax-right:  Lung re-expanded.    LOS: 4 days   Plan: Chest tube to water seal and check CXR later today.   Sherry Stafford 10/15/2012

## 2012-10-15 NOTE — Progress Notes (Signed)
Post chest tube removal CXR demonstrates no ptx.  This was discussed with her.  Will obtain another CXR tomorrow and discharge her if no ptx is seen.

## 2012-10-15 NOTE — Progress Notes (Signed)
Subjective: Feels much better today. Nausea has resolved.  Objective: Vital signs in last 24 hours: Temp:  [98.3 F (36.8 C)-98.8 F (37.1 C)] 98.5 F (36.9 C) (07/21 0527) Pulse Rate:  [78-90] 78 (07/21 0527) Resp:  [18-20] 18 (07/21 0527) BP: (96-114)/(56-80) 96/80 mmHg (07/21 0527) SpO2:  [93 %-99 %] 96 % (07/21 0527)  Intake/Output from previous day: 07/20 0701 - 07/21 0700 In: 6112.5 [P.O.:480; I.V.:5632.5] Out: 185 [Drains:185] Intake/Output this shift:    Operative sites: Mastectomy flaps appear viable. Operative sites show no sign of bleeding or infection. Drains functioning. Drainage thin.  No results found for this basename: WBC, HGB, HCT, PLATELETS, NA, K, CL, CO2, BUN, CREATININE, GLU,  in the last 72 hours  Studies/Results: Dg Chest 1 View  10/13/2012   *RADIOLOGY REPORT*  Clinical Data: Repositioning of chest tube.  CHEST - 1 VIEW  Comparison: CT images of the chest this same point that seen into the chest this same date.  Findings: A new pigtail catheter is in place in the right hemithorax.  Small bore bilateral chest tubes remain in place. Right pneumothorax seen on the comparison examinations is no longer identified.  Right basilar atelectasis and a small effusion are noted.  Mild left basilar atelectasis is also noted.  Heart size upper normal.  IMPRESSION:  1.  Resolved right pneumothorax with a new right chest tube in place. 2.  Negative for left pneumothorax with a chest tube in place. 3.  Bibasilar atelectasis and small right effusion.   Original Report Authenticated By: Holley Dexter, M.D.   Dg Chest Port 1 View  10/14/2012   *RADIOLOGY REPORT*  Clinical Data: Pneumothorax  PORTABLE CHEST - 1 VIEW  Comparison: Prior radiograph from 10/13/2012  Findings: Right-sided pigtail catheter is in stable position with tip overlying the mid right lung base.  Bilateral chest tubes are in stable position.  The lungs remain hypoinflated.  No definite pneumothorax identified  on this semi upright projection.  Right basilar atelectasis is improved.  Patchy left basilar opacity persists, and likely represents atelectasis.  The heart again appears enlarged, which may be in part reflective of hypoinflation and AP projection.  IMPRESSION: 1.  No pneumothorax identified on this semi upright AP radiograph. Bilateral chest tubes and right pigtail chest catheter in place as above. 2.  Interval improvement in right basilar atelectasis with persistent left basilar atelectasis.  cheek   Original Report Authenticated By: Rise Mu, M.D.   Dg Chest Port 1 View  10/13/2012   *RADIOLOGY REPORT*  Clinical Data: Shortness of breath, status post bilateral mastectomy 2 days ago, hypoxia  PORTABLE CHEST - 1 VIEW  Comparison: 10/11/2012  Findings: Large right pneumothorax, new.  Left lung is clear.  Heart is normal in size.  Status post bilateral mastectomy with tissue expanders and bilateral chest wall drains.  IMPRESSION: Large right pneumothorax, new.  Critical Value/emergent results were called by telephone at the time of interpretation on 10/13/2012 at 1205 hours to Thomasene Mohair, the nurse caring for the patient, who verbally acknowledged these results.   Original Report Authenticated By: Charline Bills, M.D.   Ct Perc Pleural Drain W/indwell Cath W/img Guide  10/13/2012   *RADIOLOGY REPORT*  CT GUIDED PLACEMENT OF A PERCUTANEOUS THORACOSTOMY TUBE  Date: 10/13/2012  Clinical History: 57 year old female status post bilateral mastectomy with placement of tissue expanders.  She developed a large spontaneous pneumothorax today and requires urgent thoracostomy tube placement.  Due to her recent surgical incisions, existing soft tissue drains  and tissue expanders, interventional radiology was consulted for image-guided thoracostomy tube placement.  Procedures Performed: 1. CT guided placement of a right thoracostomy tube  Interventional Radiologist:  Sterling Big, MD  Sedation:  Moderate (conscious) sedation was used.  Two mg Versed, 75 mcg Fentanyl were administered intravenously.  The patient's vital signs were monitored continuously by radiology nursing throughout the procedure.  Sedation Time: 30 minutes  PROCEDURE/FINDINGS:   Informed consent was obtained from the patient following explanation of the procedure, risks, benefits and alternatives. The patient understands, agrees and consents for the procedure. All questions were addressed. A time out was performed.  Maximal barrier sterile technique utilized including caps, mask, sterile gowns, sterile gloves, large sterile drape, hand hygiene, and betadine skin prep.  A planning axial CT scan was performed.  There is a very large right-sided pneumothorax.  An appropriate skin entry site was selected and marked.  Local anesthesia was attained by infiltration with 1% lidocaine.  Under snap shot CT imaging guidance, and 18 gauge trocar needle was carefully advanced through the right lateral chest wall just anterior to the mid axillary line and into the pleural space.  A 0.035-inch wire was then advanced into the pleural space.  The skin tract was dilated to 10-French and a Cook 10-French all-purpose pigtail drainage catheter was advanced to the pleural space.  The catheter was connected to wall suction.  Post tube placement axial CT imaging demonstrates near total interval re- expansion of the lung with evacuation of pneumothorax.  There is trace residual anterobasal pneumothorax.  The thoracostomy tube is positioned anteriorly in the right base.  There is residual atelectasis in the right lower lobe.  IMPRESSION:  Technically successful CT guided placement of a 10-French percutaneous right thoracostomy tube.  The tube is connected to low wall suction.  There was near total evacuation of pneumothorax and re-expansion of the lung.  Trace residual anterobasal pneumothorax remains.  Signed,  Sterling Big, MD Vascular & Interventional  Radiologist [facility]   Original Report Authenticated By: Malachy Moan, M.D.    Assessment/Plan: Hopefully will be able to go to water seal for chest tube today.   LOS: 4 days    Jayvier Burgher M 10/15/2012 8:01 AM

## 2012-10-16 ENCOUNTER — Inpatient Hospital Stay (HOSPITAL_COMMUNITY): Payer: BC Managed Care – PPO

## 2012-10-16 ENCOUNTER — Encounter (HOSPITAL_COMMUNITY): Payer: Self-pay | Admitting: General Surgery

## 2012-10-16 MED ORDER — BACITRACIN-NEOMYCIN-POLYMYXIN 400-5-5000 EX OINT
TOPICAL_OINTMENT | CUTANEOUS | Status: AC
  Administered 2012-10-16: 1
  Filled 2012-10-16: qty 2

## 2012-10-16 NOTE — Progress Notes (Signed)
Subjective: Continues to feel better. Much less soreness.  Objective: Vital signs in last 24 hours: Temp:  [98.1 F (36.7 C)-99.7 F (37.6 C)] 98.1 F (36.7 C) (07/22 0548) Pulse Rate:  [71-90] 71 (07/22 0548) Resp:  [18-20] 18 (07/22 0548) BP: (83-119)/(56-65) 83/56 mmHg (07/22 0548) SpO2:  [94 %-97 %] 94 % (07/22 0548)  Intake/Output from previous day: 07/21 0701 - 07/22 0700 In: 100 [I.V.:100] Out: 167 [Drains:167] Intake/Output this shift:    Operative sites: Mastectomy flaps appear viable. No evidence of bleeding or infection. Drains functioning. Drainage thin.  No results found for this basename: WBC, HGB, HCT, PLATELETS, NA, K, CL, CO2, BUN, CREATININE, GLU,  in the last 72 hours  Studies/Results: Dg Chest 2 View  10/15/2012   *RADIOLOGY REPORT*  Clinical Data: Right pneumothorax.  Post recent bilateral mastectomy.  And right axillary dissection.  Hypertension.  CHEST - 2 VIEW  Comparison: 10/15/2012  Findings: A coped catheter remains on the right.  Bilateral tissue expanders are noted. No residual pneumothorax is seen.  Heart and mediastinal contours are within normal limits.  Low lung volumes persist but have improved slightly since the prior exam. Bibasilar subsegmental atelectasis is again seen.  The lungs otherwise appear clear with no signs of focal infiltrate or congestive failure.  No pleural fluid or significant peribronchial cuffing is seen.  Bony structures appear intact.  IMPRESSION: But improved lung volumes with persistent bibasilar volume loss. No residual pneumothorax.   Original Report Authenticated By: Rhodia Albright, M.D.   Dg Chest Port 1 View  10/15/2012   *RADIOLOGY REPORT*  Clinical Data: Status post chest tube removal  PORTABLE CHEST - 1 VIEW  Comparison: 10/15/2012 1413 hours  Findings: There is been interval removal of a right-sided chest tube.  No recurrent pneumothorax is noted.  Bilateral tissue expanders are identified as well as surgical drains.   No focal infiltrate is seen.  No new focal abnormality is noted.  IMPRESSION: No recurrent pneumothorax following chest tube removal on the right.   Original Report Authenticated By: Alcide Clever, M.D.   Dg Chest Port 1 View  10/15/2012   *RADIOLOGY REPORT*  Clinical Data: Short of breath  PORTABLE CHEST - 1 VIEW  Comparison: 10/14/2012  Findings: Low lung volumes with bibasilar atelectasis.  Right chest tube is stable.  No pneumothorax.  Normal heart size.   Surgical drains project over the right and left hemithoraces.  IMPRESSION: Stable right chest tube.  Stable bilateral surgical drains.  No pneumothorax.   Original Report Authenticated By: Jolaine Click, M.D.    Assessment/Plan: CXR this am. Result is pending. Hopefully will be able to go home if no pneumothorax. I will see her end of week or Monday to begin removing drains.   LOS: 5 days    Etter Sjogren M 10/16/2012 7:35 AM

## 2012-10-16 NOTE — Progress Notes (Signed)
5 Days Post-Op  Subjective: Ready to go home.    Objective: Vital signs in last 24 hours: Temp:  [98.1 F (36.7 C)-99.7 F (37.6 C)] 98.1 F (36.7 C) (07/22 0548) Pulse Rate:  [71-90] 71 (07/22 0548) Resp:  [18-20] 18 (07/22 0548) BP: (83-119)/(56-65) 83/56 mmHg (07/22 0548) SpO2:  [94 %-97 %] 94 % (07/22 0548) Last BM Date: 10/11/12  Intake/Output from previous day: 07/21 0701 - 07/22 0700 In: 100 [I.V.:100] Out: 167 [Drains:167] Intake/Output this shift:    PE: General- In NAD Chest-dressings dry, breath sounds equal and clear   Lab Results:  No results found for this basename: WBC, HGB, HCT, PLT,  in the last 72 hours BMET No results found for this basename: NA, K, CL, CO2, GLUCOSE, BUN, CREATININE, CALCIUM,  in the last 72 hours PT/INR No results found for this basename: LABPROT, INR,  in the last 72 hours Comprehensive Metabolic Panel:    Component Value Date/Time   NA 142 10/04/2012 1034   K 3.9 10/04/2012 1034   CL 107 10/04/2012 1034   CO2 26 10/04/2012 1034   BUN 13 10/04/2012 1034   CREATININE 0.94 10/04/2012 1034   GLUCOSE 87 10/04/2012 1034   CALCIUM 10.1 10/04/2012 1034   AST 15 10/04/2012 1034   ALT 16 10/04/2012 1034   ALKPHOS 55 10/04/2012 1034   BILITOT 0.2* 10/04/2012 1034   PROT 7.2 10/04/2012 1034   ALBUMIN 3.8 10/04/2012 1034     Studies/Results: Dg Chest 2 View  10/15/2012   *RADIOLOGY REPORT*  Clinical Data: Right pneumothorax.  Post recent bilateral mastectomy.  And right axillary dissection.  Hypertension.  CHEST - 2 VIEW  Comparison: 10/15/2012  Findings: A coped catheter remains on the right.  Bilateral tissue expanders are noted. No residual pneumothorax is seen.  Heart and mediastinal contours are within normal limits.  Low lung volumes persist but have improved slightly since the prior exam. Bibasilar subsegmental atelectasis is again seen.  The lungs otherwise appear clear with no signs of focal infiltrate or congestive failure.  No pleural  fluid or significant peribronchial cuffing is seen.  Bony structures appear intact.  IMPRESSION: But improved lung volumes with persistent bibasilar volume loss. No residual pneumothorax.   Original Report Authenticated By: Rhodia Albright, M.D.   Dg Chest Port 1 View  10/16/2012   *RADIOLOGY REPORT*  Clinical Data: Follow up mastectomy.  Chest tube removal.  PORTABLE CHEST - 1 VIEW  Comparison: 10/15/2012.  Findings: Bilateral tissue expanders are noted.  There appear to be soft tissue surgical drains overlying the breasts bilaterally. There is no pneumothorax.  The cardiopericardial silhouette appears within normal limits.  Elevation of the right hemidiaphragm. Bilateral right greater than left subsegmental atelectasis.  IMPRESSION: No acute cardiopulmonary disease.  Low lung volumes with basilar atelectasis.  Postsurgical changes of the breasts bilaterally.   Original Report Authenticated By: Andreas Newport, M.D.   Dg Chest Port 1 View  10/15/2012   *RADIOLOGY REPORT*  Clinical Data: Status post chest tube removal  PORTABLE CHEST - 1 VIEW  Comparison: 10/15/2012 1413 hours  Findings: There is been interval removal of a right-sided chest tube.  No recurrent pneumothorax is noted.  Bilateral tissue expanders are identified as well as surgical drains.  No focal infiltrate is seen.  No new focal abnormality is noted.  IMPRESSION: No recurrent pneumothorax following chest tube removal on the right.   Original Report Authenticated By: Alcide Clever, M.D.   Dg Chest [facility]  10/15/2012   *RADIOLOGY REPORT*  Clinical Data: Short of breath  PORTABLE CHEST - 1 VIEW  Comparison: 10/14/2012  Findings: Low lung volumes with bibasilar atelectasis.  Right chest tube is stable.  No pneumothorax.  Normal heart size.   Surgical drains project over the right and left hemithoraces.  IMPRESSION: Stable right chest tube.  Stable bilateral surgical drains.  No pneumothorax.   Original Report Authenticated By: Jolaine Click,  M.D.    Anti-infectives: Anti-infectives   Start     Dose/Rate Route Frequency Ordered Stop   10/13/12 1000  cephALEXin (KEFLEX) capsule 500 mg     500 mg Oral Every 12 hours 10/13/12 0951     10/13/12 0000  cephALEXin (KEFLEX) 500 MG capsule     500 mg Oral Every 12 hours 10/13/12 0956     10/11/12 2200  ceFAZolin (ANCEF) IVPB 1 g/50 mL premix  Status:  Discontinued     1 g 100 mL/hr over 30 Minutes Intravenous 3 times per day 10/11/12 2014 10/13/12 0951   10/11/12 0600  ceFAZolin (ANCEF) IVPB 2 g/50 mL premix     2 g 100 mL/hr over 30 Minutes Intravenous On call to O.R. 10/10/12 1420 10/11/12 1628      Assessment Principal Problem:   Breast cancer, right breast-T1Nx s/p bilateral mastectomies and right axillary SLNBx with reconstruction 10/11/12 Active Problems:   Spontaneous pneumothorax-right:  Chest tube out, CXR this AM shows no ptx    LOS: 5 days   Plan:  Discharge.  Instructions given.   Adolph Pollack 10/16/2012

## 2012-10-16 NOTE — Discharge Summary (Signed)
Patient discharged home in stable condition. Verbalizes understanding of all discharge instructions, including home medications and follow up appointments. 

## 2012-10-25 ENCOUNTER — Encounter: Payer: Self-pay | Admitting: *Deleted

## 2012-10-25 NOTE — Progress Notes (Signed)
Received Oncotype Dx results of 13.  Gave copy to MD.  Rochele Pages copy to Med Rec to scan.

## 2012-10-29 ENCOUNTER — Encounter (HOSPITAL_COMMUNITY): Payer: Self-pay | Admitting: Anatomic Pathology & Clinical Pathology

## 2012-10-30 ENCOUNTER — Other Ambulatory Visit: Payer: Self-pay | Admitting: Medical Oncology

## 2012-10-30 DIAGNOSIS — C50911 Malignant neoplasm of unspecified site of right female breast: Secondary | ICD-10-CM

## 2012-10-31 ENCOUNTER — Ambulatory Visit (HOSPITAL_BASED_OUTPATIENT_CLINIC_OR_DEPARTMENT_OTHER): Payer: BC Managed Care – PPO | Admitting: Oncology

## 2012-10-31 ENCOUNTER — Other Ambulatory Visit (HOSPITAL_BASED_OUTPATIENT_CLINIC_OR_DEPARTMENT_OTHER): Payer: BC Managed Care – PPO | Admitting: Lab

## 2012-10-31 VITALS — BP 114/74 | HR 92 | Temp 98.5°F | Resp 20 | Ht 67.0 in | Wt 175.6 lb

## 2012-10-31 DIAGNOSIS — C50911 Malignant neoplasm of unspecified site of right female breast: Secondary | ICD-10-CM

## 2012-10-31 DIAGNOSIS — Z170 Estrogen receptor positive status [ER+]: Secondary | ICD-10-CM

## 2012-10-31 DIAGNOSIS — C50919 Malignant neoplasm of unspecified site of unspecified female breast: Secondary | ICD-10-CM

## 2012-10-31 LAB — CBC WITH DIFFERENTIAL/PLATELET
BASO%: 1.2 % (ref 0.0–2.0)
Basophils Absolute: 0.1 10*3/uL (ref 0.0–0.1)
EOS%: 1.7 % (ref 0.0–7.0)
Eosinophils Absolute: 0.1 10*3/uL (ref 0.0–0.5)
HCT: 41.2 % (ref 34.8–46.6)
HGB: 14 g/dL (ref 11.6–15.9)
LYMPH%: 34.5 % (ref 14.0–49.7)
MCH: 31.1 pg (ref 25.1–34.0)
MCHC: 33.9 g/dL (ref 31.5–36.0)
MCV: 91.7 fL (ref 79.5–101.0)
MONO#: 0.5 10*3/uL (ref 0.1–0.9)
MONO%: 9.3 % (ref 0.0–14.0)
NEUT#: 2.9 10*3/uL (ref 1.5–6.5)
NEUT%: 53.3 % (ref 38.4–76.8)
Platelets: 359 10*3/uL (ref 145–400)
RBC: 4.5 10*6/uL (ref 3.70–5.45)
RDW: 13.1 % (ref 11.2–14.5)
WBC: 5.4 10*3/uL (ref 3.9–10.3)
lymph#: 1.9 10*3/uL (ref 0.9–3.3)

## 2012-10-31 LAB — COMPREHENSIVE METABOLIC PANEL (CC13)
ALT: 8 U/L (ref 0–55)
AST: 14 U/L (ref 5–34)
Albumin: 3.4 g/dL — ABNORMAL LOW (ref 3.5–5.0)
Alkaline Phosphatase: 41 U/L (ref 40–150)
BUN: 26.9 mg/dL — ABNORMAL HIGH (ref 7.0–26.0)
CO2: 23 mEq/L (ref 22–29)
Calcium: 10.1 mg/dL (ref 8.4–10.4)
Chloride: 111 mEq/L — ABNORMAL HIGH (ref 98–109)
Creatinine: 1.1 mg/dL (ref 0.6–1.1)
Glucose: 121 mg/dl (ref 70–140)
Potassium: 4.1 mEq/L (ref 3.5–5.1)
Sodium: 144 mEq/L (ref 136–145)
Total Bilirubin: 0.32 mg/dL (ref 0.20–1.20)
Total Protein: 7.2 g/dL (ref 6.4–8.3)

## 2012-11-06 ENCOUNTER — Ambulatory Visit (INDEPENDENT_AMBULATORY_CARE_PROVIDER_SITE_OTHER): Payer: BC Managed Care – PPO | Admitting: General Surgery

## 2012-11-07 ENCOUNTER — Encounter (INDEPENDENT_AMBULATORY_CARE_PROVIDER_SITE_OTHER): Payer: Self-pay | Admitting: General Surgery

## 2012-11-07 ENCOUNTER — Ambulatory Visit (INDEPENDENT_AMBULATORY_CARE_PROVIDER_SITE_OTHER): Payer: BC Managed Care – PPO | Admitting: General Surgery

## 2012-11-07 VITALS — BP 102/70 | HR 86 | Temp 97.9°F | Resp 16 | Ht 67.0 in | Wt 177.2 lb

## 2012-11-07 DIAGNOSIS — Z9889 Other specified postprocedural states: Secondary | ICD-10-CM

## 2012-11-07 NOTE — Progress Notes (Signed)
Procedure:  Bilateral mastectomies with right axillary sentinel lymph node biopsy and placement of implants.  Date:  10/12/2038  Pathology:  T1N0  History:  She is here for a postoperative visit. She has some swelling and soreness. No shortness of breath.  Exam: General- Is in NAD. Chest-bilateral chest wall scars are clean and intact. Right axillary scar is clean and intact.  Assessment: Invasive right breast cancer status post bilateral mastectomies with right axillary sentinel lymph node biopsy. Wounds are healing well.  Plan:  Return visit 3 months.

## 2012-11-07 NOTE — Patient Instructions (Signed)
Call if you find any discrete, hard nodules on your chest wall.

## 2012-11-18 NOTE — Progress Notes (Signed)
OFFICE PROGRESS NOTE  CC**  [name], MD [address] [place] [address] Dr. Avel Peace  Dr. Lurline Hare  DIAGNOSIS: 57 year old female with new diagnosis of screen detected right breast cancer measuring 1.6 cm.  Patient was seen in the Multidisciplinary Breast Clinic   STAGE:  Breast cancer, right breast-T1Nx  Primary site: Breast (Right)  Staging method: AJCC 7th Edition  Clinical: Stage IA (T1c, N0, cM0)  Summary: Stage IA (T1c, N0, cM0)  PRIOR THERAPY: #1 Patient had a screening mammogram performed 07/17/2012. She was found to have a possible mass and calcifications in the right breast. She went on to have Spot magnification images performed that showed 8 mm ill defined spiculated mass with a few associated microcalcifications over the outer mid right breast. There were also 2 mm group of microcalcifications located 7 mm anterior to the spiculated mass. There were several other groups of microcalcifications within the upper outer right breast which were stable. She went nontender have a ultrasound performed that showed a irregular hypoechoic mass with shadowing at the 9:00 position of the right breast 5 cm from the nipple measuring 7 x 7 x 7 mm. Adjacent group of indeterminate microcalcifications 7 mm anterior to this mass were also noted. Patient was recommended biopsy of the mass. This was performed on 07/31/2012. 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 Tumor was ER positive PR positive HER-2/neu negative with Ki-67 5%. She went on to have MRI of the bilateral breasts performed on 08/07/2012. The MRI revealed in the right breast 1.3 x 0.9 x 1.6 cm irregular enhancing spiculated mass in the middle third of the lateral aspect of the right breast with associated biopsy changes. At 12:00 in the anterior third of the right breast was another enhancing mass measuring 7 x 6 x 5 mm. Patient was recommended biopsy of  the 12:00 mass  #2 patient is now status post bilateral mastectomies with right axillary sentinel lymph node biopsy and placement of implant. Surgery performed on 10/11/2012. Final pathology revealed a T1 N0 disease. Postoperatively she is doing well.  #3 we discussed antiestrogen therapy. Risks and benefits of this were discussed with the patient. A prescription for tamoxifen was given to the patient to take 20 mg daily.  CURRENT THERAPY: tamoxifen 20 mg daily  INTERVAL HISTORY: Sherry Stafford 57 y.o. female returns for followup post mastectomies. Clinically she seems to be doing well without any problems she denies any fevers chills night sweats headaches shortness of breath chest pains palpitations her mastectomy site looks really good and healing very well no evidence of infections. Remainder of the 10 point review of systems is negative.  MEDICAL HISTORY: Past Medical History  Diagnosis Date  . Hyperlipidemia   . Depression   . Anxiety   . GERD (gastroesophageal reflux disease)   . Breast cancer   . Hot flashes   . Hypertension     stress test 5 yrs. ago or > , states it was wnl, no need for cardiac f/u, states her BP is managed by Dr. Juleen China  . Arthritis     C5-6- bone spurs- treated by Chiropratic , achiness in hips & legs     ALLERGIES:  has No Known Allergies.  MEDICATIONS:  Current Outpatient Prescriptions  Medication Sig Dispense Refill  . ACZONE 5 % topical gel Apply 1 application topically 2 (two) times daily as needed (for breakouts).       Marland Kitchen buPROPion (WELLBUTRIN XL)  150 MG 24 hr tablet Take 150 mg by mouth daily with breakfast.       . CALCIUM PO Take 0.5 tablets by mouth daily.      Marland Kitchen docusate sodium 100 MG CAPS Take 100 mg by mouth 2 (two) times daily.  30 capsule  0  . fluticasone (FLONASE) 50 MCG/ACT nasal spray Place 2 sprays into the nose daily as needed for allergies.       Marland Kitchen HYDROmorphone (DILAUDID) 2 MG tablet Take 1-2 tablets (2-4 mg total) by mouth every 4  (four) hours as needed.  40 tablet  0  . LORazepam (ATIVAN) 0.5 MG tablet       . LOVAZA 1 G capsule       . methocarbamol (ROBAXIN) 500 MG tablet Take 1 tablet (500 mg total) by mouth every 6 (six) hours as needed.  40 tablet  1  . omeprazole (PRILOSEC) 40 MG capsule       . rosuvastatin (CRESTOR) 5 MG tablet Take 5 mg by mouth daily with breakfast.       . tamoxifen (NOLVADEX) 20 MG tablet Take 20 mg by mouth daily with breakfast.      . amLODipine (NORVASC) 5 MG tablet Take 5 mg by mouth daily with breakfast.       . metoCLOPramide (REGLAN) 10 MG tablet Take 1 tablet (10 mg total) by mouth 4 (four) times daily.  20 tablet  0   No current facility-administered medications for this visit.    SURGICAL HISTORY:  Past Surgical History  Procedure Laterality Date  . Tubal ligation    . Breast surgery Left 2006    open biopsy (benign), R breast- stereotactic core biopsy- R breast- 07/2012  . Appendectomy      as a teen   . Blepheroplasy      bilateral   . Mastectomy w/ sentinel node biopsy Right 10/11/2012    Procedure:  bilateral MASTECTOMY WITH right  SENTINEL LYMPH NODE BIOPSY;  Surgeon: Adolph Pollack, MD;  Location: [facility] OR;  Service: General;  Laterality: Right;  right nuclear medicine injection 10:45 dr Odis Luster to follow with reconstruction time alloted   . Total mastectomy Left 10/11/2012    Procedure: TOTAL MASTECTOMY;  Surgeon: Adolph Pollack, MD;  Location: [facility] OR;  Service: General;  Laterality: Left;  . Breast reconstruction with placement of tissue expander and flex hd (acellular hydrated dermis) Bilateral 10/11/2012    Procedure: BREAST RECONSTRUCTION WITH PLACEMENT OF TISSUE EXPANDER AND FLEX HD (ACELLULAR HYDRATED DERMIS);  Surgeon: Etter Sjogren, MD;  Location: [facility] OR;  Service: Plastics;  Laterality: Bilateral;    REVIEW OF SYSTEMS:  Pertinent items are noted in HPI.   HEALTH MAINTENANCE:  PHYSICAL EXAMINATION: Blood pressure 114/74, pulse 92, temperature 98.5 F (36.9  C), temperature source Oral, resp. rate 20, height 5\' 7"  (1.702 m), weight 175 lb 9.6 oz (79.652 kg). Body mass index is 27.5 kg/(m^2). ECOG PERFORMANCE STATUS: 0 - Asymptomatic   General appearance: alert, cooperative and appears stated age Resp: clear to auscultation bilaterally Cardio: regular rate and rhythm GI: soft, non-tender; bowel sounds normal; no masses,  no organomegaly Extremities: extremities normal, atraumatic, no cyanosis or edema Neurologic: Grossly normal   LABORATORY DATA: Lab Results  Component Value Date   WBC 5.4 10/31/2012   HGB 14.0 10/31/2012   HCT 41.2 10/31/2012   MCV 91.7 10/31/2012   PLT 359 10/31/2012      Chemistry      Component Value  Date/Time   NA 144 10/31/2012 1153   NA 142 10/04/2012 1034   K 4.1 10/31/2012 1153   K 3.9 10/04/2012 1034   CL 107 10/04/2012 1034   CO2 23 10/31/2012 1153   CO2 26 10/04/2012 1034   BUN 26.9* 10/31/2012 1153   BUN 13 10/04/2012 1034   CREATININE 1.1 10/31/2012 1153   CREATININE 0.94 10/04/2012 1034      Component Value Date/Time   CALCIUM 10.1 10/31/2012 1153   CALCIUM 10.1 10/04/2012 1034   ALKPHOS 41 10/31/2012 1153   ALKPHOS 55 10/04/2012 1034   AST 14 10/31/2012 1153   AST 15 10/04/2012 1034   ALT 8 10/31/2012 1153   ALT 16 10/04/2012 1034   BILITOT 0.32 10/31/2012 1153   BILITOT 0.2* 10/04/2012 1034       RADIOGRAPHIC STUDIES:  No results found.  ASSESSMENT: 57 year old female with  #1 history of breast cancer status post bilateral mastectomies with immediate reconstruction. Her tumor was ER positive. Postoperatively she is doing well. She and I discussed adjuvant antiestrogen therapy with tamoxifen 20 mg daily. We discussed risks benefits and side effects of her treatment. She is agreeable to starting this. Prescription was sent to her pharmacy.   PLAN:   #1 proceed with tamoxifen 20 mg daily.  #2 I will see her back in 3 months time for followup   All questions were answered. The patient knows to call the clinic with  any problems, questions or concerns. We can certainly see the patient much sooner if necessary.  I spent 25 minutes counseling the patient face to face. The total time spent in the appointment was 30 minutes.    Drue Second, MD Medical/Oncology [facility] [phone] (beeper) [phone] (Office)

## 2012-11-29 ENCOUNTER — Telehealth: Payer: Self-pay | Admitting: *Deleted

## 2012-11-29 NOTE — Telephone Encounter (Signed)
Per Dr. Abbey Chatters release given for pt to have massage.  Letter sent to pt.  Informed pt letter is in the mail.  Received verbal understanding.

## 2012-12-13 ENCOUNTER — Encounter: Payer: Self-pay | Admitting: *Deleted

## 2012-12-13 DIAGNOSIS — C50919 Malignant neoplasm of unspecified site of unspecified female breast: Secondary | ICD-10-CM

## 2012-12-18 ENCOUNTER — Telehealth: Payer: Self-pay | Admitting: Oncology

## 2012-12-18 NOTE — Telephone Encounter (Signed)
lvm for pt regarding to nove appt.Sherry KitchenMarland Stafford

## 2013-02-06 ENCOUNTER — Ambulatory Visit (INDEPENDENT_AMBULATORY_CARE_PROVIDER_SITE_OTHER): Payer: BC Managed Care – PPO | Admitting: General Surgery

## 2013-02-06 ENCOUNTER — Other Ambulatory Visit: Payer: Self-pay | Admitting: Ophthalmology

## 2013-02-06 ENCOUNTER — Encounter (INDEPENDENT_AMBULATORY_CARE_PROVIDER_SITE_OTHER): Payer: Self-pay | Admitting: General Surgery

## 2013-02-06 ENCOUNTER — Encounter (INDEPENDENT_AMBULATORY_CARE_PROVIDER_SITE_OTHER): Payer: Self-pay

## 2013-02-06 VITALS — BP 104/76 | HR 88 | Temp 97.8°F | Resp 16 | Ht 67.0 in | Wt 185.2 lb

## 2013-02-06 DIAGNOSIS — C50911 Malignant neoplasm of unspecified site of right female breast: Secondary | ICD-10-CM

## 2013-02-06 DIAGNOSIS — C50919 Malignant neoplasm of unspecified site of unspecified female breast: Secondary | ICD-10-CM

## 2013-02-06 NOTE — Progress Notes (Signed)
Procedure:  Bilateral mastectomies with right axillary sentinel lymph node biopsy and placement of implants.  Date:  10/11/2012  Pathology:  T1N0  History: she is here for a long-term followup visit of her right breast cancer. She has bilateral tissue expanders in and they're causing her some discomfort. She denies any new nodules in the chest wall around her arms. She's noted a little change in the scar in the right axilla area. She's wondering if it is an ingrown hair.  Exam: General- Is in NAD. Chest-bilateral chest wall scars are clean and intact. Right axillary scar is clean and intact; it does demonstrate a little bit of prominence laterally but no purulent drainage. No overt signs of infection.  Assessment: Invasive right breast cancer status post bilateral mastectomies with right axillary sentinel lymph node biopsy-no clinical evidence of recurrence. Right axillary scar does not look overtly infected.  Plan:  Return visit 3 months. Clean right axilla with chlorhexidine soap for one week.

## 2013-02-06 NOTE — Patient Instructions (Signed)
Clean the area in the right underarm with the soap you have as we discussed daily for one week.  Call if you notice any new nodules in your chest wall or under your arms.

## 2013-02-18 ENCOUNTER — Other Ambulatory Visit (HOSPITAL_BASED_OUTPATIENT_CLINIC_OR_DEPARTMENT_OTHER): Payer: BC Managed Care – PPO | Admitting: Lab

## 2013-02-18 ENCOUNTER — Ambulatory Visit (HOSPITAL_BASED_OUTPATIENT_CLINIC_OR_DEPARTMENT_OTHER): Payer: BC Managed Care – PPO | Admitting: Oncology

## 2013-02-18 ENCOUNTER — Encounter: Payer: Self-pay | Admitting: Oncology

## 2013-02-18 ENCOUNTER — Telehealth: Payer: Self-pay | Admitting: Oncology

## 2013-02-18 VITALS — BP 133/86 | HR 91 | Temp 98.4°F | Resp 18 | Ht 67.0 in | Wt 184.1 lb

## 2013-02-18 DIAGNOSIS — C50911 Malignant neoplasm of unspecified site of right female breast: Secondary | ICD-10-CM

## 2013-02-18 DIAGNOSIS — C50919 Malignant neoplasm of unspecified site of unspecified female breast: Secondary | ICD-10-CM

## 2013-02-18 LAB — CBC WITH DIFFERENTIAL/PLATELET
BASO%: 1.1 % (ref 0.0–2.0)
Basophils Absolute: 0.1 10*3/uL (ref 0.0–0.1)
EOS%: 1.1 % (ref 0.0–7.0)
Eosinophils Absolute: 0.1 10*3/uL (ref 0.0–0.5)
HCT: 40.9 % (ref 34.8–46.6)
HGB: 13.8 g/dL (ref 11.6–15.9)
LYMPH%: 40.4 % (ref 14.0–49.7)
MCH: 30.3 pg (ref 25.1–34.0)
MCHC: 33.7 g/dL (ref 31.5–36.0)
MCV: 89.9 fL (ref 79.5–101.0)
MONO#: 0.5 10*3/uL (ref 0.1–0.9)
MONO%: 9.6 % (ref 0.0–14.0)
NEUT#: 2.5 10*3/uL (ref 1.5–6.5)
NEUT%: 47.8 % (ref 38.4–76.8)
Platelets: 262 10*3/uL (ref 145–400)
RBC: 4.55 10*6/uL (ref 3.70–5.45)
RDW: 13.1 % (ref 11.2–14.5)
WBC: 5.1 10*3/uL (ref 3.9–10.3)
lymph#: 2.1 10*3/uL (ref 0.9–3.3)

## 2013-02-18 LAB — COMPREHENSIVE METABOLIC PANEL (CC13)
ALT: 15 U/L (ref 0–55)
AST: 17 U/L (ref 5–34)
Albumin: 3.5 g/dL (ref 3.5–5.0)
Alkaline Phosphatase: 43 U/L (ref 40–150)
Anion Gap: 8 mEq/L (ref 3–11)
BUN: 17 mg/dL (ref 7.0–26.0)
CO2: 24 mEq/L (ref 22–29)
Calcium: 9.4 mg/dL (ref 8.4–10.4)
Chloride: 112 mEq/L — ABNORMAL HIGH (ref 98–109)
Creatinine: 0.9 mg/dL (ref 0.6–1.1)
Glucose: 107 mg/dl (ref 70–140)
Potassium: 4.1 mEq/L (ref 3.5–5.1)
Sodium: 144 mEq/L (ref 136–145)
Total Bilirubin: <0.2 mg/dL (ref 0.20–1.20)
Total Protein: 6.9 g/dL (ref 6.4–8.3)

## 2013-02-18 NOTE — Progress Notes (Signed)
OFFICE PROGRESS NOTE  CC**  Sherry Sites, MD [address] [place] [address] Dr. Avel Stafford  Dr. Lurline Stafford  DIAGNOSIS: 57 year old female with new diagnosis of screen detected right breast cancer measuring 1.6 cm. Patient was seen in the Multidisciplinary Breast Clinic   STAGE:  Breast cancer, right breast-T1Nx  Primary site: Breast (Right)  Staging method: AJCC 7th Edition  Clinical: Stage IA (T1c, N0, cM0)  Summary: Stage IA (T1c, N0, cM0)  PRIOR THERAPY:  #1 Patient had a screening mammogram performed 07/17/2012. She was found to have a possible mass and calcifications in the right breast. She went on to have Spot magnification images performed that showed 8 mm ill defined spiculated mass with a few associated microcalcifications over the outer mid right breast. There were also 2 mm group of microcalcifications located 7 mm anterior to the spiculated mass. There were several other groups of microcalcifications within the upper outer right breast which were stable. She went nontender have a ultrasound performed that showed a irregular hypoechoic mass with shadowing at the 9:00 position of the right breast 5 cm from the nipple measuring 7 x 7 x 7 mm. Adjacent group of indeterminate microcalcifications 7 mm anterior to this mass were also noted. Patient was recommended biopsy of the mass. This was performed on 07/31/2012. 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 Tumor was ER positive PR positive HER-2/neu negative with Ki-67 5%. She went on to have MRI of the bilateral breasts performed on 08/07/2012. The MRI revealed in the right breast 1.3 x 0.9 x 1.6 cm irregular enhancing spiculated mass in the middle third of the lateral aspect of the right breast with associated biopsy changes. At 12:00 in the anterior third of the right breast was another enhancing mass measuring 7 x 6 x 5 mm. Patient was recommended biopsy of  the 12:00 mass  #2 patient is now status post bilateral mastectomies with right axillary sentinel lymph node biopsy and placement of implant. Surgery performed on 10/11/2012. Final pathology revealed a T1 N0 disease. Postoperatively she is doing well.  #3 we discussed antiestrogen therapy. Risks and benefits of this were discussed with the patient. A prescription for tamoxifen was given to the patient to take 20 mg daily.  CURRENT THERAPY: curative intent tamoxifen 20 mg daily  INTERVAL HISTORY: Sherry Stafford 57 y.o. female returns for followup post mastectomies. Clinically she seems to be doing well without any problems she denies any fevers chills night sweats headaches shortness of breath chest pains palpitations her mastectomy site looks really good and healing very well no evidence of infections. Remainder of the 10 point review of systems is negative.  MEDICAL HISTORY: Past Medical History  Diagnosis Date  . Hyperlipidemia   . Depression   . Anxiety   . GERD (gastroesophageal reflux disease)   . Breast cancer   . Hot flashes   . Hypertension     stress test 5 yrs. ago or > , states it was wnl, no need for cardiac f/u, states her BP is managed by Dr. Juleen Stafford  . Arthritis     C5-6- bone spurs- treated by Chiropratic , achiness in hips & legs     ALLERGIES:  has No Known Allergies.  MEDICATIONS:  Current Outpatient Prescriptions  Medication Sig Dispense Refill  . ACZONE 5 % topical gel Apply 1 application topically 2 (two) times daily as needed (for breakouts).       Marland Kitchen amLODipine (  NORVASC) 5 MG tablet Take 5 mg by mouth daily with breakfast.       . buPROPion (WELLBUTRIN XL) 150 MG 24 hr tablet Take 150 mg by mouth daily with breakfast.       . CALCIUM PO Take 0.5 tablets by mouth daily.      Marland Kitchen docusate sodium 100 MG CAPS Take 100 mg by mouth 2 (two) times daily.  30 capsule  0  . fluticasone (FLONASE) 50 MCG/ACT nasal spray Place 2 sprays into the nose daily as needed for  allergies.       . furosemide (LASIX) 20 MG tablet       . HYDROmorphone (DILAUDID) 2 MG tablet Take 1-2 tablets (2-4 mg total) by mouth every 4 (four) hours as needed.  40 tablet  0  . LORazepam (ATIVAN) 0.5 MG tablet       . LOVAZA 1 G capsule       . methocarbamol (ROBAXIN) 500 MG tablet Take 1 tablet (500 mg total) by mouth every 6 (six) hours as needed.  40 tablet  1  . omeprazole (PRILOSEC) 40 MG capsule       . rosuvastatin (CRESTOR) 5 MG tablet Take 5 mg by mouth daily with breakfast.       . tamoxifen (NOLVADEX) 20 MG tablet Take 20 mg by mouth daily with breakfast.       No current facility-administered medications for this visit.    SURGICAL HISTORY:  Past Surgical History  Procedure Laterality Date  . Tubal ligation    . Breast surgery Left 2006    open biopsy (benign), R breast- stereotactic core biopsy- R breast- 07/2012  . Appendectomy      as a teen   . Blepheroplasy      bilateral   . Mastectomy w/ sentinel node biopsy Right 10/11/2012    Procedure:  bilateral MASTECTOMY WITH right  SENTINEL LYMPH NODE BIOPSY;  Surgeon: Sherry Pollack, MD;  Location: [facility] OR;  Service: General;  Laterality: Right;  right nuclear medicine injection 10:45 dr Sherry Stafford to follow with reconstruction time alloted   . Total mastectomy Left 10/11/2012    Procedure: TOTAL MASTECTOMY;  Surgeon: Sherry Pollack, MD;  Location: [facility] OR;  Service: General;  Laterality: Left;  . Breast reconstruction with placement of tissue expander and flex hd (acellular hydrated dermis) Bilateral 10/11/2012    Procedure: BREAST RECONSTRUCTION WITH PLACEMENT OF TISSUE EXPANDER AND FLEX HD (ACELLULAR HYDRATED DERMIS);  Surgeon: Sherry Sjogren, MD;  Location: [facility] OR;  Service: Plastics;  Laterality: Bilateral;    REVIEW OF SYSTEMS:  Pertinent items are noted in HPI.   HEALTH MAINTENANCE:  PHYSICAL EXAMINATION: Blood pressure 133/86, pulse 91, temperature 98.4 F (36.9 C), temperature source Oral, resp. rate 18,  height 5\' 7"  (1.702 m), weight 184 lb 1.6 oz (83.507 kg). Body mass index is 28.83 kg/(m^2). ECOG PERFORMANCE STATUS: 0 - Asymptomatic   General appearance: alert, cooperative and appears stated age Resp: clear to auscultation bilaterally Cardio: regular rate and rhythm GI: soft, non-tender; bowel sounds normal; no masses,  no organomegaly Extremities: extremities normal, atraumatic, no cyanosis or edema Neurologic: Grossly normal   LABORATORY DATA: Lab Results  Component Value Date   WBC 5.1 02/18/2013   HGB 13.8 02/18/2013   HCT 40.9 02/18/2013   MCV 89.9 02/18/2013   PLT 262 02/18/2013      Chemistry      Component Value Date/Time   NA 144 10/31/2012 1153   NA  142 10/04/2012 1034   K 4.1 10/31/2012 1153   K 3.9 10/04/2012 1034   CL 107 10/04/2012 1034   CO2 23 10/31/2012 1153   CO2 26 10/04/2012 1034   BUN 26.9* 10/31/2012 1153   BUN 13 10/04/2012 1034   CREATININE 1.1 10/31/2012 1153   CREATININE 0.94 10/04/2012 1034      Component Value Date/Time   CALCIUM 10.1 10/31/2012 1153   CALCIUM 10.1 10/04/2012 1034   ALKPHOS 41 10/31/2012 1153   ALKPHOS 55 10/04/2012 1034   AST 14 10/31/2012 1153   AST 15 10/04/2012 1034   ALT 8 10/31/2012 1153   ALT 16 10/04/2012 1034   BILITOT 0.32 10/31/2012 1153   BILITOT 0.2* 10/04/2012 1034       RADIOGRAPHIC STUDIES:  No results found.  ASSESSMENT: 57 year old female with  #1 history of breast cancer status post bilateral mastectomies with immediate reconstruction. Her tumor was ER positive. Postoperatively she is doing well. She and I discussed adjuvant antiestrogen therapy with tamoxifen 20 mg daily. We discussed risks benefits and side effects of her treatment. She is agreeable to starting this. Prescription was sent to her pharmacy.   PLAN:   #1 continue tamoxifen 20 mg daily.  #2 I will see her back in 6 months time for followup   All questions were answered. The patient knows to call the clinic with any problems, questions or concerns.  We can certainly see the patient much sooner if necessary.  I spent 15 minutes counseling the patient face to face. The total time spent in the appointment was 20 minutes.    Drue Second, MD Medical/Oncology [facility] [phone] (beeper) [phone] (Office)

## 2013-04-09 HISTORY — PX: BREAST RECONSTRUCTION: SHX9

## 2013-04-16 ENCOUNTER — Encounter (INDEPENDENT_AMBULATORY_CARE_PROVIDER_SITE_OTHER): Payer: Self-pay | Admitting: General Surgery

## 2013-05-22 ENCOUNTER — Encounter (INDEPENDENT_AMBULATORY_CARE_PROVIDER_SITE_OTHER): Payer: Self-pay | Admitting: General Surgery

## 2013-05-22 ENCOUNTER — Ambulatory Visit (INDEPENDENT_AMBULATORY_CARE_PROVIDER_SITE_OTHER): Payer: BC Managed Care – PPO | Admitting: General Surgery

## 2013-05-22 ENCOUNTER — Encounter (INDEPENDENT_AMBULATORY_CARE_PROVIDER_SITE_OTHER): Payer: Self-pay

## 2013-05-22 VITALS — BP 128/76 | HR 80 | Temp 98.2°F | Resp 16 | Ht 67.0 in | Wt 184.6 lb

## 2013-05-22 DIAGNOSIS — C50919 Malignant neoplasm of unspecified site of unspecified female breast: Secondary | ICD-10-CM

## 2013-05-22 NOTE — Progress Notes (Signed)
Procedure:  Bilateral mastectomies with right axillary sentinel lymph node biopsy and placement of implants.  Date:  10/11/2012  Pathology:  T1N0  History: she is here for a long-term followup visit of her right breast cancer. She has bilateral breast reconstruction and has some intermittent discomfort from this. She denies any new nodules in the chest wall.   No adenopathy. Exam: General- Is in NAD. Chest-bilateral chest wall scars are clean and intact.  No palpable nodules.  Lymph nodes-no palpable cervical, supraclavicular, or axillary adenopathy  Assessment: Invasive right breast cancer status post bilateral mastectomies with right axillary sentinel lymph node biopsy and reconstruction-No clinical evidence or recurrence  Plan:  Return visit 3 months.

## 2013-05-22 NOTE — Patient Instructions (Signed)
Call if you feel any hard nodules on your chest wall.

## 2013-07-25 ENCOUNTER — Encounter (INDEPENDENT_AMBULATORY_CARE_PROVIDER_SITE_OTHER): Payer: Self-pay | Admitting: General Surgery

## 2013-08-16 ENCOUNTER — Other Ambulatory Visit: Payer: Self-pay | Admitting: Oncology

## 2013-08-22 ENCOUNTER — Ambulatory Visit (INDEPENDENT_AMBULATORY_CARE_PROVIDER_SITE_OTHER): Payer: BC Managed Care – PPO | Admitting: General Surgery

## 2013-08-22 ENCOUNTER — Encounter (INDEPENDENT_AMBULATORY_CARE_PROVIDER_SITE_OTHER): Payer: Self-pay | Admitting: General Surgery

## 2013-08-22 VITALS — BP 132/84 | HR 82 | Temp 97.6°F | Resp 16 | Ht 67.0 in | Wt 189.6 lb

## 2013-08-22 DIAGNOSIS — C50919 Malignant neoplasm of unspecified site of unspecified female breast: Secondary | ICD-10-CM

## 2013-08-22 NOTE — Progress Notes (Signed)
Procedure:  Bilateral mastectomies with right axillary sentinel lymph node biopsy and placement of implants.  Date:  10/11/2012  Pathology:  T1N0  History: She is here for aother long-term followup visit of her right breast cancer. She has bilateral breast reconstruction and still has some intermittent discomfort from this. She denies any new nodules in the chest wall.   No adenopathy.  She is going to a Set designer now and is planning on having nipple reconstruction.  She has some weight gain and wonders if this is from the tamoxifen. Exam: General- Is in NAD. Chest-bilateral chest wall scars are clean and intact.  No palpable nodules.  Lymph nodes-no palpable cervical, supraclavicular, or axillary adenopathy  Assessment: Invasive right breast cancer status post bilateral mastectomies with right axillary sentinel lymph node biopsy and reconstruction-No clinical evidence or recurrence.  She is overdue for her visit to the cancer center.  Plan:  Return visit 3 months.  Will contact the cancer center regarding the need for an appointment.

## 2013-08-22 NOTE — Patient Instructions (Signed)
Call if you find any new lumps in your breasts or chest wall. 

## 2013-08-30 ENCOUNTER — Other Ambulatory Visit: Payer: Self-pay | Admitting: Obstetrics and Gynecology

## 2013-08-30 DIAGNOSIS — C50919 Malignant neoplasm of unspecified site of unspecified female breast: Secondary | ICD-10-CM

## 2013-08-30 DIAGNOSIS — N951 Menopausal and female climacteric states: Secondary | ICD-10-CM

## 2013-08-30 DIAGNOSIS — M899 Disorder of bone, unspecified: Secondary | ICD-10-CM

## 2013-08-30 DIAGNOSIS — M949 Disorder of cartilage, unspecified: Secondary | ICD-10-CM

## 2013-09-10 ENCOUNTER — Encounter: Payer: Self-pay | Admitting: *Deleted

## 2013-09-11 ENCOUNTER — Telehealth: Payer: Self-pay | Admitting: Oncology

## 2013-09-11 NOTE — Telephone Encounter (Signed)
per pof tocall pt and leave time & date for appt-cld & left message

## 2013-09-17 ENCOUNTER — Ambulatory Visit (HOSPITAL_BASED_OUTPATIENT_CLINIC_OR_DEPARTMENT_OTHER): Payer: BC Managed Care – PPO | Admitting: Family

## 2013-09-17 ENCOUNTER — Other Ambulatory Visit (HOSPITAL_BASED_OUTPATIENT_CLINIC_OR_DEPARTMENT_OTHER): Payer: BC Managed Care – PPO

## 2013-09-17 ENCOUNTER — Telehealth: Payer: Self-pay | Admitting: Family

## 2013-09-17 ENCOUNTER — Telehealth: Payer: Self-pay | Admitting: *Deleted

## 2013-09-17 ENCOUNTER — Encounter: Payer: Self-pay | Admitting: Nurse Practitioner

## 2013-09-17 VITALS — BP 133/90 | HR 79 | Temp 98.4°F | Resp 20 | Ht 67.0 in | Wt 189.2 lb

## 2013-09-17 DIAGNOSIS — C50919 Malignant neoplasm of unspecified site of unspecified female breast: Secondary | ICD-10-CM

## 2013-09-17 DIAGNOSIS — Z170 Estrogen receptor positive status [ER+]: Secondary | ICD-10-CM

## 2013-09-17 DIAGNOSIS — R61 Generalized hyperhidrosis: Secondary | ICD-10-CM

## 2013-09-17 DIAGNOSIS — C50911 Malignant neoplasm of unspecified site of right female breast: Secondary | ICD-10-CM

## 2013-09-17 LAB — COMPREHENSIVE METABOLIC PANEL (CC13)
ALT: 17 U/L (ref 0–55)
AST: 15 U/L (ref 5–34)
Albumin: 3.6 g/dL (ref 3.5–5.0)
Alkaline Phosphatase: 46 U/L (ref 40–150)
Anion Gap: 9 mEq/L (ref 3–11)
BUN: 20.1 mg/dL (ref 7.0–26.0)
CO2: 22 mEq/L (ref 22–29)
Calcium: 9.7 mg/dL (ref 8.4–10.4)
Chloride: 112 mEq/L — ABNORMAL HIGH (ref 98–109)
Creatinine: 0.9 mg/dL (ref 0.6–1.1)
Glucose: 88 mg/dl (ref 70–140)
Potassium: 4 mEq/L (ref 3.5–5.1)
Sodium: 142 mEq/L (ref 136–145)
Total Bilirubin: <0.2 mg/dL (ref 0.20–1.20)
Total Protein: 7.1 g/dL (ref 6.4–8.3)

## 2013-09-17 LAB — CBC WITH DIFFERENTIAL/PLATELET
BASO%: 1.1 % (ref 0.0–2.0)
Basophils Absolute: 0.1 10*3/uL (ref 0.0–0.1)
EOS%: 2 % (ref 0.0–7.0)
Eosinophils Absolute: 0.1 10*3/uL (ref 0.0–0.5)
HCT: 43.4 % (ref 34.8–46.6)
HGB: 14.6 g/dL (ref 11.6–15.9)
LYMPH%: 39.6 % (ref 14.0–49.7)
MCH: 30.9 pg (ref 25.1–34.0)
MCHC: 33.6 g/dL (ref 31.5–36.0)
MCV: 91.9 fL (ref 79.5–101.0)
MONO#: 0.6 10*3/uL (ref 0.1–0.9)
MONO%: 9.2 % (ref 0.0–14.0)
NEUT#: 2.9 10*3/uL (ref 1.5–6.5)
NEUT%: 48.1 % (ref 38.4–76.8)
Platelets: 236 10*3/uL (ref 145–400)
RBC: 4.72 10*6/uL (ref 3.70–5.45)
RDW: 12.8 % (ref 11.2–14.5)
WBC: 6.1 10*3/uL (ref 3.9–10.3)
lymph#: 2.4 10*3/uL (ref 0.9–3.3)

## 2013-09-17 NOTE — Progress Notes (Signed)
[facility]  Telephone:([phone]) [phone] Fax:(336) [phone]  ID: Sherry Stafford OB: Jul 29, 1955 MR#: [id number] QAS#:[id number] PCP: Dwan Bolt, MD GYN:  SU:  OTHER MD:  CHIEF COMPLAINT:58 year old female with new diagnosis of screen detected right breast cancer measuring 1.6 cm. Patient was seen in the Multidisciplinary Breast Clinic.   STAGE:  Breast cancer, right breast-T1Nx  Primary site: Breast (Right)  Staging method: AJCC 7th Edition  Clinical: Stage IA (T1c, N0, cM0)  Summary: Stage IA (T1c, N0, cM0)  BREAST CANCER HISTORY: #1 Patient had a screening mammogram performed 07/17/2012. She was found to have a possible mass and calcifications in the right breast. She went on to have Spot magnification images performed that showed 8 mm ill defined spiculated mass with a few associated microcalcifications over the outer mid right breast. There were also 2 mm group of microcalcifications located 7 mm anterior to the spiculated mass. There were several other groups of microcalcifications within the upper outer right breast which were stable. She went nontender have a ultrasound performed that showed a irregular hypoechoic mass with shadowing at the 9:00 position of the right breast 5 cm from the nipple measuring 7 x 7 x 7 mm. Adjacent group of indeterminate microcalcifications 7 mm anterior to this mass were also noted. Patient was recommended biopsy of the mass. This was performed on 07/31/2012. 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 Tumor was ER positive PR positive HER-2/neu negative with Ki-67 5%. She went on to have MRI of the bilateral breasts performed on 08/07/2012. The MRI revealed in the right breast 1.3 x 0.9 x 1.6 cm irregular enhancing spiculated mass in the middle third of the lateral aspect of the right breast with associated biopsy changes. At 12:00 in the anterior third of the right breast was another enhancing mass measuring 7 x  6 x 5 mm. Patient was recommended biopsy of the 12:00 mass  #2 patient is now status post bilateral mastectomies with right axillary sentinel lymph node biopsy and placement of implant. Surgery performed on 10/11/2012. Final pathology revealed a T1 N0 disease. Postoperatively she is doing well.  #3 we discussed antiestrogen therapy. Risks and benefits of this were discussed with the patient. A prescription for tamoxifen was given to the patient to take 20 mg daily.  INTERVAL HISTORY: Sherry Stafford 58 y.o. female returns for followup post bilateral mastectomies and reconstruction. Clinically she seems to be doing well without any problems she denies any fevers, chills, night, sweats, headaches, shortness of breath, chest pains or palpitations. She denies blood in her stool or urine and vaginal bleeding. She denies any swelling, redness or tenderness in her arms or legs. Her mastectomy site looks good and healing very well no evidence of infections. She states that she has had a clear vaginal discharge that has no odor a few times. This discharge is followed by vaginal dryness. She also says that she has had some mild hot flashes.   CURRENT TREATMENT: Curative intent tamoxifen 20 mg daily.   REVIEW OF SYSTEMS: All other 10 point review of systems negative except for those things mentioned above.  PAST MEDICAL HISTORY: Past Medical History  Diagnosis Date  . Hyperlipidemia   . Depression   . Anxiety   . GERD (gastroesophageal reflux disease)   . Breast cancer   . Hot flashes   . Hypertension     stress test 5 yrs. ago or > , states it was wnl, no need for  cardiac f/u, states her BP is managed by Dr. Wilson Singer  . Arthritis     C5-6- bone spurs- treated by Chiropratic , achiness in hips & legs     PAST SURGICAL HISTORY: Past Surgical History  Procedure Laterality Date  . Tubal ligation    . Breast surgery Left 2006    open biopsy (benign), R breast- stereotactic core biopsy- R breast- 07/2012  .  Appendectomy      as a teen   . Blepheroplasy      bilateral   . Mastectomy w/ sentinel node biopsy Right 10/11/2012    Procedure:  bilateral MASTECTOMY WITH right  SENTINEL LYMPH NODE BIOPSY;  Surgeon: Odis Hollingshead, MD;  Location: [facility];  Service: General;  Laterality: Right;  right nuclear medicine injection 10:45 dr Harlow Mares to follow with reconstruction time alloted   . Total mastectomy Left 10/11/2012    Procedure: TOTAL MASTECTOMY;  Surgeon: Odis Hollingshead, MD;  Location: [facility];  Service: General;  Laterality: Left;  . Breast reconstruction with placement of tissue expander and flex hd (acellular hydrated dermis) Bilateral 10/11/2012    Procedure: BREAST RECONSTRUCTION WITH PLACEMENT OF TISSUE EXPANDER AND FLEX HD (ACELLULAR HYDRATED DERMIS);  Surgeon: Crissie Reese, MD;  Location: [facility];  Service: Plastics;  Laterality: Bilateral;  . Breast reconstruction  04/09/13    FAMILY HISTORY Family History  Problem Relation Age of Onset  . Cancer Father     colon mets to lung and brain  . Colon cancer Father     GYNECOLOGIC HISTORY: Pt reminded to follow up with her annual gynecological appointment.   SOCIAL HISTORY:  History   Social History Narrative  . No narrative on file    ADVANCED DIRECTIVES:   HEALTH MAINTENANCE: History  Substance Use Topics  . Smoking status: Never Smoker   . Smokeless tobacco: Not on file  . Alcohol Use: 1.2 oz/week    2 Glasses of wine per week     Comment: <2 glasses of wine per week    Colonoscopy: 2 year ago. PAP: Bone density: Lipid panel:  No Known Allergies  Current Outpatient Prescriptions  Medication Sig Dispense Refill  . amLODipine (NORVASC) 5 MG tablet Take 5 mg by mouth daily with breakfast.       . buPROPion (WELLBUTRIN XL) 150 MG 24 hr tablet Take 150 mg by mouth daily with breakfast.       . CALCIUM PO Take 0.5 tablets by mouth daily.      . Cholecalciferol (VITAMIN D PO) Take by mouth.      . co-enzyme Q-10 30 MG  capsule Take 30 mg by mouth 3 (three) times daily.      . fluticasone (FLONASE) 50 MCG/ACT nasal spray Place 2 sprays into the nose daily as needed for allergies.       . furosemide (LASIX) 20 MG tablet       . LORazepam (ATIVAN) 0.5 MG tablet       . LOVAZA 1 G capsule       . methocarbamol (ROBAXIN) 500 MG tablet Take 1 tablet (500 mg total) by mouth every 6 (six) hours as needed.  40 tablet  1  . rosuvastatin (CRESTOR) 5 MG tablet Take 5 mg by mouth daily with breakfast.       . tamoxifen (NOLVADEX) 20 MG tablet TAKE 1 TABLET DAILY  90 tablet  2  . omeprazole (PRILOSEC) 40 MG capsule       . triamcinolone  cream (KENALOG) 0.1 %        No current facility-administered medications for this visit.    OBJECTIVE: Filed Vitals:   09/17/13 1342  BP: 133/90  Pulse: 79  Temp: 98.4 F (36.9 C)  Resp: 20   Body mass index is 29.63 kg/(m^2). ECOG FS:0 - Asymptomatic Ocular: Sclerae unicteric, pupils equal, round and reactive to light Ear-nose-throat: Oropharynx clear, dentition fair Lymphatic: No cervical or supraclavicular adenopathy Lungs no rales or rhonchi, good excursion bilaterally Heart regular rate and rhythm, no murmur appreciated Abd soft, nontender, positive bowel sounds MSK no focal spinal tenderness, no joint edema Neuro: non-focal, well-oriented, appropriate affect Breasts: patient s/p bilateral mastectomies and reconstruction. Implants appear symmetrical. Nodular thickening noted along right axillary scar. Pt states no change in thickening along scar. No suspicious nodules noted.   LAB RESULTS: CMP     Component Value Date/Time   NA 142 09/17/2013 1236   NA 142 10/04/2012 1034   K 4.0 09/17/2013 1236   K 3.9 10/04/2012 1034   CL 107 10/04/2012 1034   CO2 22 09/17/2013 1236   CO2 26 10/04/2012 1034   GLUCOSE 88 09/17/2013 1236   GLUCOSE 87 10/04/2012 1034   BUN 20.1 09/17/2013 1236   BUN 13 10/04/2012 1034   CREATININE 0.9 09/17/2013 1236   CREATININE 0.94 10/04/2012 1034    CALCIUM 9.7 09/17/2013 1236   CALCIUM 10.1 10/04/2012 1034   PROT 7.1 09/17/2013 1236   PROT 7.2 10/04/2012 1034   ALBUMIN 3.6 09/17/2013 1236   ALBUMIN 3.8 10/04/2012 1034   AST 15 09/17/2013 1236   AST 15 10/04/2012 1034   ALT 17 09/17/2013 1236   ALT 16 10/04/2012 1034   ALKPHOS 46 09/17/2013 1236   ALKPHOS 55 10/04/2012 1034   BILITOT <0.20 09/17/2013 1236   BILITOT 0.2* 10/04/2012 1034   GFRNONAA 66* 10/04/2012 1034   GFRAA 76* 10/04/2012 1034   I No results found for this basename: SPEP, UPEP,  kappa and lambda light chains   Lab Results  Component Value Date   WBC 6.1 09/17/2013   NEUTROABS 2.9 09/17/2013   HGB 14.6 09/17/2013   HCT 43.4 09/17/2013   MCV 91.9 09/17/2013   PLT 236 09/17/2013   No results found for this basename: LABCA2   No components found with this basename: LABCA125   No results found for this basename: INR,  in the last 168 hours Urinalysis No results found for this basename: colorurine, appearanceur, labspec, phurine, glucoseu, hgbur, bilirubinur, ketonesur, proteinur, urobilinogen, nitrite, leukocytesur   STUDIES: No results found.  ASSESSMENT/PLAN:The patient is a 58 y.o. female with history of breast cancer status post bilateral mastectomies with immediate reconstruction. Her tumor was ER positive. Postoperatively she is doing well. She is on tamoxifen 61m daily.   #1 We will continue tamoxifen 20 mg daily.   #2 We will see her back in 6 months time for follow-up and labs   #3 We discussed her labs in detail.  #4 She will begin taking natural vitamin E 400 units daily for hot flashes.  All questions were answered and she is in agreement with this plan. The patient knows to call the clinic with any problems, questions or concerns. We can certainly see the patient much sooner if necessary.   I spent 15 minutes counseling the patient face to face. The total time spent in the appointment was 20 minutes.   CEliezer Bottom NP 09/17/2013 2:22 PM

## 2013-09-17 NOTE — Telephone Encounter (Signed)
Spoke with patient and rescheduled her appointment to Sherry Stafford for 09/17/13 at 1230 labs and 1pm with Clarise Cruz.  Patient aware.

## 2013-09-17 NOTE — Telephone Encounter (Signed)
per pof to sch ptappt-sch appt & gave pt copy of sch

## 2013-09-17 NOTE — Patient Instructions (Signed)
Natural vitamin E 400 units daily for hot flashes.  Reconstructing Sherry Stafford by Hughie Closs

## 2013-09-17 NOTE — Telephone Encounter (Signed)
per Charlynne Cousins move pt to [facility] so she could note the acct-do a pof-moved pt

## 2013-09-24 ENCOUNTER — Other Ambulatory Visit: Payer: BC Managed Care – PPO

## 2013-09-24 ENCOUNTER — Ambulatory Visit: Payer: BC Managed Care – PPO | Admitting: Oncology

## 2013-10-14 ENCOUNTER — Ambulatory Visit
Admission: RE | Admit: 2013-10-14 | Discharge: 2013-10-14 | Disposition: A | Discharge status: Home / Self Care | Payer: BC Managed Care – PPO | Source: Ambulatory Visit | Attending: Obstetrics and Gynecology | Admitting: Obstetrics and Gynecology

## 2013-10-14 ENCOUNTER — Encounter (INDEPENDENT_AMBULATORY_CARE_PROVIDER_SITE_OTHER): Payer: Self-pay

## 2013-10-14 DIAGNOSIS — M949 Disorder of cartilage, unspecified: Secondary | ICD-10-CM

## 2013-10-14 DIAGNOSIS — M899 Disorder of bone, unspecified: Secondary | ICD-10-CM

## 2013-10-14 DIAGNOSIS — N951 Menopausal and female climacteric states: Secondary | ICD-10-CM

## 2013-10-14 DIAGNOSIS — C50919 Malignant neoplasm of unspecified site of unspecified female breast: Secondary | ICD-10-CM

## 2013-10-31 ENCOUNTER — Other Ambulatory Visit: Payer: BC Managed Care – PPO

## 2013-10-31 ENCOUNTER — Ambulatory Visit: Payer: BC Managed Care – PPO | Admitting: Oncology

## 2013-11-18 ENCOUNTER — Encounter (INDEPENDENT_AMBULATORY_CARE_PROVIDER_SITE_OTHER): Payer: Self-pay | Admitting: General Surgery

## 2013-11-18 ENCOUNTER — Ambulatory Visit (INDEPENDENT_AMBULATORY_CARE_PROVIDER_SITE_OTHER): Payer: BC Managed Care – PPO | Admitting: General Surgery

## 2013-11-18 VITALS — BP 130/76 | HR 72 | Ht 67.0 in | Wt 174.0 lb

## 2013-11-18 DIAGNOSIS — R2230 Localized swelling, mass and lump, unspecified upper limb: Secondary | ICD-10-CM | POA: Insufficient documentation

## 2013-11-18 DIAGNOSIS — R2231 Localized swelling, mass and lump, right upper limb: Secondary | ICD-10-CM

## 2013-11-18 DIAGNOSIS — R229 Localized swelling, mass and lump, unspecified: Secondary | ICD-10-CM

## 2013-11-18 NOTE — Patient Instructions (Signed)
The office will call you to schedule your surgery.

## 2013-11-18 NOTE — Progress Notes (Signed)
Procedure:  Bilateral mastectomies with right axillary sentinel lymph node biopsy and placement of implants.  Date:  10/11/2012  Pathology:  T1N0  History: She is here for aother long-term followup visit of her right breast cancer. She has bilateral breast reconstruction and still has some intermittent discomfort from this. She found a small nodule in her right axilla in the scar area. Exam: General- Is in NAD. Chest-right chest wall scar is clean and intact.  No palpable nodules.  Lymph nodes-1 cm palpable subcutaneous mass in the right axillary scar Assessment: Invasive right breast cancer status post bilateral mastectomies with right axillary sentinel lymph node biopsy and reconstruction-has a new nodule in right axilla.  Plan:  Remove of right axillary mass.  The procedure and risks were discussed with her.  Risks include but are not limited to bleeding, infection, wound healing problem.

## 2013-12-17 ENCOUNTER — Other Ambulatory Visit (INDEPENDENT_AMBULATORY_CARE_PROVIDER_SITE_OTHER): Payer: Self-pay | Admitting: General Surgery

## 2013-12-19 ENCOUNTER — Ambulatory Visit (INDEPENDENT_AMBULATORY_CARE_PROVIDER_SITE_OTHER): Payer: BC Managed Care – PPO | Admitting: General Surgery

## 2014-01-13 ENCOUNTER — Other Ambulatory Visit: Payer: Self-pay | Admitting: *Deleted

## 2014-01-13 NOTE — Progress Notes (Signed)
DISPENSING ORDER         @TODAYDATE @10 /16/15  Patient Name Sherry Stafford DOB [birth year]   Dx:   Mastectomy x Partial Mastectomy  For Continued Medical Need   Dx Code: C58.850 (Unspecified)  x C50.911 (Rt)  C50.912 (Lt)    Z90.10 (Unspecified)   Z90.11 (Rt)  Z90.12 (Lt)   Z90.13 (Bilateral) x    L8015 Post-Mastectomy Camisole - to hold drain tubes and wear during healing.   Qty 2 Length of Use 3 Months No Refills   x L8000 Post-Surgical Bras - to hold prosthesis.     Qty 6 Length of Use 3 Months N3 Refills   x [id number] Non-Silicone Breast Prosthesis - for temporary use during healing or for      comfort at and of day, lighter weight, cooler for hot weather.    Qty 1 for Single, 2 for Bilateral Length of Use 6 Months Up to 1 Refill    1 to wear 1 to wash      O8786 Silicone Breast Prosthesis - to restore balance and symmetry after breast     surgery.   Qty 1 for Single, 2 for Bilateral Length of Use 2 Years No Refill    L803 Nipple Prosthesis - to restore symmetry of the breast.   Qty 1 for Single, 2 for Bilateral Length of Use 2 Years No Refill   Continued use of above listed mastectomy products is medically necessary.  Provider Signature  Date @TODAYDATE @  Replacement needed due to significant weight lossDISPENSING ORDER         @TODAYDATE @10 /16/15  Patient Name Sherry Stafford DOB [birth year]   Dx:   Mastectomy x Partial Mastectomy  For Continued Medical Need   Dx Code: V67.209 (Unspecified)  x C50.911 (Rt)  C50.912 (Lt)    Z90.10 (Unspecified)   Z90.11 (Rt)  Z90.12 (Lt)   Z90.13 (Bilateral) x    L8015 Post-Mastectomy Camisole - to hold drain tubes and wear during healing.   Qty 2 Length of Use 3 Months No Refills   x L8000 Post-Surgical Bras - to hold prosthesis.     Qty 6 Length of Use 3 Months N3 Refills   x O7096 Non-Silicone Breast Prosthesis - for temporary use during healing or for      comfort at and of day, lighter weight, cooler for hot weather.    Qty  1 for Single, 2 for Bilateral Length of Use 6 Months Up to 1 Refill    1 to wear 1 to wash      [id number] Silicone Breast Prosthesis - to restore balance and symmetry after breast     surgery.   Qty 1 for Single, 2 for Bilateral Length of Use 2 Years No Refill    L803 Nipple Prosthesis - to restore symmetry of the breast.   Qty 1 for Single, 2 for Bilateral Length of Use 2 Years No Refill   Continued use of above listed mastectomy products is medically necessary.  Provider Signature  Date @TODAYDATE @   Replacement needed due to significant weight loss  NPI        NPI

## 2014-01-13 NOTE — Progress Notes (Signed)
DISPENSING ORDER         @TODAYDATE @10 /16/15  Patient Name Sherry Stafford DOB Jan 23, 1956   Dx:   Mastectomy x Partial Mastectomy  For Continued Medical Need   Dx Code: A30.940 (Unspecified)  x C50.911 (Rt)  C50.912 (Lt)    Z90.10 (Unspecified)   Z90.11 (Rt)  Z90.12 (Lt)   Z90.13 (Bilateral) x    L8015 Post-Mastectomy Camisole - to hold drain tubes and wear during healing.   Qty 2 Length of Use 3 Months No Refills   x L8000 Post-Surgical Bras - to hold prosthesis.     Qty 6 Length of Use 3 Months N3 Refills   x H6808 Non-Silicone Breast Prosthesis - for temporary use during healing or for      comfort at and of day, lighter weight, cooler for hot weather.    Qty 1 for Single, 2 for Bilateral Length of Use 6 Months Up to 1 Refill    1 to wear 1 to wash      U1103 Silicone Breast Prosthesis - to restore balance and symmetry after breast     surgery.   Qty 1 for Single, 2 for Bilateral Length of Use 2 Years No Refill    L803 Nipple Prosthesis - to restore symmetry of the breast.   Qty 1 for Single, 2 for Bilateral Length of Use 2 Years No Refill   Continued use of above listed mastectomy products is medically necessary.  Provider Signature  Date @TODAYDATE @   Replacement needed due to significant weight loss  NPI

## 2014-01-14 ENCOUNTER — Other Ambulatory Visit: Payer: Self-pay | Admitting: *Deleted

## 2014-01-14 ENCOUNTER — Encounter: Payer: Self-pay | Admitting: *Deleted

## 2014-03-25 ENCOUNTER — Ambulatory Visit (HOSPITAL_BASED_OUTPATIENT_CLINIC_OR_DEPARTMENT_OTHER): Payer: BC Managed Care – PPO | Admitting: Hematology and Oncology

## 2014-03-25 ENCOUNTER — Telehealth: Payer: Self-pay | Admitting: Hematology and Oncology

## 2014-03-25 ENCOUNTER — Other Ambulatory Visit (HOSPITAL_BASED_OUTPATIENT_CLINIC_OR_DEPARTMENT_OTHER): Payer: BC Managed Care – PPO

## 2014-03-25 VITALS — BP 115/75 | HR 82 | Temp 97.6°F | Resp 20 | Ht 67.0 in | Wt 168.6 lb

## 2014-03-25 DIAGNOSIS — C50811 Malignant neoplasm of overlapping sites of right female breast: Secondary | ICD-10-CM

## 2014-03-25 DIAGNOSIS — C50911 Malignant neoplasm of unspecified site of right female breast: Secondary | ICD-10-CM

## 2014-03-25 DIAGNOSIS — Z170 Estrogen receptor positive status [ER+]: Secondary | ICD-10-CM

## 2014-03-25 LAB — CBC WITH DIFFERENTIAL/PLATELET
BASO%: 1.1 % (ref 0.0–2.0)
Basophils Absolute: 0 10*3/uL (ref 0.0–0.1)
EOS%: 1.9 % (ref 0.0–7.0)
Eosinophils Absolute: 0.1 10*3/uL (ref 0.0–0.5)
HCT: 42 % (ref 34.8–46.6)
HGB: 13.6 g/dL (ref 11.6–15.9)
LYMPH%: 35.4 % (ref 14.0–49.7)
MCH: 30 pg (ref 25.1–34.0)
MCHC: 32.4 g/dL (ref 31.5–36.0)
MCV: 92.6 fL (ref 79.5–101.0)
MONO#: 0.4 10*3/uL (ref 0.1–0.9)
MONO%: 9.6 % (ref 0.0–14.0)
NEUT#: 2.2 10*3/uL (ref 1.5–6.5)
NEUT%: 52 % (ref 38.4–76.8)
Platelets: 221 10*3/uL (ref 145–400)
RBC: 4.54 10*6/uL (ref 3.70–5.45)
RDW: 12.8 % (ref 11.2–14.5)
WBC: 4.2 10*3/uL (ref 3.9–10.3)
lymph#: 1.5 10*3/uL (ref 0.9–3.3)

## 2014-03-25 LAB — COMPREHENSIVE METABOLIC PANEL (CC13)
ALT: 13 U/L (ref 0–55)
AST: 15 U/L (ref 5–34)
Albumin: 3.6 g/dL (ref 3.5–5.0)
Alkaline Phosphatase: 39 U/L — ABNORMAL LOW (ref 40–150)
Anion Gap: 7 mEq/L (ref 3–11)
BUN: 16.5 mg/dL (ref 7.0–26.0)
CO2: 26 mEq/L (ref 22–29)
Calcium: 9.5 mg/dL (ref 8.4–10.4)
Chloride: 110 mEq/L — ABNORMAL HIGH (ref 98–109)
Creatinine: 1 mg/dL (ref 0.6–1.1)
EGFR: 60 mL/min/{1.73_m2} — ABNORMAL LOW (ref >90)
Glucose: 84 mg/dl (ref 70–140)
Potassium: 4.1 mEq/L (ref 3.5–5.1)
Sodium: 143 mEq/L (ref 136–145)
Total Bilirubin: 0.22 mg/dL (ref 0.20–1.20)
Total Protein: 6.6 g/dL (ref 6.4–8.3)

## 2014-03-25 NOTE — Progress Notes (Signed)
Patient Care Team: Anda Kraft, MD as PCP - General (Endocrinology)  DIAGNOSIS: Breast cancer, right breast-T1Nx s/p bilateral mastectomies and right axillary SLNBx with reconstruction 10/11/12   Staging form: Breast, AJCC 7th Edition     Clinical: Stage IA (T4, N3, cM0) - Unsigned       Staging comments: Staged at breast conference 5.14.14      Pathologic: Stage IA (T1c, N0, cM0) - Signed by Seward Grater, MD on 10/29/2012   SUMMARY OF ONCOLOGIC HISTORY:   Breast cancer, right breast-T1Nx s/p bilateral mastectomies and right axillary SLNBx with reconstruction 10/11/12   07/31/2012 Initial Diagnosis Right breast biopsy: 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 Tumor was ER positive PR positive HER-2/neu negative with Ki-67 5%   08/07/2012 Breast MRI right breast 1.3 x 0.9 x 1.6 cm irregular enhancing spiculated mass in the middle third of the lateral aspect of the right breast . At 12:00 in the anterior third of the right breast was another enhancing mass measuring 7 x 6 x 5 mm   10/11/2012 Surgery Bilateral mastectomies: T1 N0 M0 stage IA right breast cancer   11/23/2012 -  Anti-estrogen oral therapy Tamoxifen 20 mg daily    CHIEF COMPLIANT: Follow-up on tamoxifen therapy  INTERVAL HISTORY: Sherry Stafford is a 58 year old lady with above-mentioned history of right-sided breast cancer treated with bilateral mastectomies. She is currently on tamoxifen 20 mg daily that started in August 2014. She has been tolerating it extremely well without any major problems or concerns. Denies any hot flashes or muscle aches or pains. She complains of vaginal discharge and she had seen a gynecologist. She gets Pap smears every year. She recently bone density scan which was normal. She had an epidermal inclusion cyst removed from the right axilla September 2015.  REVIEW OF SYSTEMS:   Constitutional: Denies fevers, chills or abnormal weight loss Eyes: Denies blurriness of vision Ears, nose, mouth,  throat, and face: Denies mucositis or sore throat Respiratory: Denies cough, dyspnea or wheezes Cardiovascular: Denies palpitation, chest discomfort or lower extremity swelling Gastrointestinal:  Denies nausea, heartburn or change in bowel habits Skin: Denies abnormal skin rashes Lymphatics: Denies new lymphadenopathy or easy bruising Neurological:Denies numbness, tingling or new weaknesses Behavioral/Psych: Mood is stable, no new changes  Breast:  denies any pain or lumps or nodules in either breasts All other systems were reviewed with the patient and are negative.  I have reviewed the past medical history, past surgical history, social history and family history with the patient and they are unchanged from previous note.  ALLERGIES:  has No Known Allergies.  MEDICATIONS:  Current Outpatient Prescriptions  Medication Sig Dispense Refill  . amLODipine (NORVASC) 5 MG tablet Take 5 mg by mouth daily with breakfast.     . buPROPion (WELLBUTRIN XL) 150 MG 24 hr tablet Take 150 mg by mouth daily with breakfast.     . CALCIUM PO Take 0.5 tablets by mouth daily.    . Cholecalciferol (VITAMIN D PO) Take by mouth.    . clindamycin-benzoyl peroxide (BENZACLIN) gel   2  . co-enzyme Q-10 30 MG capsule Take 30 mg by mouth 3 (three) times daily.    . fluticasone (FLONASE) 50 MCG/ACT nasal spray Place 2 sprays into the nose daily as needed for allergies.     . furosemide (LASIX) 20 MG tablet     . LORazepam (ATIVAN) 0.5 MG tablet     . LOVAZA 1 G capsule     .  methocarbamol (ROBAXIN) 500 MG tablet Take 1 tablet (500 mg total) by mouth every 6 (six) hours as needed. 40 tablet 1  . omeprazole (PRILOSEC) 40 MG capsule     . rosuvastatin (CRESTOR) 5 MG tablet Take 5 mg by mouth daily with breakfast.     . tamoxifen (NOLVADEX) 20 MG tablet TAKE 1 TABLET DAILY 90 tablet 2  . triamcinolone cream (KENALOG) 0.1 %      No current facility-administered medications for this visit.    PHYSICAL  EXAMINATION: ECOG PERFORMANCE STATUS: 0 - Asymptomatic  Filed Vitals:   03/25/14 1107  BP: 115/75  Pulse: 82  Temp: 97.6 F (36.4 C)  Resp: 20   Filed Weights   03/25/14 1107  Weight: 168 lb 9.6 oz (76.476 kg)    GENERAL:alert, no distress and comfortable SKIN: skin color, texture, turgor are normal, no rashes or significant lesions EYES: normal, Conjunctiva are pink and non-injected, sclera clear OROPHARYNX:no exudate, no erythema and lips, buccal mucosa, and tongue normal  NECK: supple, thyroid normal size, non-tender, without nodularity LYMPH:  no palpable lymphadenopathy in the cervical, axillary or inguinal LUNGS: clear to auscultation and percussion with normal breathing effort HEART: regular rate & rhythm and no murmurs and no lower extremity edema ABDOMEN:abdomen soft, non-tender and normal bowel sounds Musculoskeletal:no cyanosis of digits and no clubbing  NEURO: alert & oriented x 3 with fluent speech, no focal motor/sensory deficits BREAST: No palpable masses or nodules in either right or left breasts. Right breast reconstruction appears to be doing very well. No palpable axillary supraclavicular or infraclavicular adenopathy   LABORATORY DATA:  I have reviewed the data as listed   Chemistry      Component Value Date/Time   NA 143 03/25/2014 1033   NA 142 10/04/2012 1034   K 4.1 03/25/2014 1033   K 3.9 10/04/2012 1034   CL 107 10/04/2012 1034   CO2 26 03/25/2014 1033   CO2 26 10/04/2012 1034   BUN 16.5 03/25/2014 1033   BUN 13 10/04/2012 1034   CREATININE 1.0 03/25/2014 1033   CREATININE 0.94 10/04/2012 1034      Component Value Date/Time   CALCIUM 9.5 03/25/2014 1033   CALCIUM 10.1 10/04/2012 1034   ALKPHOS 39* 03/25/2014 1033   ALKPHOS 55 10/04/2012 1034   AST 15 03/25/2014 1033   AST 15 10/04/2012 1034   ALT 13 03/25/2014 1033   ALT 16 10/04/2012 1034   BILITOT 0.22 03/25/2014 1033   BILITOT 0.2* 10/04/2012 1034       Lab Results  Component  Value Date   WBC 4.2 03/25/2014   HGB 13.6 03/25/2014   HCT 42.0 03/25/2014   MCV 92.6 03/25/2014   PLT 221 03/25/2014   NEUTROABS 2.2 03/25/2014    ASSESSMENT & PLAN:  Breast cancer, right breast-T1Nx s/p bilateral mastectomies and right axillary SLNBx with reconstruction 10/11/12 Right breast 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  Tamoxifen toxicities: Denies any hot flashes or muscle aches or pains. Breast cancer surveillance: Today's breakfast and axillary exam was normal. Since she had bilateral mastectomies there is no role of surveillance imaging studies.  Return to clinic in 6 months for follow-up. I discussed with her that there is no need to do blood work every 6 months. We will do annual blood work only.   No orders of the  defined types were placed in this encounter.   The patient has a good understanding of the overall plan. she agrees with it. She will call with any problems that may develop before her next visit here.   Rulon Eisenmenger, MD 03/25/2014 11:34 AM

## 2014-03-25 NOTE — Telephone Encounter (Signed)
, °

## 2014-03-25 NOTE — Assessment & Plan Note (Signed)
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  Tamoxifen toxicities: Denies any hot flashes or muscle aches or pains. Breast cancer surveillance: Today's breakfast and axillary exam was normal. Since she had bilateral mastectomies there is no role of surveillance imaging studies.  Return to clinic in 6 months for follow-up. I discussed with her that there is no need to do blood work every 6 months. We will do annual blood work only.

## 2014-04-21 ENCOUNTER — Other Ambulatory Visit: Payer: Self-pay | Admitting: *Deleted

## 2014-04-21 MED ORDER — TAMOXIFEN CITRATE 20 MG PO TABS: 20.0000 mg | ORAL_TABLET | Freq: Every day | ORAL | Status: DC

## 2014-07-07 ENCOUNTER — Encounter: Payer: Self-pay | Admitting: General Surgery

## 2014-07-07 NOTE — Progress Notes (Signed)
Patient ID: Sherry Stafford, female   DOB: [birth year], 59 y.o.   MRN: [id number] Sherry Stafford 07/07/2014 10:01 AM Location: [facility] Patient #: [id number] DOB: 11-28-1955 Married / Language: Sherry Stafford / Race: White Female History of Present Illness Sherry Hollingshead MD; 07/07/2014 11:06 AM) Patient words: reck.  The patient is a 59 year old female    Note:Procedure: Bilateral mastectomies with right axillary sentinel lymph node biopsy and placement of implants.  Date: 10/11/2012  Pathology: T1N0  History: She is here for aother long-term followup visit of her right breast cancer. She has bilateral breast reconstruction and still has some intermittent discomfort from this. She denies any new nodules in the chest wall. She is taking Tamoxifen.  Exam: General- Is in NAD. Chest-bilateral chest wall scars are clean and intact. No palpable nodules.  Lymph nodes-no palpable cervical, supraclavicular, or axillary adenopathy  Allergies (Sherry Stafford, CMA; 07/07/2014 10:25 AM) No Known Drug Allergies 01/02/2014  Medication History (Sherry Stafford, CMA; 07/07/2014 10:26 AM) LORazepam (0.5MG  Tablet, Oral) Active. AmLODIPine Besylate (5MG  Tablet, Oral) Active. BuPROPion HCl ER (XL) (150MG  Tablet ER 24HR, Oral) Active. Crestor (5MG  Tablet, Oral) Active. Omega-3-acid Ethyl Esters (1GM Capsule, Oral) Active. Tamoxifen Citrate (20MG  Tablet, Oral) Active. Medications Reconciled    Vitals (Sherry Stafford CMA; 07/07/2014 10:25 AM) 07/07/2014 10:24 AM Weight: 171 lb Height: 67in Body Surface Area: 1.91 m Body Mass Index: 26.78 kg/m Temp.: 97.77F(Temporal)  Pulse: 75 (Regular)  BP: 124/80 (Sitting, Left Arm, Standard)     Assessment & Plan Sherry Hollingshead MD; 07/07/2014 11:07 AM)  HISTORY OF RIGHT BREAST CANCER (V10.3  Z85.3) Impression: Assessment: Invasive right breast cancer status post bilateral mastectomies with right axillary sentinel lymph node  biopsy and reconstruction-No clinical evidence or recurrence. Lesion in the right axillary area was benign.  Plan: Return visit 6 months.  Sherry Confer, MD

## 2014-09-24 ENCOUNTER — Other Ambulatory Visit: Payer: Self-pay

## 2014-09-24 ENCOUNTER — Telehealth: Payer: Self-pay | Admitting: Hematology and Oncology

## 2014-09-24 NOTE — Telephone Encounter (Signed)
Called patient per pof and she is aware of her new appointment

## 2014-09-25 ENCOUNTER — Ambulatory Visit: Payer: BC Managed Care – PPO | Admitting: Hematology and Oncology

## 2014-10-20 ENCOUNTER — Ambulatory Visit (HOSPITAL_BASED_OUTPATIENT_CLINIC_OR_DEPARTMENT_OTHER): Payer: BLUE CROSS/BLUE SHIELD | Admitting: Hematology and Oncology

## 2014-10-20 ENCOUNTER — Telehealth: Payer: Self-pay | Admitting: Hematology and Oncology

## 2014-10-20 ENCOUNTER — Ambulatory Visit (HOSPITAL_COMMUNITY)
Admission: RE | Admit: 2014-10-20 | Discharge: 2014-10-20 | Disposition: A | Discharge status: Home / Self Care | Payer: BLUE CROSS/BLUE SHIELD | Source: Ambulatory Visit | Attending: Hematology and Oncology | Admitting: Hematology and Oncology

## 2014-10-20 ENCOUNTER — Encounter: Payer: Self-pay | Admitting: Hematology and Oncology

## 2014-10-20 VITALS — BP 129/86 | HR 81 | Temp 98.5°F | Resp 18 | Ht 67.0 in | Wt 176.2 lb

## 2014-10-20 DIAGNOSIS — Z170 Estrogen receptor positive status [ER+]: Secondary | ICD-10-CM | POA: Diagnosis not present

## 2014-10-20 DIAGNOSIS — C50811 Malignant neoplasm of overlapping sites of right female breast: Secondary | ICD-10-CM

## 2014-10-20 DIAGNOSIS — I8290 Acute embolism and thrombosis of unspecified vein: Secondary | ICD-10-CM

## 2014-10-20 DIAGNOSIS — Z79810 Long term (current) use of selective estrogen receptor modulators (SERMs): Secondary | ICD-10-CM | POA: Diagnosis not present

## 2014-10-20 DIAGNOSIS — R2241 Localized swelling, mass and lump, right lower limb: Secondary | ICD-10-CM | POA: Diagnosis present

## 2014-10-20 DIAGNOSIS — C50911 Malignant neoplasm of unspecified site of right female breast: Secondary | ICD-10-CM

## 2014-10-20 NOTE — Assessment & Plan Note (Signed)
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  Tamoxifen toxicities: Denies any hot flashes or muscle aches or pains  Breast Cancer Surveillance: 1. Breast exam 10/20/2014: Normal 2. Mammogram: Since she had bilateral mastectomies there is no role of mammograms.  Survivorship: Discussed the importance of physical exercise in decreasing the likelihood of breast cancer recurrence. Recommended 30 mins daily 6 days a week of either brisk walking or cycling or swimming. Encouraged patient to eat more fruits and vegetables and decrease red meat.   Return to clinic once a year for follow-up

## 2014-10-20 NOTE — Progress Notes (Signed)
Patient Care Team: Anda Kraft, MD as PCP - General (Endocrinology)  DIAGNOSIS: Breast cancer, right breast-T1Nx s/p bilateral mastectomies and right axillary SLNBx with reconstruction 10/11/12   Staging form: Breast, AJCC 7th Edition     Clinical: Stage IA (T4, N3, cM0) - Unsigned       Staging comments: Staged at breast conference 5.14.14      Pathologic: Stage IA (T1c, N0, cM0) - Signed by Seward Grater, MD on 10/29/2012   SUMMARY OF ONCOLOGIC HISTORY:   Breast cancer, right breast-T1Nx s/p bilateral mastectomies and right axillary SLNBx with reconstruction 10/11/12   07/31/2012 Initial Diagnosis Right breast biopsy: 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 Tumor was ER positive PR positive HER-2/neu negative with Ki-67 5%   08/07/2012 Breast MRI right breast 1.3 x 0.9 x 1.6 cm irregular enhancing spiculated mass in the middle third of the lateral aspect of the right breast . At 12:00 in the anterior third of the right breast was another enhancing mass measuring 7 x 6 x 5 mm   10/11/2012 Surgery Bilateral mastectomies: T1 N0 M0 stage IA right breast cancer   11/23/2012 -  Anti-estrogen oral therapy Tamoxifen 20 mg daily    CHIEF COMPLIANT: Complains of aches in the lower back radiating down the leg as well as in the neck radiating down the arm as well as right leg swelling  INTERVAL HISTORY: Sherry Stafford is a 59-year-old with above-mentioned history of right breast cancer treated with bilateral mastectomies and is currently on tamoxifen. She is tolerating it moderately well. She does have multiple aches and pains in the lower back as well as upper back that radiated down the arms and legs. She is complaining of vaginal discharge is clear and she had a pelvic exam which was normal. She complains of weak nails and change in texture of the nails as well as some hair loss. She also complains of right leg being swollen intermittently.  REVIEW OF SYSTEMS:   Constitutional: Denies fevers,  chills or abnormal weight loss Eyes: Denies blurriness of vision Ears, nose, mouth, throat, and face: Denies mucositis or sore throat Respiratory: Denies cough, dyspnea or wheezes Cardiovascular: Denies palpitation, chest discomfort or lower extremity swelling Gastrointestinal:  Denies nausea, heartburn or change in bowel habits Skin: Nails becoming brittle Lymphatics: Denies new lymphadenopathy or easy bruising Neurological:Denies numbness, tingling or new weaknesses Behavioral/Psych: Mood is stable, no new changes  All other systems were reviewed with the patient and are negative.  I have reviewed the past medical history, past surgical history, social history and family history with the patient and they are unchanged from previous note.  ALLERGIES:  has No Known Allergies.  MEDICATIONS:  Current Outpatient Prescriptions  Medication Sig Dispense Refill  . amLODipine (NORVASC) 5 MG tablet Take 5 mg by mouth daily with breakfast.     . buPROPion (WELLBUTRIN XL) 150 MG 24 hr tablet Take 150 mg by mouth daily with breakfast.     . CALCIUM PO Take 0.5 tablets by mouth daily.    . Cholecalciferol (VITAMIN D PO) Take by mouth.    . clindamycin-benzoyl peroxide (BENZACLIN) gel   2  . co-enzyme Q-10 30 MG capsule Take 30 mg by mouth 3 (three) times daily.    . fluticasone (FLONASE) 50 MCG/ACT nasal spray Place 2 sprays into the nose daily as needed for allergies.     . furosemide (LASIX) 20 MG tablet     . LORazepam (ATIVAN) 0.5 MG tablet     .  LOVAZA 1 G capsule     . methocarbamol (ROBAXIN) 500 MG tablet Take 1 tablet (500 mg total) by mouth every 6 (six) hours as needed. 40 tablet 1  . omeprazole (PRILOSEC) 40 MG capsule     . rosuvastatin (CRESTOR) 5 MG tablet Take 5 mg by mouth daily with breakfast.     . tamoxifen (NOLVADEX) 20 MG tablet Take 1 tablet (20 mg total) by mouth daily. 90 tablet 2  . triamcinolone cream (KENALOG) 0.1 %      No current facility-administered medications  for this visit.    PHYSICAL EXAMINATION: ECOG PERFORMANCE STATUS: 1 - Symptomatic but completely ambulatory  Filed Vitals:   10/20/14 1114  BP: 129/86  Pulse: 81  Temp: 98.5 F (36.9 C)  Resp: 18   Filed Weights   10/20/14 1114  Weight: 176 lb 3.2 oz (79.924 kg)    GENERAL:alert, no distress and comfortable SKIN: skin color, texture, turgor are normal, no rashes or significant lesions EYES: normal, Conjunctiva are pink and non-injected, sclera clear OROPHARYNX:no exudate, no erythema and lips, buccal mucosa, and tongue normal  NECK: supple, thyroid normal size, non-tender, without nodularity LYMPH:  no palpable lymphadenopathy in the cervical, axillary or inguinal LUNGS: clear to auscultation and percussion with normal breathing effort HEART: regular rate & rhythm and no murmurs and no lower extremity edema ABDOMEN:abdomen soft, non-tender and normal bowel sounds Musculoskeletal:no cyanosis of digits and no clubbing  NEURO: alert & oriented x 3 with fluent speech, no focal motor/sensory deficits BREAST: Chest wall and axilla no palpable nodules. (exam performed in the presence of a chaperone)  LABORATORY DATA:  I have reviewed the data as listed   Chemistry      Component Value Date/Time   NA 143 03/25/2014 1033   NA 142 10/04/2012 1034   K 4.1 03/25/2014 1033   K 3.9 10/04/2012 1034   CL 107 10/04/2012 1034   CO2 26 03/25/2014 1033   CO2 26 10/04/2012 1034   BUN 16.5 03/25/2014 1033   BUN 13 10/04/2012 1034   CREATININE 1.0 03/25/2014 1033   CREATININE 0.94 10/04/2012 1034      Component Value Date/Time   CALCIUM 9.5 03/25/2014 1033   CALCIUM 10.1 10/04/2012 1034   ALKPHOS 39* 03/25/2014 1033   ALKPHOS 55 10/04/2012 1034   AST 15 03/25/2014 1033   AST 15 10/04/2012 1034   ALT 13 03/25/2014 1033   ALT 16 10/04/2012 1034   BILITOT 0.22 03/25/2014 1033   BILITOT 0.2* 10/04/2012 1034       Lab Results  Component Value Date   WBC 4.2 03/25/2014   HGB  13.6 03/25/2014   HCT 42.0 03/25/2014   MCV 92.6 03/25/2014   PLT 221 03/25/2014   NEUTROABS 2.2 03/25/2014    ASSESSMENT & PLAN:  Breast cancer, right breast-T1Nx s/p bilateral mastectomies and right axillary SLNBx with reconstruction 10/11/12 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  Tamoxifen toxicities: 1. Musculoskeletal aches and pains 2. Right leg swelling: We will obtain an ultrasound of the leg today 10/20/2014 3. Intermittent hot flashes  Breast Cancer Surveillance: 1. Breast exam 10/20/2014: Normal 2. Mammogram: Since she had bilateral mastectomies there is no role of mammograms.  Survivorship: Discussed the importance of physical exercise in decreasing the likelihood of breast cancer recurrence. Recommended 30 mins daily 6  days a week of either brisk walking or cycling or swimming. Encouraged patient to eat more fruits and vegetables and decrease red meat.   Return to clinic once a year for follow-up    No orders of the defined types were placed in this encounter.   The patient has a good understanding of the overall plan. she agrees with it. she will call with any problems that may develop before the next visit here.   Rulon Eisenmenger, MD

## 2014-10-20 NOTE — Telephone Encounter (Signed)
Appointments made and avs pritned for patient °

## 2014-10-20 NOTE — Progress Notes (Addendum)
*  Preliminary Results* Right lower extremity venous duplex completed. Right lower extremity is negative for deep vein thrombosis. There is no evidence of right Baker's cyst.  Preliminary results discussed with [name] of Dr. Geralyn Flash office.  10/20/2014 1:41 PM  [name], RVT, RDCS, RDMS

## 2014-10-21 ENCOUNTER — Telehealth: Payer: Self-pay

## 2014-10-21 NOTE — Telephone Encounter (Signed)
Let pt know doppler was negative.  Pt voiced understanding.

## 2015-01-01 ENCOUNTER — Other Ambulatory Visit: Payer: Self-pay | Admitting: General Surgery

## 2015-01-01 ENCOUNTER — Other Ambulatory Visit: Payer: Self-pay

## 2015-01-01 DIAGNOSIS — C50911 Malignant neoplasm of unspecified site of right female breast: Secondary | ICD-10-CM

## 2015-01-01 NOTE — Addendum Note (Signed)
Addended by: Odis Hollingshead on: 01/01/2015 11:45 AM   Modules accepted: Orders

## 2015-01-02 ENCOUNTER — Telehealth: Payer: Self-pay | Admitting: Hematology and Oncology

## 2015-01-02 ENCOUNTER — Other Ambulatory Visit: Payer: Self-pay | Admitting: *Deleted

## 2015-01-02 DIAGNOSIS — C50911 Malignant neoplasm of unspecified site of right female breast: Secondary | ICD-10-CM

## 2015-01-02 NOTE — Telephone Encounter (Signed)
Called patient and she stated that dr Zella Richer talked with her about survivorship but she is not interested.  She will keep appointment with dr Lindi Adie 03/2015

## 2015-01-02 NOTE — Progress Notes (Signed)
Fax received from Dr. Zella Richer with request to place referral to survivorship program. Referral placed and POF completed.

## 2015-01-12 ENCOUNTER — Other Ambulatory Visit: Payer: Self-pay | Admitting: Orthopaedic Surgery

## 2015-01-12 DIAGNOSIS — M545 Low back pain: Secondary | ICD-10-CM

## 2015-01-16 ENCOUNTER — Ambulatory Visit
Admission: RE | Admit: 2015-01-16 | Discharge: 2015-01-16 | Disposition: A | Discharge status: Home / Self Care | Payer: BLUE CROSS/BLUE SHIELD | Source: Ambulatory Visit | Attending: Orthopaedic Surgery | Admitting: Orthopaedic Surgery

## 2015-01-16 DIAGNOSIS — M545 Low back pain: Secondary | ICD-10-CM

## 2015-01-26 ENCOUNTER — Encounter: Payer: Self-pay | Admitting: Adult Health

## 2015-01-26 NOTE — Progress Notes (Signed)
A birthday card was mailed to the patient today on behalf of the Survivorship Program at [facility].   Farhan Jean, NP Survivorship Program [facility] [phone]  

## 2015-01-27 ENCOUNTER — Inpatient Hospital Stay: Admission: RE | Admit: 2015-01-27 | Payer: BLUE CROSS/BLUE SHIELD | Source: Ambulatory Visit

## 2015-04-21 ENCOUNTER — Encounter: Payer: Self-pay | Admitting: Hematology and Oncology

## 2015-04-21 ENCOUNTER — Ambulatory Visit (HOSPITAL_BASED_OUTPATIENT_CLINIC_OR_DEPARTMENT_OTHER): Payer: BLUE CROSS/BLUE SHIELD | Admitting: Hematology and Oncology

## 2015-04-21 ENCOUNTER — Telehealth: Payer: Self-pay | Admitting: Hematology and Oncology

## 2015-04-21 VITALS — BP 131/79 | HR 87 | Temp 98.1°F | Resp 18 | Ht 67.0 in | Wt 181.0 lb

## 2015-04-21 DIAGNOSIS — Z170 Estrogen receptor positive status [ER+]: Secondary | ICD-10-CM

## 2015-04-21 DIAGNOSIS — N898 Other specified noninflammatory disorders of vagina: Secondary | ICD-10-CM

## 2015-04-21 DIAGNOSIS — C50511 Malignant neoplasm of lower-outer quadrant of right female breast: Secondary | ICD-10-CM | POA: Diagnosis not present

## 2015-04-21 DIAGNOSIS — Z79811 Long term (current) use of aromatase inhibitors: Secondary | ICD-10-CM | POA: Diagnosis not present

## 2015-04-21 DIAGNOSIS — Z780 Asymptomatic menopausal state: Secondary | ICD-10-CM | POA: Insufficient documentation

## 2015-04-21 MED ORDER — ANASTROZOLE 1 MG PO TABS
1.0000 mg | ORAL_TABLET | Freq: Every day | ORAL | Status: DC
Start: 2015-04-21 — End: 2015-07-02

## 2015-04-21 NOTE — Progress Notes (Signed)
Unable to get in to exam room prior to MD.  No assesesment performed.

## 2015-04-21 NOTE — Telephone Encounter (Signed)
Appointments made and avs has been printed for patient,patient will have dexa at dr Mauri [place] office

## 2015-04-21 NOTE — Assessment & Plan Note (Signed)
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  Tamoxifen toxicities: 1. Musculoskeletal aches and pains 2. Right leg swelling: We will obtain an ultrasound of the leg today 10/20/2014 3. Intermittent hot flashes  Breast Cancer Surveillance: 1. Breast exam 10/20/2014: Normal 2. Mammogram: Since she had bilateral mastectomies there is no role of mammograms.

## 2015-04-21 NOTE — Addendum Note (Signed)
Addended by: Prentiss Bells on: 04/21/2015 05:19 PM   Modules accepted: Orders

## 2015-04-21 NOTE — Progress Notes (Signed)
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 Tumor was ER positive PR positive HER-2/neu negative with Ki-67 5%   08/07/2012 Breast MRI right breast 1.3 x 0.9 x 1.6 cm irregular enhancing spiculated mass in the middle third of the lateral aspect of the right breast . At 12:00 in the anterior third of the right breast was another enhancing mass measuring 7 x 6 x 5 mm   10/11/2012 Surgery Bilateral mastectomies: T1 N0 M0 stage IA right breast cancer   11/23/2012 -  Anti-estrogen oral therapy Tamoxifen 20 mg daily switched to anastrozole 04/27/2015 due to excessive vaginal discharge    CHIEF COMPLIANT: follow-up on tamoxifen therapy  INTERVAL HISTORY: Sherry Stafford is a 60 year old with above-mentioned history of right breast cancer treated with bilateral mastectomies and was on tamoxifen therapy since August 2014 her hot flashes and myalgias are reasonably tolerable however she has developed profound vaginal discharge which makes it very irritable and she has seen her primary care doctor at her gynecologist but that does not seem to be any other reason other than antiestrogen therapy with tamoxifen.  REVIEW OF SYSTEMS:   Constitutional: Denies fevers, chills or abnormal weight loss Eyes: Denies blurriness of vision Ears, nose, mouth, throat, and face: Denies mucositis or sore throat Respiratory: Denies cough, dyspnea or wheezes Cardiovascular: Denies  palpitation, chest discomfort Gastrointestinal:  Denies nausea, heartburn or change in bowel habits Skin: Denies abnormal skin rashes Lymphatics: Denies new lymphadenopathy or easy bruising Neurological:Denies numbness, tingling or new weaknesses Behavioral/Psych: Mood is stable, no new changes  Extremities: No lower extremity edema Gyn: vaginal discharge All other systems were reviewed with the patient and are negative.  I have reviewed the past medical history, past surgical history, social history and family history with the patient and they are unchanged from previous note.  ALLERGIES:  has No Known Allergies.  MEDICATIONS:  Current Outpatient Prescriptions  Medication Sig Dispense Refill  . ALPRAZolam (XANAX) 0.5 MG tablet Take 0.5 mg by mouth daily.  0  . amLODipine (NORVASC) 5 MG tablet Take 5 mg by mouth daily with breakfast.     . anastrozole (ARIMIDEX) 1 MG tablet Take 1 tablet (1 mg total) by mouth daily. 90 tablet 3  . buPROPion (WELLBUTRIN XL) 150 MG 24 hr tablet Take 150 mg by mouth daily with breakfast.     . CALCIUM PO Take 0.5 tablets by mouth daily.    . Cholecalciferol (VITAMIN D PO) Take by mouth.    . clindamycin-benzoyl peroxide (BENZACLIN) gel   2  . co-enzyme Q-10 30 MG capsule Take 30 mg by mouth 3 (three) times daily.    . fluconazole (DIFLUCAN) 150 MG tablet 1 (ONE) TABLET, ORAL, DAYS 1, 4, 7  0  . fluticasone (FLONASE) 50 MCG/ACT nasal spray Place 2 sprays into the nose daily as needed for allergies.     . furosemide (LASIX) 20  MG tablet     . LORazepam (ATIVAN) 0.5 MG tablet     . LOVAZA 1 G capsule     . methocarbamol (ROBAXIN) 500 MG tablet Take 1 tablet (500 mg total) by mouth every 6 (six) hours as needed. 40 tablet 1  . omeprazole (PRILOSEC) 40 MG capsule     . rosuvastatin (CRESTOR) 5 MG tablet Take 5 mg by mouth daily with breakfast.     . triamcinolone cream (KENALOG) 0.1 %      No current facility-administered medications for this visit.     PHYSICAL EXAMINATION: ECOG PERFORMANCE STATUS: 1 - Symptomatic but completely ambulatory  Filed Vitals:   04/21/15 1148  BP: 131/79  Pulse: 87  Temp: 98.1 F (36.7 C)  Resp: 18   Filed Weights   04/21/15 1148  Weight: 181 lb (82.101 kg)    GENERAL:alert, no distress and comfortable SKIN: skin color, texture, turgor are normal, no rashes or significant lesions EYES: normal, Conjunctiva are pink and non-injected, sclera clear OROPHARYNX:no exudate, no erythema and lips, buccal mucosa, and tongue normal  NECK: supple, thyroid normal size, non-tender, without nodularity LYMPH:  no palpable lymphadenopathy in the cervical, axillary or inguinal LUNGS: clear to auscultation and percussion with normal breathing effort HEART: regular rate & rhythm and no murmurs and no lower extremity edema ABDOMEN:abdomen soft, non-tender and normal bowel sounds MUSCULOSKELETAL:no cyanosis of digits and no clubbing  NEURO: alert & oriented x 3 with fluent speech, no focal motor/sensory deficits EXTREMITIES: No lower extremity edema   LABORATORY DATA:  I have reviewed the data as listed   Chemistry      Component Value Date/Time   NA 143 03/25/2014 1033   NA 142 10/04/2012 1034   K 4.1 03/25/2014 1033   K 3.9 10/04/2012 1034   CL 107 10/04/2012 1034   CO2 26 03/25/2014 1033   CO2 26 10/04/2012 1034   BUN 16.5 03/25/2014 1033   BUN 13 10/04/2012 1034   CREATININE 1.0 03/25/2014 1033   CREATININE 0.94 10/04/2012 1034      Component Value Date/Time   CALCIUM 9.5 03/25/2014 1033   CALCIUM 10.1 10/04/2012 1034   ALKPHOS 39* 03/25/2014 1033   ALKPHOS 55 10/04/2012 1034   AST 15 03/25/2014 1033   AST 15 10/04/2012 1034   ALT 13 03/25/2014 1033   ALT 16 10/04/2012 1034   BILITOT 0.22 03/25/2014 1033   BILITOT 0.2* 10/04/2012 1034       Lab Results  Component Value Date   WBC 4.2 03/25/2014   HGB 13.6 03/25/2014   HCT 42.0 03/25/2014   MCV 92.6 03/25/2014   PLT 221 03/25/2014    NEUTROABS 2.2 03/25/2014   ASSESSMENT & PLAN:  Breast cancer of lower-outer quadrant of right female breast ([place]) Right breast 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  Tamoxifen toxicities: 1. Musculoskeletal aches and pains 2. Right leg swelling: We will obtain an ultrasound of the leg today 10/20/2014 3. Intermittent hot flashes 4. Profound vaginal discharge: Because of this being a constant issue, patient would like [place] to switch her treatment to anastrozole. I discussed with her that anastrozole might sometimes increase the musculoskeletal aches and pains. But she is willing to try it for the next 6 months and decide what she wants to  do later. She is also due for a bone density test in July 2017. I recommended that she take anastrozole for the next 6 months and we will check her bone density tests and see her afterwards and then decide on the final treatment plan.  Breast Cancer Surveillance: 1. Breast exam 10/20/2014: Normal 2. Mammogram: Since she had bilateral mastectomies there is no role of mammograms.  Return to clinic in 6 months for follow-up. Orders Placed This Encounter  Procedures  . DG Bone Density    Standing Status: Future     Number of Occurrences:      Standing Expiration Date: 04/20/2016    Order Specific Question:  Reason for Exam (SYMPTOM  OR DIAGNOSIS REQUIRED)    Answer:  Post menopausal    Order Specific Question:  Preferred imaging location?    Answer:  [facility]   The patient has a good understanding of the overall plan. she agrees with it. she will call with any problems that may develop before the next visit here.   Rulon Eisenmenger, MD 04/21/2015

## 2015-07-02 ENCOUNTER — Other Ambulatory Visit: Payer: Self-pay

## 2015-07-02 DIAGNOSIS — C50511 Malignant neoplasm of lower-outer quadrant of right female breast: Secondary | ICD-10-CM

## 2015-07-02 MED ORDER — TAMOXIFEN CITRATE 20 MG PO TABS: 20.0000 mg | ORAL_TABLET | Freq: Every day | ORAL | Status: DC

## 2015-07-05 DIAGNOSIS — Z9010 Acquired absence of unspecified breast and nipple: Secondary | ICD-10-CM | POA: Insufficient documentation

## 2015-09-03 DIAGNOSIS — Z9013 Acquired absence of bilateral breasts and nipples: Secondary | ICD-10-CM | POA: Insufficient documentation

## 2015-10-14 ENCOUNTER — Telehealth: Payer: Self-pay | Admitting: Hematology and Oncology

## 2015-10-14 NOTE — Telephone Encounter (Signed)
Spoke with patient. Appointment confirmed for 11/05/15. Merleen Nicely.

## 2015-10-20 ENCOUNTER — Ambulatory Visit: Payer: BLUE CROSS/BLUE SHIELD | Admitting: Hematology and Oncology

## 2015-11-05 ENCOUNTER — Ambulatory Visit (HOSPITAL_BASED_OUTPATIENT_CLINIC_OR_DEPARTMENT_OTHER): Payer: BLUE CROSS/BLUE SHIELD | Admitting: Hematology and Oncology

## 2015-11-05 ENCOUNTER — Encounter: Payer: Self-pay | Admitting: Hematology and Oncology

## 2015-11-05 DIAGNOSIS — C50511 Malignant neoplasm of lower-outer quadrant of right female breast: Secondary | ICD-10-CM

## 2015-11-05 DIAGNOSIS — Z79811 Long term (current) use of aromatase inhibitors: Secondary | ICD-10-CM | POA: Diagnosis not present

## 2015-11-05 DIAGNOSIS — Z170 Estrogen receptor positive status [ER+]: Secondary | ICD-10-CM

## 2015-11-05 NOTE — Assessment & Plan Note (Signed)
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  Tamoxifen toxicities: 1. Musculoskeletal aches and pains 2. Right leg swelling: We will obtain an ultrasound of the leg today 10/20/2014 3. Intermittent hot flashes 4. Profound vaginal discharge: Because of this being a constant issue, patient would like [place] to switch her treatment to anastrozole. I discussed with her that anastrozole might sometimes increase the musculoskeletal aches and pains. But she is willing to try it for the next 6 months and decide what she wants to do later. She is also due for a bone density test in July 2017. I recommended that she take anastrozole for the next 6 months and we will check her bone density tests and see her afterwards and then decide on the final treatment plan.  Anastrozole toxicities:  Breast Cancer Surveillance: 1. Breast exam 11/05/2015: Normal 2. Mammogram: Since she had bilateral mastectomies there is no role of mammograms.  Return to clinic in 6 months for follow-up.

## 2015-11-05 NOTE — Progress Notes (Signed)
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 Tumor was ER positive PR positive HER-2/neu negative with Ki-67 5%     08/07/2012 Breast MRI    right breast 1.3 x 0.9 x 1.6 cm irregular enhancing spiculated mass in the middle third of the lateral aspect of the right breast . At 12:00 in the anterior third of the right breast was another enhancing mass measuring 7 x 6 x 5 mm     10/11/2012 Surgery    Bilateral mastectomies: T1 N0 M0 stage IA right breast cancer     11/23/2012 -  Anti-estrogen oral therapy    Tamoxifen 20 mg daily switched to anastrozole 04/27/2015 due to excessive vaginal discharge, switched back to tamoxifen because of myalgias      CHIEF COMPLIANT: Follow-up on tamoxifen continues to have vaginal discharge  INTERVAL HISTORY: Sherry Stafford is a 60 year old with above-mentioned history of right breast cancer treated with bilateral mastectomies and was on tamoxifen and was developing discharge and we switched her to anastrozole but after a month and anastrozole she developed severe myalgias and achiness in she discontinued anastrozole and went back to tamoxifen. Since she has been back to tamoxifen she continues to have vaginal discharge. She is seeing a new gynecologist in [place] next week.  REVIEW OF SYSTEMS:   Constitutional: Denies fevers, chills or abnormal weight loss Eyes: Denies blurriness of vision Ears, nose, mouth,  throat, and face: Denies mucositis or sore throat Respiratory: Denies cough, dyspnea or wheezes Cardiovascular: Denies palpitation, chest discomfort Gastrointestinal:  Denies nausea, heartburn or change in bowel habits Skin: Denies abnormal skin rashes Lymphatics: Denies new lymphadenopathy or easy bruising Neurological:Denies numbness, tingling or new weaknesses Behavioral/Psych: Mood is stable, no new changes  Extremities: No lower extremity edema Breast:  denies any pain or lumps or nodules  All other systems were reviewed with the patient and are negative.  I have reviewed the past medical history, past surgical history, social history and family history with the patient and they are unchanged from previous note.  ALLERGIES:  has No Known Allergies.  MEDICATIONS:  Current Outpatient Prescriptions  Medication Sig Dispense Refill  . ALPRAZolam (XANAX) 0.5 MG tablet Take 0.5 mg by mouth daily.  0  . amLODipine (NORVASC) 5 MG tablet Take 5 mg by mouth daily with breakfast.     . buPROPion (WELLBUTRIN XL) 150 MG 24 hr tablet Take 150 mg by mouth daily with breakfast.     . CALCIUM PO Take 0.5 tablets by mouth daily.    . Cholecalciferol (VITAMIN D PO) Take by mouth.    . clindamycin-benzoyl peroxide (BENZACLIN) gel   2  . co-enzyme Q-10 30 MG capsule Take 30 mg by mouth 3 (three) times daily.    . fluconazole (DIFLUCAN) 150 MG tablet 1 (ONE) TABLET, ORAL, DAYS 1, 4, 7  0  . fluticasone (FLONASE) 50 MCG/ACT  nasal spray Place 2 sprays into the nose daily as needed for allergies.     . furosemide (LASIX) 20 MG tablet     . LORazepam (ATIVAN) 0.5 MG tablet     . LOVAZA 1 G capsule     . methocarbamol (ROBAXIN) 500 MG tablet Take 1 tablet (500 mg total) by mouth every 6 (six) hours as needed. 40 tablet 1  . omeprazole (PRILOSEC) 40 MG capsule     . rosuvastatin (CRESTOR) 5 MG tablet Take 5 mg by mouth daily with breakfast.     . tamoxifen (NOLVADEX) 20 MG tablet Take 1 tablet (20 mg  total) by mouth daily. 90 tablet 1  . triamcinolone cream (KENALOG) 0.1 %      No current facility-administered medications for this visit.     PHYSICAL EXAMINATION: ECOG PERFORMANCE STATUS: 1 - Symptomatic but completely ambulatory  Vitals:   11/05/15 1121  BP: 127/90  Pulse: 85  Resp: 18  Temp: 99.1 F (37.3 C)   Filed Weights   11/05/15 1121  Weight: 182 lb 12.8 oz (82.9 kg)    GENERAL:alert, no distress and comfortable SKIN: skin color, texture, turgor are normal, no rashes or significant lesions EYES: normal, Conjunctiva are pink and non-injected, sclera clear OROPHARYNX:no exudate, no erythema and lips, buccal mucosa, and tongue normal  NECK: supple, thyroid normal size, non-tender, without nodularity LYMPH:  no palpable lymphadenopathy in the cervical, axillary or inguinal LUNGS: clear to auscultation and percussion with normal breathing effort HEART: regular rate & rhythm and no murmurs and no lower extremity edema ABDOMEN:abdomen soft, non-tender and normal bowel sounds MUSCULOSKELETAL:no cyanosis of digits and no clubbing  NEURO: alert & oriented x 3 with fluent speech, no focal motor/sensory deficits EXTREMITIES: No lower extremity edema BREAST: No palpable lumps or nodules in either axilla. bilateral breast reconstructions are intact (exam performed in the presence of a chaperone)  LABORATORY DATA:  I have reviewed the data as listed   Chemistry      Component Value Date/Time   NA 143 03/25/2014 1033   K 4.1 03/25/2014 1033   CL 107 10/04/2012 1034   CO2 26 03/25/2014 1033   BUN 16.5 03/25/2014 1033   CREATININE 1.0 03/25/2014 1033      Component Value Date/Time   CALCIUM 9.5 03/25/2014 1033   ALKPHOS 39 (L) 03/25/2014 1033   AST 15 03/25/2014 1033   ALT 13 03/25/2014 1033   BILITOT 0.22 03/25/2014 1033       Lab Results  Component Value Date   WBC 4.2 03/25/2014   HGB 13.6 03/25/2014   HCT 42.0 03/25/2014   MCV 92.6 03/25/2014   PLT 221  03/25/2014   NEUTROABS 2.2 03/25/2014     ASSESSMENT & PLAN:  Breast cancer of lower-outer quadrant of right female breast ([place]) 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  Tamoxifen toxicities: 1. Musculoskeletal aches and pains 2. Right leg swelling: We will obtain an ultrasound of the leg today 10/20/2014 3. Intermittent hot flashes 4. Profound vaginal discharge: We switched her to anastrozole but because of muscular skeletal pain since switched back to tamoxifen. She continues to have the vaginal dryness but decided to change her gynecologist to see if any other treatments may be available. I instructed her that if  vaginal estrogens could help it would be fine to take it because tamoxifen is going to protect her.  Breast Cancer Surveillance: 1. Breast exam 11/05/2015: Bilateral rest reconstructions were intact and no palpable lumps or nodules in the chest wall or axilla 2. Mammogram: Since she had bilateral mastectomies there is no role of mammograms.  Return to clinic in 6 months for follow-up.   No orders of the defined types were placed in this encounter.  The patient has a good understanding of the overall plan. she agrees with it. she will call with any problems that may develop before the next visit here.   Rulon Eisenmenger, MD 11/05/15

## 2016-03-01 ENCOUNTER — Other Ambulatory Visit: Payer: Self-pay | Admitting: Hematology and Oncology

## 2016-03-01 DIAGNOSIS — C50511 Malignant neoplasm of lower-outer quadrant of right female breast: Secondary | ICD-10-CM

## 2016-04-05 ENCOUNTER — Other Ambulatory Visit: Payer: Self-pay | Admitting: Emergency Medicine

## 2016-04-05 DIAGNOSIS — C50511 Malignant neoplasm of lower-outer quadrant of right female breast: Secondary | ICD-10-CM

## 2016-04-05 MED ORDER — TAMOXIFEN CITRATE 20 MG PO TABS: 20.0000 mg | ORAL_TABLET | Freq: Every day | ORAL | 3 refills | Status: DC

## 2016-10-30 ENCOUNTER — Telehealth: Payer: Self-pay

## 2016-10-30 NOTE — Telephone Encounter (Signed)
Called and left a message with new appt times due to call day

## 2016-11-04 ENCOUNTER — Ambulatory Visit: Payer: BLUE CROSS/BLUE SHIELD | Admitting: Hematology and Oncology

## 2016-11-10 ENCOUNTER — Ambulatory Visit: Payer: BLUE CROSS/BLUE SHIELD | Admitting: Hematology and Oncology

## 2016-11-25 ENCOUNTER — Ambulatory Visit: Payer: BLUE CROSS/BLUE SHIELD | Admitting: Hematology and Oncology

## 2016-11-29 ENCOUNTER — Ambulatory Visit: Payer: BLUE CROSS/BLUE SHIELD | Admitting: Hematology and Oncology

## 2016-11-29 NOTE — Assessment & Plan Note (Deleted)
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 Musculoskeletal aches and pains 2. Right leg swelling: We will obtain an ultrasound of the leg today 10/20/2014 3. Intermittent hot flashes 4. Profound vaginal dryness: We switched her to anastrozole but because of muscular skeletal pain since switched back to tamoxifen. Currently on vaginal estrogen  Breast Cancer Surveillance: 1. Breast exam 11/29/2016: Bilateral breast reconstructions and no palpable lumps or nodules in the chest wall or axilla 2. Mammogram: Since she had bilateral mastectomies there is no role of mammograms.  Return to clinic in 1 year for follow-up

## 2016-12-12 ENCOUNTER — Encounter: Payer: Self-pay | Admitting: Hematology and Oncology

## 2016-12-12 ENCOUNTER — Telehealth: Payer: Self-pay | Admitting: Hematology and Oncology

## 2016-12-12 ENCOUNTER — Ambulatory Visit (HOSPITAL_BASED_OUTPATIENT_CLINIC_OR_DEPARTMENT_OTHER): Payer: BLUE CROSS/BLUE SHIELD | Admitting: Hematology and Oncology

## 2016-12-12 DIAGNOSIS — Z170 Estrogen receptor positive status [ER+]: Secondary | ICD-10-CM | POA: Diagnosis not present

## 2016-12-12 DIAGNOSIS — Z79810 Long term (current) use of selective estrogen receptor modulators (SERMs): Secondary | ICD-10-CM

## 2016-12-12 DIAGNOSIS — C50511 Malignant neoplasm of lower-outer quadrant of right female breast: Secondary | ICD-10-CM | POA: Diagnosis not present

## 2016-12-12 MED ORDER — TAMOXIFEN CITRATE 20 MG PO TABS: 20.0000 mg | ORAL_TABLET | Freq: Every day | ORAL | 3 refills | Status: DC

## 2016-12-12 MED ORDER — TAMOXIFEN CITRATE 20 MG PO TABS: 20.0000 mg | ORAL_TABLET | Freq: Every day | ORAL | 0 refills | Status: DC

## 2016-12-12 NOTE — Progress Notes (Signed)
Patient Care Team: Anda Kraft, MD as PCP - General (Endocrinology)  DIAGNOSIS:  Encounter Diagnosis  Name Primary?  . 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 Tumor was ER positive PR positive HER-2/neu negative with Ki-67 5%      08/07/2012 Breast MRI    right breast 1.3 x 0.9 x 1.6 cm irregular enhancing spiculated mass in the middle third of the lateral aspect of the right breast . At 12:00 in the anterior third of the right breast was another enhancing mass measuring 7 x 6 x 5 mm      10/11/2012 Surgery    Bilateral mastectomies: T1 N0 M0 stage IA right breast cancer      11/23/2012 -  Anti-estrogen oral therapy    Tamoxifen 20 mg daily switched to anastrozole 04/27/2015 due to excessive vaginal discharge, switched back to tamoxifen because of myalgias       CHIEF COMPLIANT: Follow-up on tamoxifen therapy  INTERVAL HISTORY: Sherry Stafford is a 61 year old with above-mentioned history of bilateral mastectomies for right-sided breast cancer is currently on tamoxifen and appears to be tolerating it moderately well. She continues to have vaginal dryness, she is also complaining of brittle nails. She does have occasional hot flashes or muscle aches and pains but they're not bothering her too significantly. She denies any lumps or nodules and bilateral reconstructed breasts.  REVIEW OF SYSTEMS:   Constitutional: Denies fevers, chills or abnormal weight loss Eyes: Denies blurriness of vision Ears, nose, mouth, throat, and face: Denies mucositis or sore throat Respiratory: Denies cough, dyspnea or wheezes Cardiovascular: Denies palpitation, chest discomfort Gastrointestinal:  Denies nausea, heartburn or change  in bowel habits Skin: Denies abnormal skin rashes Lymphatics: Denies new lymphadenopathy or easy bruising Neurological:Denies numbness, tingling or new weaknesses Behavioral/Psych: Mood is stable, no new changes  Extremities: No lower extremity edema Breast: Bilateral reconstructed breasts All other systems were reviewed with the patient and are negative.  I have reviewed the past medical history, past surgical history, social history and family history with the patient and they are unchanged from previous note.  ALLERGIES:  has No Known Allergies.  MEDICATIONS:  Current Outpatient Prescriptions  Medication Sig Dispense Refill  . ALPRAZolam (XANAX) 0.5 MG tablet Take 0.5 mg by mouth daily.  0  . amLODipine (NORVASC) 5 MG tablet Take 5 mg by mouth daily with breakfast.     . buPROPion (WELLBUTRIN XL) 150 MG 24 hr tablet Take 150 mg by mouth daily with breakfast.     . CALCIUM PO Take 0.5 tablets by mouth daily.    . Cholecalciferol (VITAMIN D PO) Take by mouth.    . clindamycin-benzoyl peroxide (BENZACLIN) gel   2  . co-enzyme Q-10 30 MG capsule Take 30 mg by mouth 3 (three) times daily.    . fluconazole (DIFLUCAN) 150 MG tablet 1 (ONE) TABLET, ORAL, DAYS 1, 4, 7  0  . fluticasone (FLONASE) 50 MCG/ACT nasal spray Place 2 sprays into the nose daily as needed for allergies.     . furosemide (LASIX) 20 MG tablet     . LORazepam (ATIVAN) 0.5 MG tablet     . LOVAZA 1 G capsule     . methocarbamol (ROBAXIN) 500  MG tablet Take 1 tablet (500 mg total) by mouth every 6 (six) hours as needed. 40 tablet 1  . omeprazole (PRILOSEC) 40 MG capsule     . rosuvastatin (CRESTOR) 5 MG tablet Take 5 mg by mouth daily with breakfast.     . tamoxifen (NOLVADEX) 20 MG tablet Take 1 tablet (20 mg total) by mouth daily. 90 tablet 3  . triamcinolone cream (KENALOG) 0.1 %      No current facility-administered medications for this visit.     PHYSICAL EXAMINATION: ECOG PERFORMANCE STATUS: 1 - Symptomatic but  completely ambulatory  Vitals:   12/12/16 0917  BP: 118/82  Pulse: 84  Resp: 17  Temp: 98.4 F (36.9 C)  SpO2: 98%   Filed Weights   12/12/16 0917  Weight: 180 lb 6.4 oz (81.8 kg)    GENERAL:alert, no distress and comfortable SKIN: skin color, texture, turgor are normal, no rashes or significant lesions EYES: normal, Conjunctiva are pink and non-injected, sclera clear OROPHARYNX:no exudate, no erythema and lips, buccal mucosa, and tongue normal  NECK: supple, thyroid normal size, non-tender, without nodularity LYMPH:  no palpable lymphadenopathy in the cervical, axillary or inguinal LUNGS: clear to auscultation and percussion with normal breathing effort HEART: regular rate & rhythm and no murmurs and no lower extremity edema ABDOMEN:abdomen soft, non-tender and normal bowel sounds MUSCULOSKELETAL:no cyanosis of digits and no clubbing  NEURO: alert & oriented x 3 with fluent speech, no focal motor/sensory deficits EXTREMITIES: No lower extremity edema BREAST: No palpable lumps or nodules in bilateral reconstructed breasts (exam performed in the presence of a chaperone)  LABORATORY DATA:  I have reviewed the data as listed   Chemistry      Component Value Date/Time   NA 143 03/25/2014 1033   K 4.1 03/25/2014 1033   CL 107 10/04/2012 1034   CO2 26 03/25/2014 1033   BUN 16.5 03/25/2014 1033   CREATININE 1.0 03/25/2014 1033      Component Value Date/Time   CALCIUM 9.5 03/25/2014 1033   ALKPHOS 39 (L) 03/25/2014 1033   AST 15 03/25/2014 1033   ALT 13 03/25/2014 1033   BILITOT 0.22 03/25/2014 1033       Lab Results  Component Value Date   WBC 4.2 03/25/2014   HGB 13.6 03/25/2014   HCT 42.0 03/25/2014   MCV 92.6 03/25/2014   PLT 221 03/25/2014   NEUTROABS 2.2 03/25/2014    ASSESSMENT & PLAN:  Breast cancer of lower-outer quadrant of right female breast ([place]) 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 Musculoskeletal aches and pains: Stable 2. Right leg swelling: 10/20/2014: No DVT 3. Intermittent hot flashes 4. Profound vaginal discharge: Continues to be a challenge 5. Brittle nails: Encouraged her to take Biotene  Breast Cancer Surveillance: 1. Breast exam 12/12/2016: Bilateral breast reconstructions were intact and no palpable lumps or nodules in the chest wall or axilla 2. Mammogram: Since she had bilateral mastectomies there is no role of mammograms.  We will consider doing a breast cancer index next year to decide if she needs to stay on tamoxifen longer than 5 years.  Return to clinic in 1 year for follow-up.   I spent 25 minutes talking to the patient of which more  than half was spent in counseling and coordination of care.  No orders of the defined types were placed in this encounter.  The patient has a good understanding of the overall plan. she agrees with it. she will call with any problems that may develop before the next visit here.   Rulon Eisenmenger, MD 12/12/16

## 2016-12-12 NOTE — Telephone Encounter (Signed)
Gave patient avs and calendar with appts.  °

## 2016-12-12 NOTE — Assessment & Plan Note (Signed)
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  Tamoxifen toxicities: 1. Musculoskeletal aches and pains 2. Right leg swelling: 10/20/2014: No DVT 3. Intermittent hot flashes 4. Profound vaginal discharge: We switched her to anastrozole but because of musculo-skeletal pain since switched back to tamoxifen. She continues to have the vaginal dryness but decided to change her gynecologist to see if any other treatments may be available. I instructed her that if vaginal estrogens could help it would be fine to take it because tamoxifen is going to protect her.  Breast Cancer Surveillance: 1. Breast exam 12/12/2016: Bilateral breast reconstructions were intact and no palpable lumps or nodules in the chest wall or axilla 2. Mammogram: Since she had bilateral mastectomies there is no role of mammograms.  Return to clinic in 1 year for follow-up.

## 2017-02-21 DIAGNOSIS — J324 Chronic pansinusitis: Secondary | ICD-10-CM | POA: Insufficient documentation

## 2017-04-28 ENCOUNTER — Encounter: Payer: Self-pay | Admitting: *Deleted

## 2017-04-30 ENCOUNTER — Telehealth: Payer: Self-pay | Admitting: *Deleted

## 2017-04-30 NOTE — Telephone Encounter (Signed)
Called patient and LVM informing patient that appt scheduled for Monday 2/4 @ 7:30 will need to be r/s. Left office number for patient to call when office open tomorrow at 8. Also informed patient that I will try to call back.

## 2017-04-30 NOTE — Telephone Encounter (Signed)
Was able to reach pt. Discussed that MD unexpectedly out of office & appt for 2/4 will need to be r/s. She verbalized understanding & I r/s her for Friday 05/12/17 @ 8:30 with an arrival time of 8:00.

## 2017-05-01 ENCOUNTER — Ambulatory Visit: Payer: BLUE CROSS/BLUE SHIELD | Admitting: Neurology

## 2017-05-12 ENCOUNTER — Encounter (INDEPENDENT_AMBULATORY_CARE_PROVIDER_SITE_OTHER): Payer: Self-pay

## 2017-05-12 ENCOUNTER — Ambulatory Visit (INDEPENDENT_AMBULATORY_CARE_PROVIDER_SITE_OTHER): Payer: BLUE CROSS/BLUE SHIELD | Admitting: Neurology

## 2017-05-12 ENCOUNTER — Encounter: Payer: Self-pay | Admitting: Neurology

## 2017-05-12 VITALS — BP 135/87 | HR 85 | Ht 67.0 in | Wt 187.0 lb

## 2017-05-12 DIAGNOSIS — H918X3 Other specified hearing loss, bilateral: Secondary | ICD-10-CM

## 2017-05-12 DIAGNOSIS — Z853 Personal history of malignant neoplasm of breast: Secondary | ICD-10-CM

## 2017-05-12 DIAGNOSIS — R519 Headache, unspecified: Secondary | ICD-10-CM

## 2017-05-12 DIAGNOSIS — H539 Unspecified visual disturbance: Secondary | ICD-10-CM | POA: Diagnosis not present

## 2017-05-12 DIAGNOSIS — R0683 Snoring: Secondary | ICD-10-CM | POA: Diagnosis not present

## 2017-05-12 DIAGNOSIS — R51 Headache with orthostatic component, not elsewhere classified: Secondary | ICD-10-CM

## 2017-05-12 DIAGNOSIS — R42 Dizziness and giddiness: Secondary | ICD-10-CM

## 2017-05-12 NOTE — Patient Instructions (Addendum)
Vestibular migraine? MRI brain Labs Sleep evaluation with sleep doctor   Sleep Apnea Sleep apnea is a condition in which breathing pauses or becomes shallow during sleep. Episodes of sleep apnea usually last 10 seconds or longer, and they may occur as many as 20 times an hour. Sleep apnea disrupts your sleep and keeps your body from getting the rest that it needs. This condition can increase your risk of certain health problems, including:  Heart attack.  Stroke.  Obesity.  Diabetes.  Heart failure.  Irregular heartbeat.  There are three kinds of sleep apnea:  Obstructive sleep apnea. This kind is caused by a blocked or collapsed airway.  Central sleep apnea. This kind happens when the part of the brain that controls breathing does not send the correct signals to the muscles that control breathing.  Mixed sleep apnea. This is a combination of obstructive and central sleep apnea.  What are the causes? The most common cause of this condition is a collapsed or blocked airway. An airway can collapse or become blocked if:  Your throat muscles are abnormally relaxed.  Your tongue and tonsils are larger than normal.  You are overweight.  Your airway is smaller than normal.  What increases the risk? This condition is more likely to develop in people who:  Are overweight.  Smoke.  Have a smaller than normal airway.  Are elderly.  Are female.  Drink alcohol.  Take sedatives or tranquilizers.  Have a family history of sleep apnea.  What are the signs or symptoms? Symptoms of this condition include:  Trouble staying asleep.  Daytime sleepiness and tiredness.  Irritability.  Loud snoring.  Morning headaches.  Trouble concentrating.  Forgetfulness.  Decreased interest in sex.  Unexplained sleepiness.  Mood swings.  Personality changes.  Feelings of depression.  Waking up often during the night to urinate.  Dry mouth.  Sore throat.  How is  this diagnosed? This condition may be diagnosed with:  A medical history.  A physical exam.  A series of tests that are done while you are sleeping (sleep study). These tests are usually done in a sleep lab, but they may also be done at home.  How is this treated? Treatment for this condition aims to restore normal breathing and to ease symptoms during sleep. It may involve managing health issues that can affect breathing, such as high blood pressure or obesity. Treatment may include:  Sleeping on your side.  Using a decongestant if you have nasal congestion.  Avoiding the use of depressants, including alcohol, sedatives, and narcotics.  Losing weight if you are overweight.  Making changes to your diet.  Quitting smoking.  Using a device to open your airway while you sleep, such as: ? An oral appliance. This is a custom-made mouthpiece that shifts your lower jaw forward. ? A continuous positive airway pressure (CPAP) device. This device delivers oxygen to your airway through a mask. ? A nasal expiratory positive airway pressure (EPAP) device. This device has valves that you put into each nostril. ? A bi-level positive airway pressure (BPAP) device. This device delivers oxygen to your airway through a mask.  Surgery if other treatments do not work. During surgery, excess tissue is removed to create a wider airway.  It is important to get treatment for sleep apnea. Without treatment, this condition can lead to:  High blood pressure.  Coronary artery disease.  (Men) An inability to achieve or maintain an erection (impotence).  Reduced thinking abilities.  Follow  these instructions at home:  Make any lifestyle changes that your health care provider recommends.  Eat a healthy, well-balanced diet.  Take over-the-counter and prescription medicines only as told by your health care provider.  Avoid using depressants, including alcohol, sedatives, and narcotics.  Take steps  to lose weight if you are overweight.  If you were given a device to open your airway while you sleep, use it only as told by your health care provider.  Do not use any tobacco products, such as cigarettes, chewing tobacco, and e-cigarettes. If you need help quitting, ask your health care provider.  Keep all follow-up visits as told by your health care provider. This is important. Contact a health care provider if:  The device that you received to open your airway during sleep is uncomfortable or does not seem to be working.  Your symptoms do not improve.  Your symptoms get worse. Get help right away if:  You develop chest pain.  You develop shortness of breath.  You develop discomfort in your back, arms, or stomach.  You have trouble speaking.  You have weakness on one side of your body.  You have drooping in your face. These symptoms may represent a serious problem that is an emergency. Do not wait to see if the symptoms will go away. Get medical help right away. Call your local emergency services (911 in the [place] Do not drive yourself to the hospital. This information is not intended to replace advice given to you by your health care provider. Make sure you discuss any questions you have with your health care provider. Document Released: 03/04/2002 Document Revised: 11/08/2015 Document Reviewed: 12/22/2014 Elsevier Interactive Patient Education  2018 [employer].   Migraine Headache A migraine headache is an intense, throbbing pain on one side or both sides of the head. Migraines may also cause other symptoms, such as nausea, vomiting, and sensitivity to light and noise. What are the causes? Doing or taking certain things may also trigger migraines, such as:  Alcohol.  Smoking.  Medicines, such as: ? Medicine used to treat chest pain (nitroglycerine). ? Birth control pills. ? Estrogen pills. ? Certain blood pressure medicines.  Aged cheeses, chocolate, or  caffeine.  Foods or drinks that contain nitrates, glutamate, aspartame, or tyramine.  Physical activity.  Other things that may trigger a migraine include:  Menstruation.  Pregnancy.  Hunger.  Stress, lack of sleep, too much sleep, or fatigue.  Weather changes.  What increases the risk? The following factors may make you more likely to experience migraine headaches:  Age. Risk increases with age.  Family history of migraine headaches.  Being Caucasian.  Depression and anxiety.  Obesity.  Being a woman.  Having a hole in the heart (patent foramen ovale) or other heart problems.  What are the signs or symptoms? The main symptom of this condition is pulsating or throbbing pain. Pain may:  Happen in any area of the head, such as on one side or both sides.  Interfere with daily activities.  Get worse with physical activity.  Get worse with exposure to bright lights or loud noises.  Other symptoms may include:  Nausea.  Vomiting.  Dizziness.  General sensitivity to bright lights, loud noises, or smells.  Before you get a migraine, you may get warning signs that a migraine is developing (aura). An aura may include:  Seeing flashing lights or having blind spots.  Seeing bright spots, halos, or zigzag lines.  Having tunnel vision or blurred  vision.  Having numbness or a tingling feeling.  Having trouble talking.  Having muscle weakness.  How is this diagnosed? A migraine headache can be diagnosed based on:  Your symptoms.  A physical exam.  Tests, such as CT scan or MRI of the head. These imaging tests can help rule out other causes of headaches.  Taking fluid from the spine (lumbar puncture) and analyzing it (cerebrospinal fluid analysis, or CSF analysis).  How is this treated? A migraine headache is usually treated with medicines that:  Relieve pain.  Relieve nausea.  Prevent migraines from coming back.  Treatment may also  include:  Acupuncture.  Lifestyle changes like avoiding foods that trigger migraines.  Follow these instructions at home: Medicines  Take over-the-counter and prescription medicines only as told by your health care provider.  Do not drive or use heavy machinery while taking prescription pain medicine.  To prevent or treat constipation while you are taking prescription pain medicine, your health care provider may recommend that you: ? Drink enough fluid to keep your urine clear or pale yellow. ? Take over-the-counter or prescription medicines. ? Eat foods that are high in fiber, such as fresh fruits and vegetables, whole grains, and beans. ? Limit foods that are high in fat and processed sugars, such as fried and sweet foods. Lifestyle  Avoid alcohol use.  Do not use any products that contain nicotine or tobacco, such as cigarettes and e-cigarettes. If you need help quitting, ask your health care provider.  Get at least 8 hours of sleep every night.  Limit your stress. General instructions   Keep a journal to find out what may trigger your migraine headaches. For example, write down: ? What you eat and drink. ? How much sleep you get. ? Any change to your diet or medicines.  If you have a migraine: ? Avoid things that make your symptoms worse, such as bright lights. ? It may help to lie down in a dark, quiet room. ? Do not drive or use heavy machinery. ? Ask your health care provider what activities are safe for you while you are experiencing symptoms.  Keep all follow-up visits as told by your health care provider. This is important. Contact a health care provider if:  You develop symptoms that are different or more severe than your usual migraine symptoms. Get help right away if:  Your migraine becomes severe.  You have a fever.  You have a stiff neck.  You have vision loss.  Your muscles feel weak or like you cannot control them.  You start to lose your  balance often.  You develop trouble walking.  You faint. This information is not intended to replace advice given to you by your health care provider. Make sure you discuss any questions you have with your health care provider. Document Released: 03/14/2005 Document Revised: 10/02/2015 Document Reviewed: 08/31/2015 Elsevier Interactive Patient Education  2017 [employer].

## 2017-05-12 NOTE — Progress Notes (Signed)
[facility]    Provider:  Dr Jaynee Stafford Referring Provider: Anda Kraft, MD Primary Care Physician:  Sherry Seashore, MD  CC:  Headaches  HPI:  Sherry Stafford is a 62 y.o. female here as a referral from Sherry Stafford for headaches.  Past medical history breast cancer, hypertension. Symptoms started with vertigo August of 2017, it was acute, she was walking crooked but she was able to walk over a mile, she was dizzy when she got back home she threw up. Second episode was after she turned, she had spinning, she had to hold onto the car, she couldn't pump her gas, felt like she was being pulled into a vortex and fell. Then the headaaches started. She has a little headache daily, every 6 weeks she has a severe headache, worse coming home from work the car lights would make her eyes ache, sunglasses would help. Headaches are mainly behind the right eye, sometimes pulsating just aches, she has nausea, she has light sensitivy, sleeping helps. No Fhx of migraines. She has had migraines in the past. Vertigo has not happened with the migraines. This happened in the setting of hearing loss. She reports vision changes. She wakes with headache 1-2x a week. Husband says she is a pretty loud snorer. She has gained weight. Headaches can be occipital.   Reviewed notes, labs and imaging from outside physicians, which showed:  Reviewed referring physician notes.  Patient was referred for multiple issues.  Nasal problems for years.  She has environmental allergies.  She is been on antihistamines and nasal steroid sprays for years.  She has been treated with several rounds of antibiotics.  She has had 2 episodes of dizziness in the last 6 months.  They were fleeting and did not include spinning.  There was no specific change in hearing or tinnitus with the episodes.  She has also been having headaches intermittently located throughout her head.  Her primary care performed an x-ray that she was told the  right side was cloudy.  She has seen ENT Sherry Stafford regarding hearing loss and is using hearing aids from his office now.  She was told to address the sinus and headache issues relative to the vertigo first and then return to him for the vertigo if it seems to be related to the ear.. On my exam, was normal, ear exam was normal.  CT scan showed no evidence of sinusitis.  Vertigo was thought to be related to her ears.  It started after she got her hearing aids.  She also has tinnitus bilaterally.  Review of Systems: Patient complains of symptoms per HPI as well as the following symptoms: headaches, dizzines, pertinent negatives and positives per HPI. All others negative.   Social History   Socioeconomic History  . Marital status: Married    Spouse name: Not on file  . Number of children: 2  . Years of education: Not on file  . Highest education level: High school graduate  Social Needs  . Financial resource strain: Not on file  . Food insecurity - worry: Not on file  . Food insecurity - inability: Not on file  . Transportation needs - medical: Not on file  . Transportation needs - non-medical: Not on file  Occupational History  . Not on file  Tobacco Use  . Smoking status: Never Smoker  . Smokeless tobacco: Never Used  Substance and Sexual Activity  . Alcohol use: Yes    Alcohol/week: 1.2 oz    Types:  2 Glasses of wine per week    Comment: <2 glasses of wine per week   . Drug use: No  . Sexual activity: Not Currently  Other Topics Concern  . Not on file  Social History Narrative   Lives at home with husband   Right handed    Family History  Problem Relation Age of Onset  . Cancer Father        colon mets to lung and brain  . Colon cancer Father   . Heart disease Mother   . Diabetes Mother   . Hypertension Mother     Past Medical History:  Diagnosis Date  . Anxiety   . Arthritis    C5-6- bone spurs- treated by Chiropratic , achiness in hips & legs   . Breast cancer  ([name])   . Depression   . GERD (gastroesophageal reflux disease)   . Hot flashes   . Hyperlipidemia   . Hypertension    stress test 5 yrs. ago or > , states it was wnl, no need for cardiac f/u, states her BP is managed by Sherry Stafford    Past Surgical History:  Procedure Laterality Date  . APPENDECTOMY     as a teen   . blepheroplasy     bilateral   . BREAST RECONSTRUCTION  04/09/13  . BREAST RECONSTRUCTION WITH PLACEMENT OF TISSUE EXPANDER AND FLEX HD (ACELLULAR HYDRATED DERMIS) Bilateral 10/11/2012   Procedure: BREAST RECONSTRUCTION WITH PLACEMENT OF TISSUE EXPANDER AND FLEX HD (ACELLULAR HYDRATED DERMIS);  Surgeon: Crissie Reese, MD;  Location: [place];  Service: Plastics;  Laterality: Bilateral;  . BREAST SURGERY Left 2006   open biopsy (benign), R breast- stereotactic core biopsy- R breast- 07/2012  . MASTECTOMY W/ SENTINEL NODE BIOPSY Right 10/11/2012   Procedure:  bilateral MASTECTOMY WITH right  SENTINEL LYMPH NODE BIOPSY;  Surgeon: Odis Hollingshead, MD;  Location: [facility];  Service: General;  Laterality: Right;  right nuclear medicine injection 10:45 Sherry Harlow Mares to follow with reconstruction time alloted   . TOTAL MASTECTOMY Left 10/11/2012   Procedure: TOTAL MASTECTOMY;  Surgeon: Odis Hollingshead, MD;  Location: [facility];  Service: General;  Laterality: Left;  . TUBAL LIGATION      Current Outpatient Medications  Medication Sig Dispense Refill  . amLODipine (NORVASC) 5 MG tablet Take 5 mg by mouth daily with breakfast.     . buPROPion (WELLBUTRIN XL) 150 MG 24 hr tablet Take 150 mg by mouth daily with breakfast.     . fluticasone (FLONASE) 50 MCG/ACT nasal spray Place 2 sprays into the nose daily as needed for allergies.     Marland Kitchen LORazepam (ATIVAN) 0.5 MG tablet Take 0.5 mg by mouth daily as needed.     Marland Kitchen LOVAZA 1 G capsule     . meclizine (ANTIVERT) 25 MG tablet Take 25 mg by mouth daily as needed for dizziness.    . methocarbamol (ROBAXIN) 500 MG tablet Take 1 tablet (500 mg total) by  mouth every 6 (six) hours as needed. 40 tablet 1  . omeprazole (PRILOSEC) 40 MG capsule Take 40 mg by mouth as needed.     . rosuvastatin (CRESTOR) 5 MG tablet Take 5 mg by mouth daily with breakfast.     . tamoxifen (NOLVADEX) 20 MG tablet Take 1 tablet (20 mg total) by mouth daily. 30 tablet 0  . TURMERIC PO Take 750 mg by mouth daily.    Marland Kitchen Ubiquinol 100 MG CAPS Take 100 mg by  mouth daily.    Marland Kitchen CALCIUM PO Take 0.5 tablets by mouth daily.     No current facility-administered medications for this visit.     Allergies as of 05/12/2017 - Review Complete 05/12/2017  Allergen Reaction Noted  . Cefuroxime axetil  02/21/2017    Vitals: BP 135/87 (BP Location: Left Arm, Patient Position: Sitting)   Pulse 85   Ht 5\' 7"  (1.702 m)   Wt 187 lb (84.8 kg)   BMI 29.29 kg/m  Last Weight:  Wt Readings from Last 1 Encounters:  05/12/17 187 lb (84.8 kg)   Last Height:   Ht Readings from Last 1 Encounters:  05/12/17 5\' 7"  (1.702 m)    Physical exam: Exam: Gen: NAD, conversant, well nourised, overweight , well groomed                     CV: RRR, no MRG. No Carotid Bruits. No peripheral edema, warm, nontender Eyes: Conjunctivae clear without exudates or hemorrhage  Neuro: Detailed Neurologic Exam  Speech:    Speech is normal; fluent and spontaneous with normal comprehension.  Cognition:    The patient is oriented to person, place, and time;     recent and remote memory intact;     language fluent;     normal attention, concentration,     fund of knowledge Cranial Nerves:    The pupils are equal, round, and reactive to light. The fundi are normal and spontaneous venous pulsations are present. Visual fields are full to finger confrontation. Extraocular movements are intact. Trigeminal sensation is intact and the muscles of mastication are normal. The face is symmetric. The palate elevates in the midline. Hearing intact. Voice is normal. Shoulder shrug is normal. The tongue has normal  motion without fasciculations.   Coordination:    Normal finger to nose and heel to shin. Normal rapid alternating movements.   Gait:    Heel-toe and tandem gait are normal.   Motor Observation:    No asymmetry, no atrophy, and no involuntary movements noted. Tone:    Normal muscle tone.    Posture:    Posture is normal. normal erect    Strength:    Strength is V/V in the upper and lower limbs.      Sensation: intact to LT     Reflex Exam:  DTR's:    Deep tendon reflexes in the upper and lower extremities are normal bilaterally.   Toes:    The toes are downgoing bilaterally.   Clonus:    Clonus is absent.      Assessment/Plan:  62 year old female PMHx breast cancer here with vertigo, new onset headaches after age of 73, hearing loss  - MRI of the brain with thin cuts through the internal auditory canals : MRI of the brain due to positional and sometimes occipital headaches with dizziness/vertigo, hearing loss, vision changes and hx of breast cancer to assess for space occupying lesion, masses, strokes, MS, metastasis or any other etiology such as schwannoma, multiple concussions - Take frequent breaks - May be vestibular migraines however that is a diagnosis of exclusion need MRI of the brain as above - Snoring, morning headaches, fatigue: sleep eval, ESS 12  Orders Placed This Encounter  Procedures  . MR BRAIN W WO CONTRAST  . TSH  . Basic Metabolic Panel  . Sedimentation rate  . C-reactive protein  . Ambulatory referral to Sleep Studies     Sarina Ill, MD  [facility] [address] [place], [place] [address]  Phone [phone] Fax [phone]

## 2017-05-13 LAB — BASIC METABOLIC PANEL
BUN/Creatinine Ratio: 16 (ref 12–28)
BUN: 16 mg/dL (ref 8–27)
CO2: 22 mmol/L (ref 20–29)
Calcium: 10 mg/dL (ref 8.7–10.3)
Chloride: 107 mmol/L — ABNORMAL HIGH (ref 96–106)
Creatinine, Ser: 1 mg/dL (ref 0.57–1.00)
GFR calc Af Amer: 69 mL/min/{1.73_m2} (ref >59)
GFR calc non Af Amer: 60 mL/min/{1.73_m2} (ref >59)
Glucose: 93 mg/dL (ref 65–99)
Potassium: 3.9 mmol/L (ref 3.5–5.2)
Sodium: 146 mmol/L — ABNORMAL HIGH (ref 134–144)

## 2017-05-13 LAB — TSH: TSH: 3.57 u[IU]/mL (ref 0.450–4.500)

## 2017-05-13 LAB — SEDIMENTATION RATE: Sed Rate: 2 mm/hr (ref 0–40)

## 2017-05-13 LAB — C-REACTIVE PROTEIN: CRP: 1.1 mg/L (ref 0.0–4.9)

## 2017-05-15 ENCOUNTER — Telehealth: Payer: Self-pay | Admitting: Neurology

## 2017-05-15 ENCOUNTER — Telehealth: Payer: Self-pay | Admitting: *Deleted

## 2017-05-15 DIAGNOSIS — R42 Dizziness and giddiness: Secondary | ICD-10-CM | POA: Insufficient documentation

## 2017-05-15 MED ORDER — ALPRAZOLAM 0.25 MG PO TABS: ORAL_TABLET | ORAL | 0 refills | Status: DC

## 2017-05-15 NOTE — Telephone Encounter (Signed)
Spoke with patient and informed her that her labs are unremarkable. Also informed patient that Xanax was prescribed for her MRI. Reviewed instructions including that patient could not drive while taking this medication. She stated that she did not have a driver for the MRI and that she would be driving from [place]. She wants to keep her MRI as scheduled so she asked for Xanax prescription to be canceled.  Xanax canceled in Epic and called pharmacy and gave verbal order to Merrilee Seashore to cancel Xanax prescription.

## 2017-05-15 NOTE — Telephone Encounter (Signed)
Patient is schedule to have her MRI done this Wednesday 05/17/17 at the [facility] mobile unit. She informed me that she is claustrophobic and states she will need something to help her.

## 2017-05-15 NOTE — Telephone Encounter (Signed)
-----   Message from Melvenia Beam, MD sent at 05/13/2017  3:19 PM EST ----- Labs unremarkable

## 2017-05-15 NOTE — Telephone Encounter (Signed)
Dr. Jaynee Eagles has authorized a one time supply of Xanax 0.25 mg for this MRI. Prescription instructions as follows:   Take 1-2 tablets by mouth 30-60 minutes before MRI. May repeated if needed. DO NOT DRIVE. Dispense 4 tablets, 0 refills.   Faxed to pt's local [facility] pharmacy. Received a receipt of confirmation.

## 2017-05-17 ENCOUNTER — Ambulatory Visit: Payer: BLUE CROSS/BLUE SHIELD

## 2017-05-17 DIAGNOSIS — Z853 Personal history of malignant neoplasm of breast: Secondary | ICD-10-CM

## 2017-05-17 DIAGNOSIS — H918X3 Other specified hearing loss, bilateral: Secondary | ICD-10-CM

## 2017-05-17 DIAGNOSIS — H539 Unspecified visual disturbance: Secondary | ICD-10-CM | POA: Diagnosis not present

## 2017-05-17 DIAGNOSIS — R51 Headache with orthostatic component, not elsewhere classified: Secondary | ICD-10-CM

## 2017-05-17 DIAGNOSIS — R519 Headache, unspecified: Secondary | ICD-10-CM

## 2017-05-17 DIAGNOSIS — R42 Dizziness and giddiness: Secondary | ICD-10-CM

## 2017-05-17 MED ORDER — GADOPENTETATE DIMEGLUMINE 469.01 MG/ML IV SOLN: 15.0000 mL | Freq: Once | INTRAVENOUS | Status: AC | PRN

## 2017-05-18 ENCOUNTER — Telehealth: Payer: Self-pay | Admitting: *Deleted

## 2017-05-18 NOTE — Telephone Encounter (Signed)
-----   Message from Melvenia Beam, MD sent at 05/18/2017  5:02 PM EST ----- MRI brain unremarkable thanks

## 2017-05-18 NOTE — Telephone Encounter (Signed)
Called pt & LVM informing pt that her MRI brain is unremarkable. Informed pt that message does not require a call back but encouraged to call with any equations. Left office number & hours in message.

## 2017-06-14 ENCOUNTER — Encounter: Payer: Self-pay | Admitting: Neurology

## 2017-06-14 ENCOUNTER — Ambulatory Visit (INDEPENDENT_AMBULATORY_CARE_PROVIDER_SITE_OTHER): Payer: BLUE CROSS/BLUE SHIELD | Admitting: Neurology

## 2017-06-14 ENCOUNTER — Encounter (INDEPENDENT_AMBULATORY_CARE_PROVIDER_SITE_OTHER): Payer: Self-pay

## 2017-06-14 VITALS — BP 139/97 | HR 79 | Ht 67.0 in | Wt 187.0 lb

## 2017-06-14 DIAGNOSIS — G4719 Other hypersomnia: Secondary | ICD-10-CM

## 2017-06-14 DIAGNOSIS — C50511 Malignant neoplasm of lower-outer quadrant of right female breast: Secondary | ICD-10-CM | POA: Diagnosis not present

## 2017-06-14 DIAGNOSIS — R0683 Snoring: Secondary | ICD-10-CM | POA: Diagnosis not present

## 2017-06-14 DIAGNOSIS — Z170 Estrogen receptor positive status [ER+]: Secondary | ICD-10-CM | POA: Diagnosis not present

## 2017-06-14 DIAGNOSIS — Z780 Asymptomatic menopausal state: Secondary | ICD-10-CM

## 2017-06-14 DIAGNOSIS — R42 Dizziness and giddiness: Secondary | ICD-10-CM

## 2017-06-14 DIAGNOSIS — G2581 Restless legs syndrome: Secondary | ICD-10-CM

## 2017-06-14 NOTE — Addendum Note (Signed)
Addended by: Larey Seat on: 06/14/2017 12:14 PM   Modules accepted: Orders

## 2017-06-14 NOTE — Progress Notes (Addendum)
[facility]   Provider:  Larey Seat, [place] D  Primary Care Physician:  Merrilee Seashore, MD   Referring Provider: Merrilee Seashore, MD , Sarina Ill, MD    Chief Complaint  Patient presents with  . New Patient (Initial Visit)    pt alone, referral by Dr. Sarina Ill. rm 11, experiencing daytime sleepiness, usually sleeps from 9 pm-4:45 am. She snores in her sleep according  to her  husband. She states she has wokne up gasping for air, wakes up with a dry mouth, some morning headaches.Marland Kitchen     HPI:  Sherry Stafford is a 62 y.o. female , seen here  in a referral from Dr. Jaynee Eagles for a sleep evaluation,  I have the pleasure of meeting with Sherry Stafford today at 62 year old Caucasian right-handed female patient of Dr. Berta Minor Ahern's presenting on 14 June 2017 for a sleep evaluation.  The patient used to be followed by Dr. Wilson Singer for headaches, has a history with a diagnoses of breast cancer, hypertension, vertigo, headaches, and for a while she had low-grade headaches almost daily.  Her headaches were also characterized by an eye pain or ache, and there may have been photophobia as she felt better when she was sunglasses.  The headaches were localized mainly retro-orbital he on the right sometimes pulsating in quality ,sometimes more throbbing sometimes with nausea often with light sensitivity and she reports that sleeping helps.  She has no family history of migraines.  She has a history of nasal rhinitis with environmental allergies.   Chief complaint according to patient : " My husband reports that I snore' " I wake up choking- on my back "  " I have Restless legs "   Sleep habits are as follows: The patient reports that she has been entrained sleep routine, and when she prepares for sleep she is usually able to fall asleep immediately once she is in bed. She retreats at 8.30- 9.30 PM.  Her bedroom is cool quiet and dark.  She shares a bedroom with her husband who has observed  her to snore. She does not often needs to go to the bathroom at night, and some nights she will not have any bathroom breaks at all.  She is under the impression that when she sleeps on her back she snores louder, her mouth gets more dry and her tongue will be at times parched. She sleeps on her right side when she goes to sleep, with one pillow for head support only.  She may sleep through the night until she has to rise in the morning.  She rises between 4:30 and 4:45 AM.  Currently she will get around 7 hours of nocturnal sleep. She does not feel as refreshed or restored right now and wonders if her sleep quality has decreased due to any organic factors.  She does feel that she is under undue stress - her employment changed, her company spun off from the mother[employer] ' [employer]" . Her offices are moving. On weekends she will take naps in daytime -   Sleep medical history and family sleep history:  Brother has OSA just started CPAP. HTN, depression, cholesterol affect patient and her brother.  Social history: married, works for [employer] for over 40 years, no tobacco use never, ETOH ; 2-4 a week. Caffeine: coffee in AM and sometimes sodas.  No shift work history . Children - 8 and 64 years old  , healthy.   Review of Systems: Out of  a complete 14 system review, the patient complains of only the following symptoms, and all other reviewed systems are negative.  RLS , mostly after vigorous exercise.  Epworth score 15/ 24  , Fatigue severity score  28/   , Geriatric depression score  endorsed at 2/ 15 points    Social History   Socioeconomic History  . Marital status: Married    Spouse name: Not on file  . Number of children: 2  . Years of education: Not on file  . Highest education level: High school graduate  Social Needs  . Financial resource strain: Not on file  . Food insecurity - worry: Not on file  . Food insecurity - inability: Not on file  . Transportation needs - medical: Not on file  .  Transportation needs - non-medical: Not on file  Occupational History  . Not on file  Tobacco Use  . Smoking status: Never Smoker  . Smokeless tobacco: Never Used  Substance and Sexual Activity  . Alcohol use: Yes    Alcohol/week: 1.2 oz    Types: 2 Glasses of wine per week    Comment: <2 glasses of wine per week   . Drug use: No  . Sexual activity: Not Currently  Other Topics Concern  . Not on file  Social History Narrative   Lives at home with husband   Right handed    Family History  Problem Relation Age of Onset  . Cancer Father        colon mets to lung and brain  . Colon cancer Father   . Heart disease Mother   . Diabetes Mother   . Hypertension Mother     Past Medical History:  Diagnosis Date  . Anxiety   . Arthritis    C5-6- bone spurs- treated by Chiropratic , achiness in hips & legs   . Breast cancer ([place])   . Depression   . GERD (gastroesophageal reflux disease)   . Hot flashes   . Hyperlipidemia   . Hypertension    stress test 5 yrs. ago or > , states it was wnl, no need for cardiac f/u, states her BP is managed by Dr. Wilson Singer    Past Surgical History:  Procedure Laterality Date  . APPENDECTOMY     as a teen   . blepheroplasy     bilateral   . BREAST RECONSTRUCTION  04/09/13  . BREAST RECONSTRUCTION WITH PLACEMENT OF TISSUE EXPANDER AND FLEX HD (ACELLULAR HYDRATED DERMIS) Bilateral 10/11/2012   Procedure: BREAST RECONSTRUCTION WITH PLACEMENT OF TISSUE EXPANDER AND FLEX HD (ACELLULAR HYDRATED DERMIS);  Surgeon: Crissie Reese, MD;  Location: [facility];  Service: Plastics;  Laterality: Bilateral;  . BREAST SURGERY Left 2006   open biopsy (benign), R breast- stereotactic core biopsy- R breast- 07/2012  . MASTECTOMY W/ SENTINEL NODE BIOPSY Right 10/11/2012   Procedure:  bilateral MASTECTOMY WITH right  SENTINEL LYMPH NODE BIOPSY;  Surgeon: Odis Hollingshead, MD;  Location: [facility];  Service: General;  Laterality: Right;  right nuclear medicine injection 10:45 dr  Harlow Mares to follow with reconstruction time alloted   . TOTAL MASTECTOMY Left 10/11/2012   Procedure: TOTAL MASTECTOMY;  Surgeon: Odis Hollingshead, MD;  Location: [facility];  Service: General;  Laterality: Left;  . TUBAL LIGATION      Current Outpatient Medications  Medication Sig Dispense Refill  . amLODipine (NORVASC) 5 MG tablet Take 5 mg by mouth daily with breakfast.     . buPROPion [facility]  XL) 150 MG 24 hr tablet Take 150 mg by mouth daily with breakfast.     . CALCIUM PO Take 0.5 tablets by mouth daily.    . fluticasone (FLONASE) 50 MCG/ACT nasal spray Place 2 sprays into the nose daily as needed for allergies.     Marland Kitchen LORazepam (ATIVAN) 0.5 MG tablet Take 0.5 mg by mouth daily as needed.     Marland Kitchen LOVAZA 1 G capsule     . meclizine (ANTIVERT) 25 MG tablet Take 25 mg by mouth daily as needed for dizziness.    . methocarbamol (ROBAXIN) 500 MG tablet Take 1 tablet (500 mg total) by mouth every 6 (six) hours as needed. 40 tablet 1  . omeprazole (PRILOSEC) 40 MG capsule Take 40 mg by mouth as needed.     . rosuvastatin (CRESTOR) 5 MG tablet Take 5 mg by mouth daily with breakfast.     . tamoxifen (NOLVADEX) 20 MG tablet Take 1 tablet (20 mg total) by mouth daily. 30 tablet 0  . TURMERIC PO Take 750 mg by mouth daily.    Marland Kitchen Ubiquinol 100 MG CAPS Take 100 mg by mouth daily.     No current facility-administered medications for this visit.    Facility-Administered Medications Ordered in Other Visits  Medication Dose Route Frequency Provider Last Rate Last Dose  . gadopentetate dimeglumine (MAGNEVIST) injection 15 mL  15 mL Intravenous Once PRN Melvenia Beam, MD        Allergies as of 06/14/2017 - Review Complete 06/14/2017  Allergen Reaction Noted  . Cefuroxime axetil  02/21/2017    Vitals: BP (!) 139/97   Pulse 79   Ht 5\' 7"  (1.702 m)   Wt 187 lb (84.8 kg)   BMI 29.29 kg/m  Last Weight:  Wt Readings from Last 1 Encounters:  06/14/17 187 lb (84.8 kg)   YWV:PXTG mass index is  29.29 kg/m.     Last Height:   Ht Readings from Last 1 Encounters:  06/14/17 5\' 7"  (1.702 m)    Physical exam:  General: The patient is awake, alert and appears not in acute distress. The patient is well groomed. Head: Normocephalic, atraumatic. Neck is supple. Mallampati  3 ,  neck circumference:15- . Nasal airflow patent . Retrognathia is seen.  Cardiovascular:  Regular rate and rhythm , without  murmurs or carotid bruit, and without distended neck veins. Respiratory: Lungs are clear to auscultation. Skin:  Without evidence of edema, or rash Trunk: BMI is 29.3. The patient's posture is erect.   Neurologic exam : The patient is awake and alert, oriented to place and time. Attention span & concentration ability appears normal.  Speech is fluent, without dysarthria, dysphonia or aphasia.  Mood and affect are appropriate. Cranial nerves: Pupils are equal and briskly reactive to light. Funduscopic exam without evidence of pallor or edema. Extraocular movements  in vertical and horizontal planes intact and without nystagmus. Visual fields by finger perimetry are intact.Hearing to finger rub intact.  Facial sensation intact to fine touch. Facial motor strength is symmetric and tongue and uvula move midline. Shoulder shrug was symmetrical.  Motor exam:  Normal tone, muscle bulk and symmetric strength in all extremities. Sensory:  Fine touch, pinprick and vibration were tested in all extremities. Proprioception tested in the upper extremities was normal. Coordination: Rapid alternating movements in the fingers/hands was normal. Finger-to-nose maneuver  normal without evidence of ataxia, dysmetria or tremor. Gait and station: Patient walks without assistive device and is able unassisted  to climb up to the exam table. Strength within normal limits. Stance is stable and normal. Turns with 3 Steps. Deep tendon reflexes: in the upper and lower extremities are symmetric and intact.    Assessment:   After physical and neurologic examination, review of laboratory studies,  Personal review of imaging studies, reports of other /same  Imaging studies, results of polysomnography and / or neurophysiology testing and pre-existing records as far as provided in visit., my assessment is :   1) Snoring and excessive daytime sleepiness- sleep choking and daytime tiredness.  She does not feel this is related to depression, which she considers well controlled. 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   3) Migraine headaches have improved   4) RLS have improved because she has exercised less.  She has tried e'lyte supplement.    The patient was advised of the nature of the diagnosed disorder , the treatment options and the  risks for general health and wellness arising from not treating the condition.   I spent more than 40 minutes of face to face time with the patient.  Greater than 50% of time was spent in counseling and coordination of care. We have discussed the diagnosis and differential and I answered the patient's questions.    Plan:  Treatment plan and additional workup :   BCBS , I prefer a Watch pat HST to evaluate for OSA- please schedule either before April 6th or after April 18 th.     Larey Seat, MD 2/69/4854, 62:70 AM  Certified in Neurology by ABPN Certified in [place] by [facility] [address], [place], [place] [address]

## 2017-07-13 ENCOUNTER — Ambulatory Visit (INDEPENDENT_AMBULATORY_CARE_PROVIDER_SITE_OTHER): Payer: BLUE CROSS/BLUE SHIELD | Admitting: Neurology

## 2017-07-13 DIAGNOSIS — G4739 Other sleep apnea: Secondary | ICD-10-CM

## 2017-07-13 DIAGNOSIS — Z780 Asymptomatic menopausal state: Secondary | ICD-10-CM

## 2017-07-13 DIAGNOSIS — R42 Dizziness and giddiness: Secondary | ICD-10-CM

## 2017-07-13 DIAGNOSIS — C50511 Malignant neoplasm of lower-outer quadrant of right female breast: Secondary | ICD-10-CM

## 2017-07-13 DIAGNOSIS — G4731 Primary central sleep apnea: Secondary | ICD-10-CM | POA: Diagnosis not present

## 2017-07-13 DIAGNOSIS — G4719 Other hypersomnia: Secondary | ICD-10-CM

## 2017-07-13 DIAGNOSIS — R0683 Snoring: Secondary | ICD-10-CM

## 2017-07-13 DIAGNOSIS — Z170 Estrogen receptor positive status [ER+]: Secondary | ICD-10-CM

## 2017-07-13 DIAGNOSIS — G4733 Obstructive sleep apnea (adult) (pediatric): Secondary | ICD-10-CM

## 2017-07-26 ENCOUNTER — Telehealth: Payer: Self-pay | Admitting: Neurology

## 2017-07-26 NOTE — Telephone Encounter (Signed)
Pt has called asking for a call with the results of her sleep study

## 2017-07-27 NOTE — Telephone Encounter (Signed)
Dr Brett Fairy has not read her study yet at this moment. I will make her aware that pt is calling and as soon as she has the opportunity to read it I will call pt with her results.

## 2017-07-27 NOTE — Procedures (Signed)
PATIENT'S NAME:  Sherry Stafford, Sherry Stafford DOB:      [birth year]      MR#:    [id number]     DATE OF RECORDING: 07/13/2017 REFERRING M.D.:  Sarina Ill, M.D. Study Performed:  Split-Night Titration Study HISTORY: I have the pleasure of meeting with Sherry Stafford, a 62 year old Caucasian right-handed female patient of Dr. Berta Minor Ahern's, presenting for a sleep evaluation.  The patient has a diagnoses of breast cancer, hypertension, vertigo, headaches, depression, GERD and for a while she had low-grade headaches almost daily. She has no family history of migraines.  She has a history of nasal rhinitis with environmental allergies. Chief complaint according to patient: " My husband reports that I snore' " I wake up choking- on my back ",  " I have restless legs ", and she is excessively daytime sleepy.  The patient endorsed the Epworth Sleepiness Scale at 15/24 points   The patient's weight 187 pounds with a height of 67 (inches), resulting in a BMI of 29.4 kg/m2. The patient's neck circumference measured 15 inches.  CURRENT MEDICATIONS: Norvasc, Wellbutrin, Calcium, Flonase, Ativan, Lovaza, Antivert, Robaxin, Prilosec, Crestor, Nolvadex, Turmeric, Ubiquinol.     PROCEDURE:  This is a multichannel digital polysomnogram utilizing the SomnoStar 11.2 system.  Electrodes and sensors were applied and monitored per AASM Specifications.   EEG, EOG, Chin and Limb EMG, were sampled at 200 Hz.  ECG, Snore and Nasal Pressure, Thermal Airflow, Respiratory Effort, CPAP Flow and Pressure, Oximetry was sampled at 50 Hz. Digital video and audio were recorded.      BASELINE STUDY WITHOUT CPAP RESULTS: Lights Out was at 20:56 and Lights On at 05:00.  Total recording time (TRT) was 259, with a total sleep time (TST) of 233 minutes. The patient's sleep latency was 16 minutes.  REM latency was 74 minutes.  The sleep efficiency was 90. 0 %.    SLEEP ARCHITECTURE: WASO (Wake after sleep onset) was 12 minutes, Stage N1 was 8.5 minutes,  Stage N2 was 73.5 minutes, Stage N3 was 105 minutes and Stage R (REM sleep) was 46 minutes.  The percentages were Stage N1 3.6%, Stage N2 31.5%, Stage N3 45.1% and Stage R (REM sleep) 19.7%.   RESPIRATORY ANALYSIS:  There were a total of 200 respiratory events:  59 obstructive apneas, 17 central apneas and 124 hypopneas. The patient had 0 respiratory event related arousals (RERAs).  Loud Snoring was noted. The total APNEA/HYPOPNEA INDEX (AHI) was 51.5 /hour and the total RESPIRATORY DISTURBANCE INDEX was 51.5 /hour.  48 events occurred in REM sleep and 221 events in NREM. The REM AHI was 62.6, /hour versus a non-REM AHI of 48.8 /hour. The patient spent 407 minutes sleep time in the supine position 0 minutes in non-supine. The supine AHI was 51.5 /hour versus a non-supine AHI of 0.0 /hour.  OXYGEN SATURATION & C02:  The wake baseline 02 saturation was 94%, with the lowest being 76%. Time spent below 89% saturation equaled 34 minutes.Capnography was not performed.   PERIODIC LIMB MOVEMENTS: The patient had a total of 42 Periodic Limb Movements.  The Periodic Limb Movement (PLM) index was 10.8 /hour and the PLM Arousal index was 1.3 /hour. The arousals were noted as: 38 were spontaneous, 5 were associated with PLMs, and 41 were associated with respiratory events. Audio and video analysis did not show any abnormal or unusual movements, behaviors, phonations or vocalizations Snoring was noted. EKG was in keeping with normal sinus rhythm (NSR)  TITRATION STUDY WITH  CPAP RESULTS:   CPAP was initiated at 5.0 cmH20 with heated humidity per AASM split night standards and pressure was advanced to 12.0 cmH20 because of hypopneas, apneas and desaturations.  At a PAP pressure of 12.0 cmH20, there was a reduction of the AHI to 0.0 /hour. The patient was placed on a Fisher Paykel Simplus FFM in small size, CPAP with Heated humidity, no EPR.   Total recording time (TRT) was 225 minutes, with a total sleep time (TST)  of 174 minutes. The patient's sleep latency was 62.5 minutes. REM latency was 92 minutes.  The sleep efficiency was 77.3 %.    SLEEP ARCHITECTURE: Wake after sleep was 36.5 minutes, Stage N1 12 minutes, Stage N2 45 minutes, Stage N3 66 minutes and Stage R (REM sleep) 51 minutes. The percentages were: Stage N1 6.9%, Stage N2 25.9%, Stage N3 37.9% and Stage R (REM sleep) 29.3%.  RESPIRATORY ANALYSIS:  There were a total of 32 respiratory events: 0 obstructive apneas, 23 central apneas and 0 mixed apneas with a total of 23 apneas and an apnea index (AI) of 7.9. There were 9 hypopneas with a hypopnea index of 3.1 /hour. The patient also had 0 respiratory event related arousals (RERAs).      The total APNEA/HYPOPNEA INDEX (AHI) was 11.0 /hour and the total RESPIRATORY DISTURBANCE INDEX was 11.0 /hour.  4 events occurred in REM sleep and 28 events in NREM. The REM AHI was 4.7 /hour versus a non-REM AHI of 13.7 /hour. The patient spent 100% of total sleep time in the supine position. The supine AHI was 11.0 /hour, versus a non-supine AHI of 0.0/hour.  OXYGEN SATURATION & C02:  The wake baseline 02 saturation was 96%, with the lowest being 89%. Time spent below 89% saturation equaled 0 minutes.  PERIODIC LIMB MOVEMENTS:   The patient had a total of 40 Periodic Limb Movements. The Periodic Limb Movement (PLM) index was 13.8 /hour and the PLM Arousal index was 3.4 /hour. The arousals were noted as: 21 were spontaneous, 10 were associated with PLMs, and 10 were associated with respiratory events.  The patient was fitted with a Simplus mask in small size.   POLYSOMNOGRAPHY IMPRESSION :   1. Severe Complex, mostly Obstructive Sleep Apnea (OSA) associated with hypoxemia, but with high sleep efficiency. CPAP alleviated AHI to 0.0 at 12 cm water, but central apneas emerged under CPAP treatment.  2. PLMs emerged under CPAP treatment  3. Loud Snoring was alleviated.   RECOMMENDATIONS:  I like for this complex sleep  apnea patient to have a narrow range auto-CPAP setting between 7 and 14 cm water, 2 cm EPR, patient to be fitted for nasal pillow or nasal mask.    A follow up appointment will be scheduled in the Sleep Clinic at [facility].      I certify that I have reviewed the entire raw data recording prior to the issuance of this report in accordance with the Standards of Accreditation of the American Academy of Sleep Medicine (AASM)   Larey Seat, M.D.   07-27-2017  Diplomat, American Board of Psychiatry and Neurology  Diplomat, [place] of Sleep Medicine Medical Director, [place] [facility]   Cc Dr. Ashby Dawes and Dr. Jaynee Eagles

## 2017-07-27 NOTE — Addendum Note (Signed)
Addended by: Larey Seat on: 07/27/2017 05:13 PM   Modules accepted: Orders

## 2017-07-27 NOTE — Telephone Encounter (Signed)
Move study to top

## 2017-07-28 ENCOUNTER — Telehealth: Payer: Self-pay | Admitting: Neurology

## 2017-07-28 NOTE — Telephone Encounter (Signed)
-----   Message from Larey Seat, MD sent at 07/27/2017  5:13 PM EDT ----- 1. Severe Complex, mostly Obstructive Sleep Apnea (OSA) associated with hypoxemia, but with high sleep efficiency. CPAP alleviated AHI to 0.0 at 12 cm water, but central apneas emerged under CPAP treatment.  2. PLMs emerged under CPAP treatment  3. Loud Snoring was alleviated.   RECOMMENDATIONS:  I like for this complex sleep apnea patient to have a narrow range auto-CPAP setting between 7 and 14 cm water, 2 cm EPR, patient to be fitted for nasal pillow or nasal mask.

## 2017-07-28 NOTE — Telephone Encounter (Signed)
I called pt. I advised pt that Dr. Brett Fairy reviewed their sleep study results and found that pt has severe sleep apnea. Dr. Brett Fairy recommends that pt starts CPAP with a narrow range of 7-14 cm of water pressure. I reviewed PAP compliance expectations with the pt. Pt is agreeable to starting a CPAP. I advised pt that an order will be sent to a DME, [facility], and [facility] will call the pt within about one week after they file with the pt's insurance. [facility] will show the pt how to use the machine, fit for masks, and troubleshoot the CPAP if needed. A follow up appt was made for insurance purposes with Cecille Rubin, NP on Nov 01, 2017 at 7:45 am. Pt verbalized understanding to arrive 15 minutes early and bring their CPAP. A letter with all of this information in it will be mailed to the pt as a reminder. I verified with the pt that the address we have on file is correct. Pt verbalized understanding of results. Pt had no questions at this time but was encouraged to call back if questions arise.

## 2017-09-12 IMAGING — MR MR LUMBAR SPINE W/O CM
5 series · 44 of 48 positions shown · non-contrast
Comparison: CT abdomen and pelvis 10/30/2006.

CLINICAL DATA: Low back pain radiating to BILATERAL hips and
BILATERAL inner thighs. Symptom duration unspecified.

EXAM:
MRI LUMBAR SPINE WITHOUT CONTRAST
TECHNIQUE: Multiplanar, multisequence MR imaging of the lumbar spine was
performed. No intravenous contrast was administered.

[Series 3: tirm sag · sagittal · 4.0mm · 0.55mm/px · 6 of 13 slices shown]
[im 1/13]
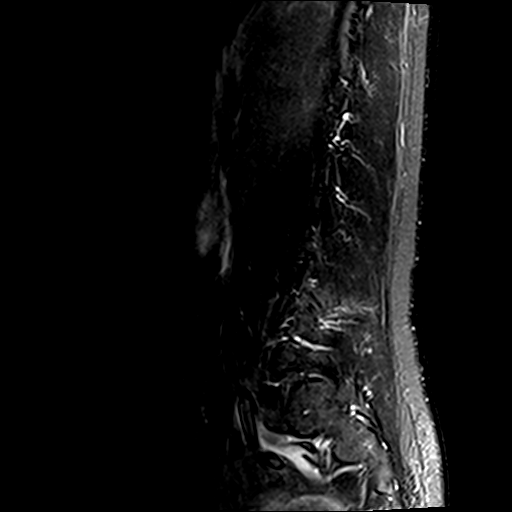
[im 3/13]
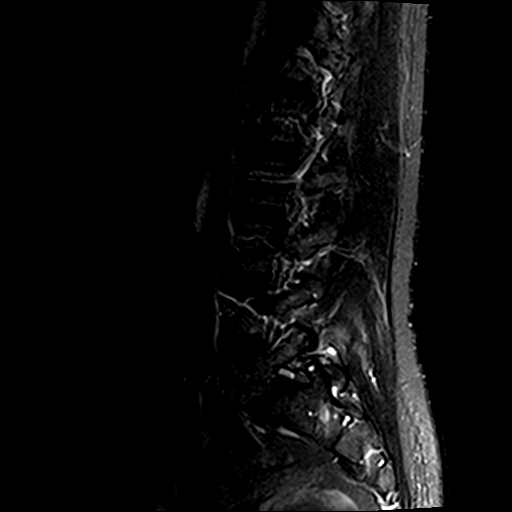
[im 5/13]
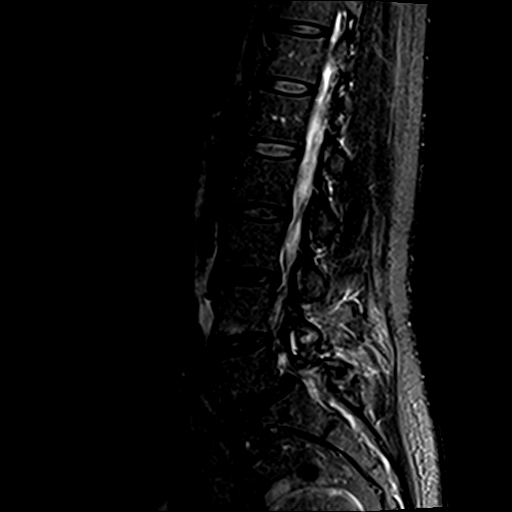
[im 8/13]
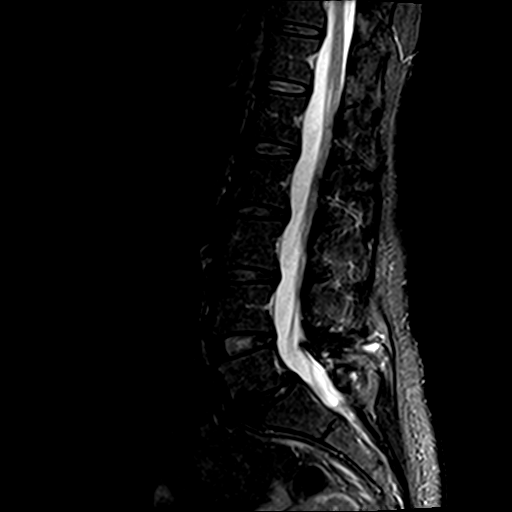
[im 10/13]
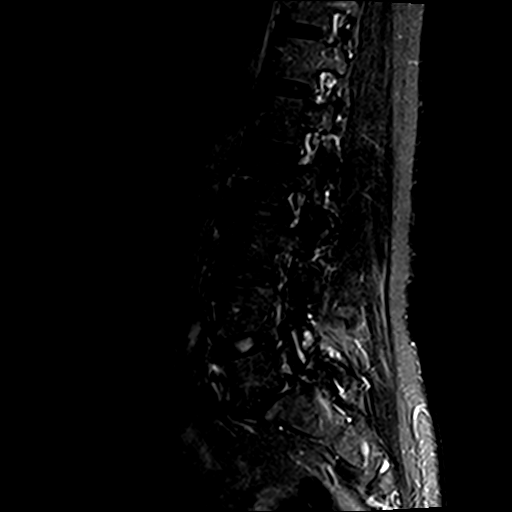
[im 13/13]
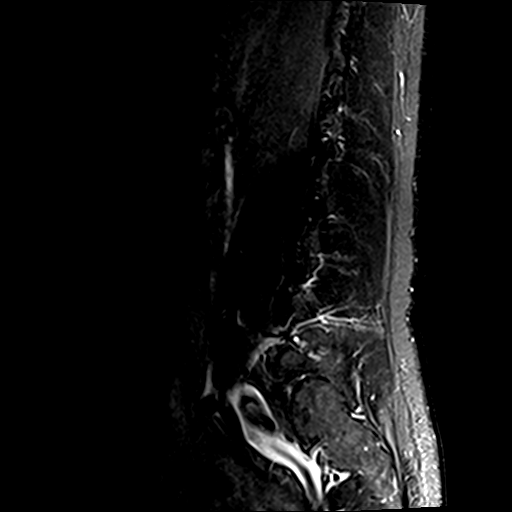

[Series 4: T2 · sagittal · 4.0mm · 0.88mm/px · 6 of 13 slices shown (1 of 2)]
[im 1/13]
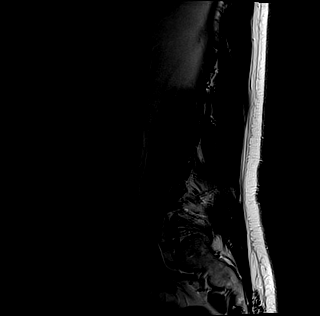
[im 3/13]
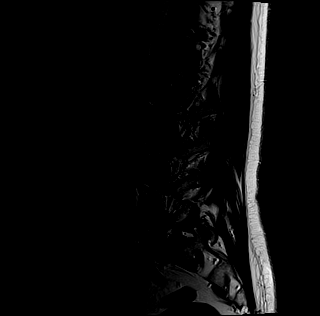
[im 5/13]
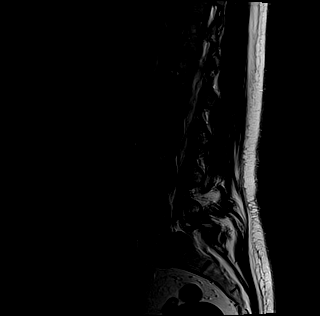
[im 8/13]
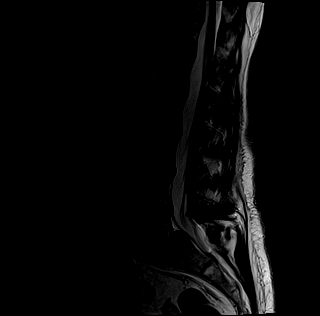
[im 10/13]
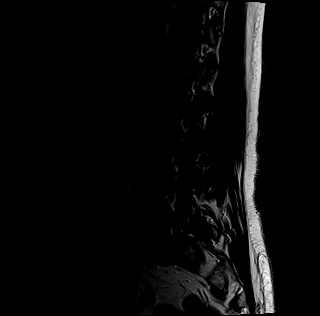
[im 13/13]
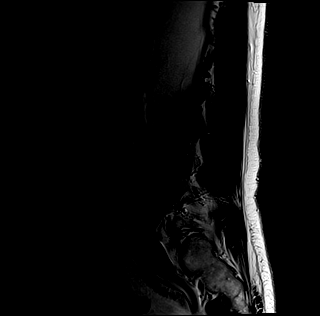

[Series 5: T1 · sagittal · 4.0mm · 0.88mm/px · 6 of 13 slices shown (1 of 2)]
[im 1/13]
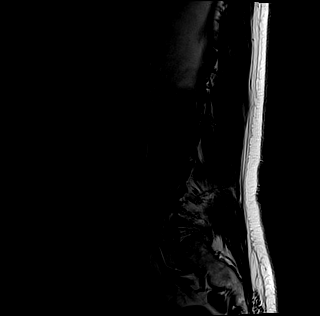
[im 3/13]
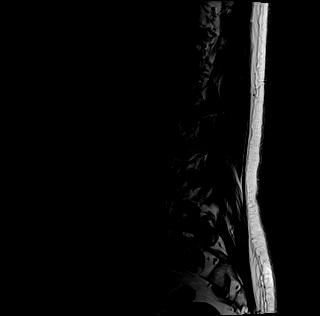
[im 5/13]
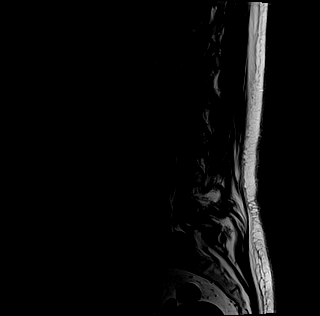
[im 8/13]
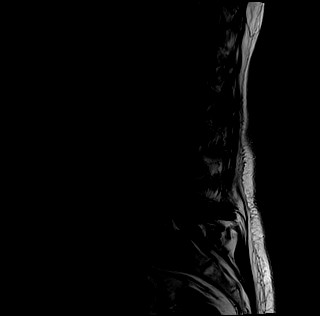
[im 10/13]
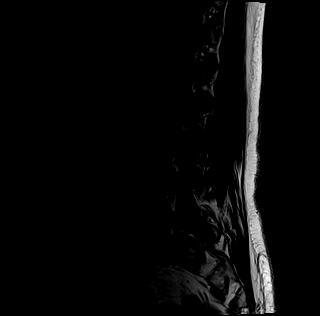
[im 13/13]
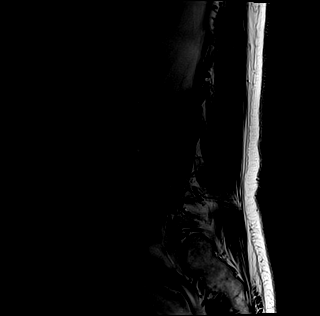

[Series 6: T1 · axial · 4.0mm · 0.70mm/px · z∈[-57,+111]mm · 11 of 31 slices shown (2 of 2)]
[im 1/31]
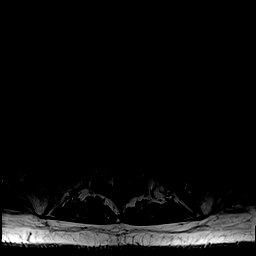
[im 3/31]
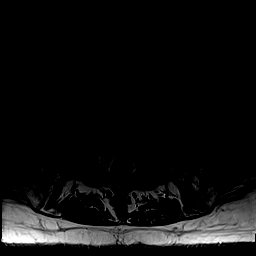
[im 5/31]
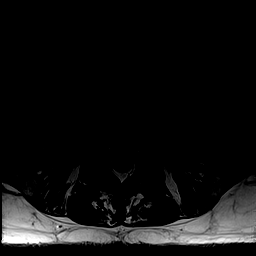
[im 7/31]
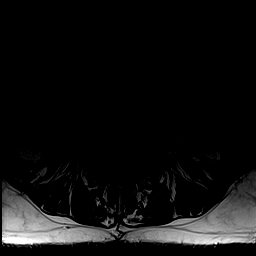
[im 9/31]
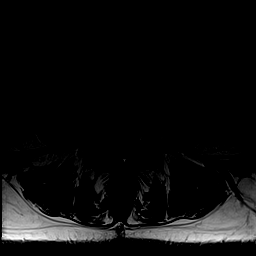
[im 13/31]
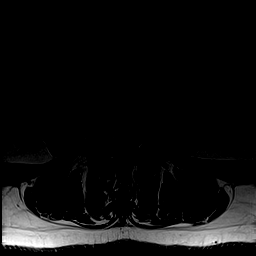
[im 16/31]
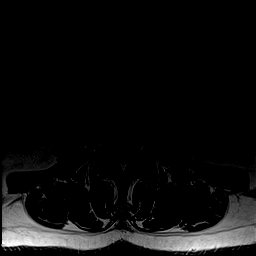
[im 18/31]
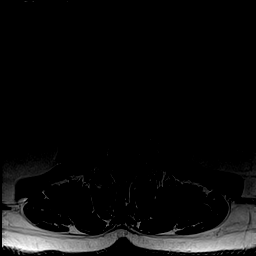
[im 22/31]
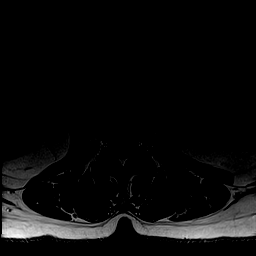
[im 26/31]
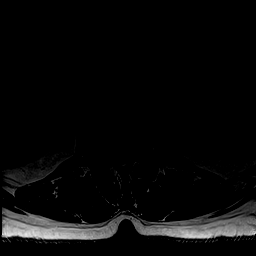
[im 31/31]
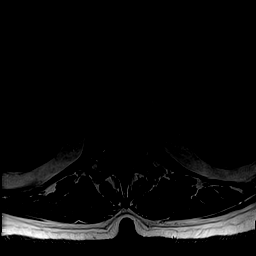

[Series 7: T2 · axial · 4.0mm · 0.70mm/px · z∈[-57,+111]mm · 15 of 31 slices shown (2 of 2)]
[im 1/31]
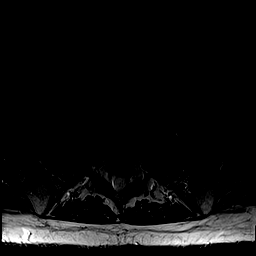
[im 3/31]
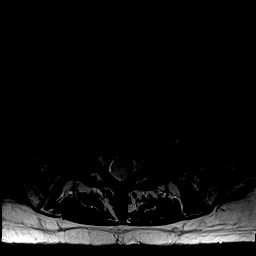
[im 5/31]
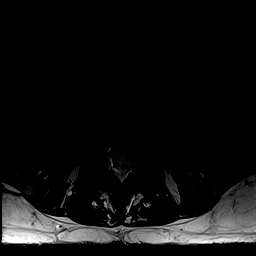
[im 7/31]
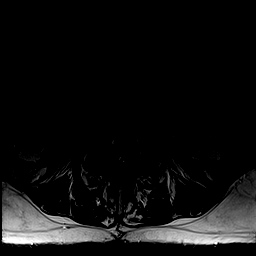
[im 9/31]
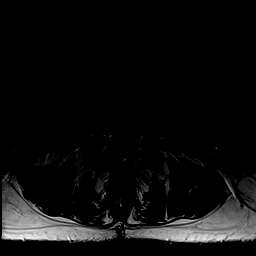
[im 11/31]
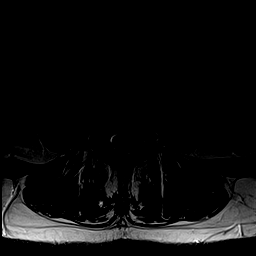
[im 13/31]
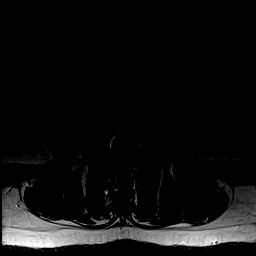
[im 16/31]
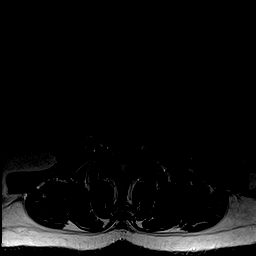
[im 18/31]
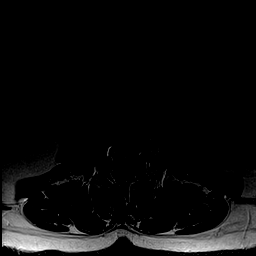
[im 20/31]
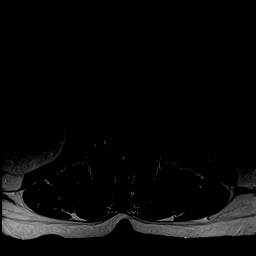
[im 22/31]
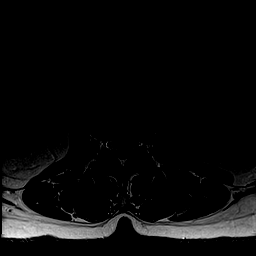
[im 24/31]
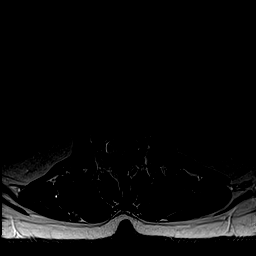
[im 26/31]
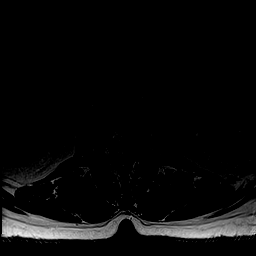
[im 28/31]
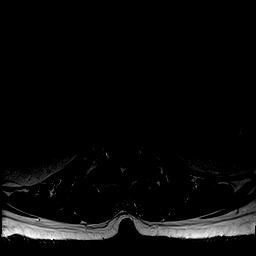
[im 31/31]
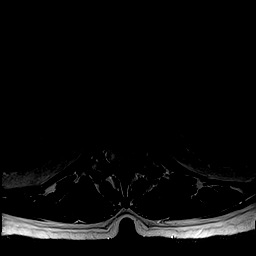

[44 of 48 positions shown; findings below may reference images not displayed]

FINDINGS: Segmentation: Normal.

Alignment: 6 mm anterolisthesis L5 on S1 relates to BILATERAL L5
pars defects.

Vertebrae: No worrisome osseous lesion.

Conus medullaris: Normal in size, signal, and location.

Paraspinal tissues: No evidence for hydronephrosis or paravertebral
mass.

Disc levels:

L1-L2:  Normal.

L2-L3:  Mild bulge.  Mild facet arthropathy.  No impingement.

L3-L4:  Mild bulge.  Mild facet arthropathy.  No impingement.

L4-L5:  Mild bulge.  Mild facet arthropathy.  No impingement.

L5-S1: 6 mm anterolisthesis. Disc space narrowing. Uncovering of the
disc centrally with bulging. BILATERAL L5 pars defects. Mild facet
arthropathy with effusions. No spinal stenosis or subarticular zone
narrowing. BILATERAL foraminal narrowing due to a combination of
slip, facet disease, and disc material results in BILATERAL L5 nerve
root compression.

Compared with prior CT, anterolisthesis is increased.
IMPRESSION: 6 mm anterolisthesis L5-S1 relates to BILATERAL L5 pars defects
along with facet joint degenerative change. Uncovering of the disc
with annular bulging in conjunction with disc space narrowing
results in BILATERAL foraminal narrowing at L5 nerve root
impingement. Progression of disease since 0448.

Mild annular bulging L2-3, L3-4, and L4-5.

## 2017-10-18 ENCOUNTER — Other Ambulatory Visit: Payer: Self-pay

## 2017-10-18 MED ORDER — TAMOXIFEN CITRATE 20 MG PO TABS: 20.0000 mg | ORAL_TABLET | Freq: Every day | ORAL | 0 refills | Status: DC

## 2017-10-30 ENCOUNTER — Encounter: Payer: Self-pay | Admitting: Nurse Practitioner

## 2017-10-31 NOTE — Progress Notes (Signed)
[facility]  PATIENT: Sherry Stafford DOB: 1955/12/21   REASON FOR VISIT: Follow-up for newly diagnosed obstructive sleep apnea here for initial CPAP HISTORY FROM: Patient    HISTORY OF PRESENT ILLNESS:UPDATE 8/7/2019CM Sherry Stafford 62 year old female returns for follow-up with newly diagnosed obstructive sleep apnea, here for initial CPAP.  She is doing well with her CPAP she has been through several mask and likes the mask that she currently has.  CPAP data dated 10/01/2017-10/30/2017 shows average usage 7 hours 22 minutes.  Set pressure 5 to 14 cm.  EPR level 3.  Leaks 95th percentile at 12.  AHI 2.9.  She was in [place] for a week and did not take her equipment.  She returns for reevaluation   06/14/17 CDI have the pleasure of meeting with Sherry Stafford today at 62 year old Caucasian right-handed female patient of Dr. Berta Minor Ahern's presenting on 14 June 2017 for a sleep evaluation.  The patient used to be followed by Dr. Wilson Singer for headaches, has a history with a diagnoses of breast cancer, hypertension, vertigo, headaches, and for a while she had low-grade headaches almost daily.  Her headaches were also characterized by an eye pain or ache, and there may have been photophobia as she felt better when she was sunglasses.  The headaches were localized mainly retro-orbital he on the right sometimes pulsating in quality ,sometimes more throbbing sometimes with nausea often with light sensitivity and she reports that sleeping helps.  She has no family history of migraines.  She has a history of nasal rhinitis with environmental allergies.   Chief complaint according to patient : " My husband reports that I snore' " I wake up choking- on my back "  " I have Restless legs "   Sleep habits are as follows: The patient reports that she has been entrained sleep routine, and when she prepares for sleep she is usually able to fall asleep immediately once she is in bed. She retreats at  8.30- 9.30 PM.  Her bedroom is cool quiet and dark.  She shares a bedroom with her husband who has observed her to snore. She does not often needs to go to the bathroom at night, and some nights she will not have any bathroom breaks at all.  She is under the impression that when she sleeps on her back she snores louder, her mouth gets more dry and her tongue will be at times parched. She sleeps on her right side when she goes to sleep, with one pillow for head support only.  She may sleep through the night until she has to rise in the morning.  She rises between 4:30 and 4:45 AM.  Currently she will get around 7 hours of nocturnal sleep. She does not feel as refreshed or restored right now and wonders if her sleep quality has decreased due to any organic factors.  She does feel that she is under undue stress - her employment changed, her company spun off from the mother[employer]" . Her offices are moving. On weekends she will take naps in daytime -     REVIEW OF SYSTEMS: Full 14 system review of systems performed and notable only for those listed, all others are neg:  Constitutional: Fatigue Cardiovascular: neg Ear/Nose/Throat: neg  Skin: neg Eyes: neg Respiratory: neg Gastroitestinal: neg  Hematology/Lymphatic: neg  Endocrine: neg Musculoskeletal:neg Allergy/Immunology: neg Neurological: neg Psychiatric: neg Sleep : Obstructive sleep apnea with CPAP, history of restless legs   ALLERGIES: Allergies  Allergen Reactions  . Cefuroxime Axetil     Other reaction(s): yeast infection    HOME MEDICATIONS: Outpatient Medications Prior to Visit  Medication Sig Dispense Refill  . amLODipine (NORVASC) 5 MG tablet Take 5 mg by mouth daily with breakfast.     . buPROPion (WELLBUTRIN XL) 150 MG 24 hr tablet Take 150 mg by mouth daily with breakfast.     . CALCIUM PO Take 0.5 tablets by mouth daily.    . fluticasone (FLONASE) 50 MCG/ACT nasal spray Place 2 sprays into the nose daily as needed  for allergies.     Marland Kitchen LORazepam (ATIVAN) 0.5 MG tablet Take 0.5 mg by mouth daily as needed.     Marland Kitchen LOVAZA 1 G capsule     . meclizine (ANTIVERT) 25 MG tablet Take 25 mg by mouth daily as needed for dizziness.    . methocarbamol (ROBAXIN) 500 MG tablet Take 1 tablet (500 mg total) by mouth every 6 (six) hours as needed. 40 tablet 1  . omeprazole (PRILOSEC) 40 MG capsule Take 40 mg by mouth as needed.     . rosuvastatin (CRESTOR) 5 MG tablet Take 5 mg by mouth daily with breakfast.     . tamoxifen (NOLVADEX) 20 MG tablet Take 1 tablet (20 mg total) by mouth daily. 90 tablet 0  . TURMERIC PO Take 750 mg by mouth daily.    Marland Kitchen Ubiquinol 100 MG CAPS Take 100 mg by mouth daily.     Facility-Administered Medications Prior to Visit  Medication Dose Route Frequency Provider Last Rate Last Dose  . gadopentetate dimeglumine (MAGNEVIST) injection 15 mL  15 mL Intravenous Once PRN Melvenia Beam, MD        PAST MEDICAL HISTORY: Past Medical History:  Diagnosis Date  . Anxiety   . Arthritis    C5-6- bone spurs- treated by Chiropratic , achiness in hips & legs   . Breast cancer ([place])   . Depression   . GERD (gastroesophageal reflux disease)   . Hot flashes   . Hyperlipidemia   . Hypertension    stress test 5 yrs. ago or > , states it was wnl, no need for cardiac f/u, states her BP is managed by Dr. Wilson Singer  . OSA on CPAP     PAST SURGICAL HISTORY: Past Surgical History:  Procedure Laterality Date  . APPENDECTOMY     as a teen   . blepheroplasy     bilateral   . BREAST RECONSTRUCTION  04/09/13  . BREAST RECONSTRUCTION WITH PLACEMENT OF TISSUE EXPANDER AND FLEX HD (ACELLULAR HYDRATED DERMIS) Bilateral 10/11/2012   Procedure: BREAST RECONSTRUCTION WITH PLACEMENT OF TISSUE EXPANDER AND FLEX HD (ACELLULAR HYDRATED DERMIS);  Surgeon: Crissie Reese, MD;  Location: [facility];  Service: Plastics;  Laterality: Bilateral;  . BREAST SURGERY Left 2006   open biopsy (benign), R breast- stereotactic core biopsy-  R breast- 07/2012  . MASTECTOMY W/ SENTINEL NODE BIOPSY Right 10/11/2012   Procedure:  bilateral MASTECTOMY WITH right  SENTINEL LYMPH NODE BIOPSY;  Surgeon: Odis Hollingshead, MD;  Location: [facility];  Service: General;  Laterality: Right;  right nuclear medicine injection 10:45 dr Harlow Mares to follow with reconstruction time alloted   . TOTAL MASTECTOMY Left 10/11/2012   Procedure: TOTAL MASTECTOMY;  Surgeon: Odis Hollingshead, MD;  Location: [facility];  Service: General;  Laterality: Left;  . TUBAL LIGATION      FAMILY HISTORY: Family History  Problem Relation Age of Onset  . Cancer Father  colon mets to lung and brain  . Colon cancer Father   . Heart disease Mother   . Diabetes Mother   . Hypertension Mother     SOCIAL HISTORY: Social History   Socioeconomic History  . Marital status: Married    Spouse name: Not on file  . Number of children: 2  . Years of education: Not on file  . Highest education level: High school graduate  Occupational History    Comment: [employer]  Social Needs  . Financial resource strain: Not on file  . Food insecurity:    Worry: Not on file    Inability: Not on file  . Transportation needs:    Medical: Not on file    Non-medical: Not on file  Tobacco Use  . Smoking status: Never Smoker  . Smokeless tobacco: Never Used  Substance and Sexual Activity  . Alcohol use: Yes    Alcohol/week: 1.2 oz    Types: 2 Glasses of wine per week    Comment: <2 glasses of wine per week   . Drug use: No  . Sexual activity: Not Currently  Lifestyle  . Physical activity:    Days per week: Not on file    Minutes per session: Not on file  . Stress: Not on file  Relationships  . Social connections:    Talks on phone: Not on file    Gets together: Not on file    Attends religious service: Not on file    Active member of club or organization: Not on file    Attends meetings of clubs or organizations: Not on file    Relationship status: Not on file  . Intimate  partner violence:    Fear of current or ex partner: Not on file    Emotionally abused: Not on file    Physically abused: Not on file    Forced sexual activity: Not on file  Other Topics Concern  . Not on file  Social History Narrative   Lives at home with husband   Right handed     PHYSICAL EXAM  Vitals:   11/01/17 0721  BP: 140/88  Pulse: 81  Weight: 190 lb (86.2 kg)  Height: 5\' 7"  (1.702 m)   Body mass index is 29.76 kg/m.  Generalized: Well developed, in no acute distress  Head: normocephalic and atraumatic,. Oropharynx benign mallopatti 3 Neck: Supple, circumference 15 Lungs clear  Musculoskeletal: No deformity  Skin no peripheral edema Neurological examination   Mentation: Alert oriented to time, place, history taking. Attention span and concentration appropriate. Recent and remote memory intact.  Follows all commands speech and language fluent.   Cranial nerve II-XII: Pupils were equal round reactive to light extraocular movements were full, visual field were full on confrontational test. Facial sensation and strength were normal. hearing was intact to finger rubbing bilaterally. Uvula tongue midline. head turning and shoulder shrug were normal and symmetric.Tongue protrusion into cheek strength was normal. Motor: normal bulk and tone, full strength in the BUE, BLE,  Sensory: normal and symmetric to light touch,  Coordination: finger-nose-finger, heel-to-shin bilaterally, no dysmetria Gait and Station: Rising up from seated position without assistance, normal stance,  moderate stride, good arm swing, smooth turning, able to perform tiptoe, and heel walking without difficulty. Tandem gait is steady  DIAGNOSTIC DATA (LABS, IMAGING, TESTING) - I reviewed patient records, labs, notes, testing and imaging myself where available.  Lab Results  Component Value Date   WBC 4.2 03/25/2014  HGB 13.6 03/25/2014   HCT 42.0 03/25/2014   MCV 92.6 03/25/2014   PLT 221  03/25/2014      Component Value Date/Time   NA 146 (H) 05/12/2017 0915   NA 143 03/25/2014 1033   K 3.9 05/12/2017 0915   K 4.1 03/25/2014 1033   CL 107 (H) 05/12/2017 0915   CO2 22 05/12/2017 0915   CO2 26 03/25/2014 1033   GLUCOSE 93 05/12/2017 0915   GLUCOSE 84 03/25/2014 1033   BUN 16 05/12/2017 0915   BUN 16.5 03/25/2014 1033   CREATININE 1.00 05/12/2017 0915   CREATININE 1.0 03/25/2014 1033   CALCIUM 10.0 05/12/2017 0915   CALCIUM 9.5 03/25/2014 1033   PROT 6.6 03/25/2014 1033   ALBUMIN 3.6 03/25/2014 1033   AST 15 03/25/2014 1033   ALT 13 03/25/2014 1033   ALKPHOS 39 (L) 03/25/2014 1033   BILITOT 0.22 03/25/2014 1033   GFRNONAA 60 05/12/2017 0915   GFRAA 69 05/12/2017 0915    Lab Results  Component Value Date   TSH 3.570 05/12/2017      ASSESSMENT AND PLAN  62 y.o. year old female here to follow-up for newly diagnosed obstructive sleep apnea with initial CPAP compliance.CPAP data dated 10/01/2017-10/30/2017 shows average usage 7 hours 22 minutes.  Set pressure 5 to 14 cm.  EPR level 3.  Leaks 95th percentile at 12.  AHI 2.9.  She was in [place] for a week and did not take her equipment.     CPAP compliance 73% Continue same settings Follow-up in 6 months Sherry Stafford, [facility], [facility], APRN  [facility] [address] [place], [place] [address] [phone]

## 2017-11-01 ENCOUNTER — Encounter: Payer: Self-pay | Admitting: Nurse Practitioner

## 2017-11-01 ENCOUNTER — Ambulatory Visit (INDEPENDENT_AMBULATORY_CARE_PROVIDER_SITE_OTHER): Payer: BLUE CROSS/BLUE SHIELD | Admitting: Nurse Practitioner

## 2017-11-01 DIAGNOSIS — Z9989 Dependence on other enabling machines and devices: Secondary | ICD-10-CM | POA: Diagnosis not present

## 2017-11-01 DIAGNOSIS — G4733 Obstructive sleep apnea (adult) (pediatric): Secondary | ICD-10-CM | POA: Diagnosis not present

## 2017-11-01 NOTE — Patient Instructions (Signed)
CPAP compliance 73% Continue same settings Follow-up in 6 months

## 2017-12-18 ENCOUNTER — Telehealth: Payer: Self-pay | Admitting: Hematology and Oncology

## 2017-12-18 ENCOUNTER — Inpatient Hospital Stay: Payer: BLUE CROSS/BLUE SHIELD | Attending: Hematology and Oncology | Admitting: Hematology and Oncology

## 2017-12-18 DIAGNOSIS — Z170 Estrogen receptor positive status [ER+]: Secondary | ICD-10-CM

## 2017-12-18 DIAGNOSIS — Z79810 Long term (current) use of selective estrogen receptor modulators (SERMs): Secondary | ICD-10-CM | POA: Diagnosis not present

## 2017-12-18 DIAGNOSIS — Z9013 Acquired absence of bilateral breasts and nipples: Secondary | ICD-10-CM | POA: Insufficient documentation

## 2017-12-18 DIAGNOSIS — C50511 Malignant neoplasm of lower-outer quadrant of right female breast: Secondary | ICD-10-CM

## 2017-12-18 NOTE — Progress Notes (Signed)
Patient Care Team: Merrilee Seashore, MD as PCP - General (Internal Medicine)  DIAGNOSIS:  Encounter Diagnosis  Name Primary?  . Malignant neoplasm of lower-outer quadrant of right breast of female, estrogen receptor positive ([place])     SUMMARY OF ONCOLOGIC HISTORY:   Breast cancer of lower-outer quadrant of right female breast ([place])   07/31/2012 Initial Diagnosis    Right breast biopsy: 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 Tumor was ER positive PR positive HER-2/neu negative with Ki-67 5%    08/07/2012 Breast MRI    right breast 1.3 x 0.9 x 1.6 cm irregular enhancing spiculated mass in the middle third of the lateral aspect of the right breast . At 12:00 in the anterior third of the right breast was another enhancing mass measuring 7 x 6 x 5 mm    10/11/2012 Surgery    Bilateral mastectomies: T1 N0 M0 stage IA right breast cancer    11/23/2012 -  Anti-estrogen oral therapy    Tamoxifen 20 mg daily switched to anastrozole 04/27/2015 due to excessive vaginal discharge, switched back to tamoxifen because of myalgias     CHIEF COMPLIANT: Follow-up on tamoxifen therapy  INTERVAL HISTORY: Sherry Stafford is a 62 year old with above-mentioned history of right breast cancer treated with bilateral mastectomies and is currently on tamoxifen.  She appears to be tolerating tamoxifen fairly well.  She continues to have the same issues related to vaginal discharge as well as hot flashes.  She complains of fullness in the right axilla  REVIEW OF SYSTEMS:   Constitutional: Denies fevers, chills or abnormal weight loss Eyes: Denies blurriness of vision Ears, nose, mouth, throat, and face: Denies mucositis or sore throat Respiratory: Denies cough, dyspnea or wheezes Cardiovascular: Denies palpitation, chest discomfort Gastrointestinal:  Denies nausea, heartburn or change in bowel habits Skin: Denies abnormal skin rashes Lymphatics: Denies new lymphadenopathy or easy  bruising Neurological:Denies numbness, tingling or new weaknesses Behavioral/Psych: Mood is stable, no new changes  Extremities: No lower extremity edema Breast: Denies any lumps or nodules in the chest wall.  Complains of fullness in the right axilla. All other systems were reviewed with the patient and are negative.  I have reviewed the past medical history, past surgical history, social history and family history with the patient and they are unchanged from previous note.  ALLERGIES:  is allergic to cefuroxime axetil.  MEDICATIONS:  Current Outpatient Medications  Medication Sig Dispense Refill  . amLODipine (NORVASC) 5 MG tablet Take 5 mg by mouth daily with breakfast.     . buPROPion (WELLBUTRIN XL) 150 MG 24 hr tablet Take 150 mg by mouth daily with breakfast.     . CALCIUM PO Take 0.5 tablets by mouth daily.    . fluticasone (FLONASE) 50 MCG/ACT nasal spray Place 2 sprays into the nose daily as needed for allergies.     Marland Kitchen LORazepam (ATIVAN) 0.5 MG tablet Take 0.5 mg by mouth daily as needed.     Marland Kitchen LOVAZA 1 G capsule     . meclizine (ANTIVERT) 25 MG tablet Take 25 mg by mouth daily as needed for dizziness.    . methocarbamol (ROBAXIN) 500 MG tablet Take 1 tablet (500 mg total) by mouth every 6 (six) hours as needed. 40 tablet 1  . omeprazole (PRILOSEC) 40 MG capsule Take 40 mg by mouth as needed.     . rosuvastatin (CRESTOR) 5 MG tablet Take 5 mg by mouth daily with breakfast.     . tamoxifen (  NOLVADEX) 20 MG tablet Take 1 tablet (20 mg total) by mouth daily. 90 tablet 0  . TURMERIC PO Take 750 mg by mouth daily.    Marland Kitchen Ubiquinol 100 MG CAPS Take 100 mg by mouth daily.     No current facility-administered medications for this visit.    Facility-Administered Medications Ordered in Other Visits  Medication Dose Route Frequency Provider Last Rate Last Dose  . gadopentetate dimeglumine (MAGNEVIST) injection 15 mL  15 mL Intravenous Once PRN Melvenia Beam, MD        PHYSICAL  EXAMINATION: ECOG PERFORMANCE STATUS: 1 - Symptomatic but completely ambulatory  Vitals:   12/18/17 0827  BP: 137/85  Pulse: 88  Resp: 18  Temp: 98.5 F (36.9 C)  SpO2: 98%   Filed Weights   12/18/17 0827  Weight: 185 lb 4.8 oz (84.1 kg)    GENERAL:alert, no distress and comfortable SKIN: skin color, texture, turgor are normal, no rashes or significant lesions EYES: normal, Conjunctiva are pink and non-injected, sclera clear OROPHARYNX:no exudate, no erythema and lips, buccal mucosa, and tongue normal  NECK: supple, thyroid normal size, non-tender, without nodularity LYMPH:  no palpable lymphadenopathy in the cervical, axillary or inguinal LUNGS: clear to auscultation and percussion with normal breathing effort HEART: regular rate & rhythm and no murmurs and no lower extremity edema ABDOMEN:abdomen soft, non-tender and normal bowel sounds MUSCULOSKELETAL:no cyanosis of digits and no clubbing  NEURO: alert & oriented x 3 with fluent speech, no focal motor/sensory deficits EXTREMITIES: No lower extremity edema BREAST: No palpable masses or nodules in either right or left reconstructed breast or axilla. No palpable axillary supraclavicular or infraclavicular adenopathy no breast tenderness or nipple discharge. (exam performed in the presence of a chaperone)  LABORATORY DATA:  I have reviewed the data as listed CMP Latest Ref Rng & Units 05/12/2017 03/25/2014 09/17/2013  Glucose 65 - 99 mg/dL 93 84 88  BUN 8 - 27 mg/dL 16 16.5 20.1  Creatinine 0.57 - 1.00 mg/dL 1.00 1.0 0.9  Sodium 134 - 144 mmol/L 146(H) 143 142  Potassium 3.5 - 5.2 mmol/L 3.9 4.1 4.0  Chloride 96 - 106 mmol/L 107(H) - -  CO2 20 - 29 mmol/L 22 26 22   Calcium 8.7 - 10.3 mg/dL 10.0 9.5 9.7  Total Protein 6.4 - 8.3 g/dL - 6.6 7.1  Total Bilirubin 0.20 - 1.20 mg/dL - 0.22 <0.20  Alkaline Phos 40 - 150 U/L - 39(L) 46  AST 5 - 34 U/L - 15 15  ALT 0 - 55 U/L - 13 17    Lab Results  Component Value Date   WBC  4.2 03/25/2014   HGB 13.6 03/25/2014   HCT 42.0 03/25/2014   MCV 92.6 03/25/2014   PLT 221 03/25/2014   NEUTROABS 2.2 03/25/2014    ASSESSMENT & PLAN:  Breast cancer of lower-outer quadrant of right female breast ([place]) Right breast grade 1 invasive ductal carcinoma with associated DCIS ER/PR positive HER-2 negative Ki-67 5% status post bilateral mastectomies T1 N0 M0 stage IA Tamoxifen 20 mg daily since August 2014 to 04/27/2015, switched to anastrozole because of vaginal discharge from 04/27/2015 Switched back to tamoxifen  Tamoxifen toxicities: 1. Musculoskeletal aches and pains: Stable 2. Intermittent hot flashes 3. Profound vaginal discharge:  Much improved 4. Brittle nails: Encouraged her to take Biotene  Breast Cancer Surveillance: 1. Breast exam  12/18/2017: Bilateral breast reconstructions were intact and no palpable lumps or nodules in the chest wall or axilla 2. Mammogram: Since  she had bilateral mastectomies there is no role of mammograms.  Right axilla tightness: I will refer her to physical therapy  We will perform breast cancer index to decide if she needs to stay on tamoxifen longer than 5 years. Return to clinic in 1 year for follow-up  Orders Placed This Encounter  Procedures  . Ambulatory referral to Physical Therapy    Referral Priority:   Routine    Referral Type:   Physical Medicine    Referral Reason:   Specialty Services Required    Requested Specialty:   Physical Therapy    Number of Visits Requested:   1   The patient has a good understanding of the overall plan. she agrees with it. she will call with any problems that may develop before the next visit here.   Harriette Ohara, MD 12/18/17

## 2017-12-18 NOTE — Assessment & Plan Note (Signed)
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 Musculoskeletal aches and pains: Stable 2. Intermittent hot flashes 3. Profound vaginal discharge: Continues to be a challenge 4. Brittle nails: Encouraged her to take Biotene  Breast Cancer Surveillance: 1. Breast exam  12/18/2017: Bilateral breast reconstructions were intact and no palpable lumps or nodules in the chest wall or axilla 2. Mammogram: Since she had bilateral mastectomies there is no role of mammograms.  We will perform breast cancer index to decide if she needs to stay on tamoxifen longer than 5 years.

## 2017-12-18 NOTE — Telephone Encounter (Signed)
Pt declined avs and calendar  °

## 2017-12-22 ENCOUNTER — Ambulatory Visit: Payer: BLUE CROSS/BLUE SHIELD | Attending: Hematology and Oncology | Admitting: Physical Therapy

## 2017-12-22 DIAGNOSIS — R29898 Other symptoms and signs involving the musculoskeletal system: Secondary | ICD-10-CM | POA: Diagnosis present

## 2017-12-22 DIAGNOSIS — R293 Abnormal posture: Secondary | ICD-10-CM

## 2017-12-22 DIAGNOSIS — R2231 Localized swelling, mass and lump, right upper limb: Secondary | ICD-10-CM | POA: Diagnosis present

## 2017-12-22 NOTE — Therapy (Signed)
[facility] [place], [place], [address] Phone: [phone]   Fax:  [phone]  Physical Therapy Evaluation  Patient Details  Name: Sherry Stafford MRN: [id number] Date of Birth: 11/23/1955 Referring Provider (PT): Dr. Lindi Adie   Encounter Date: 12/22/2017  PT End of Session - 12/22/17 1207    Visit Number  1    Number of Visits  9    Date for PT Re-Evaluation  01/25/18    PT Start Time  1100    PT Stop Time  1145    PT Time Calculation (min)  45 min    Activity Tolerance  Patient tolerated treatment well    Behavior During Therapy  [facility] for tasks assessed/performed       Past Medical History:  Diagnosis Date  . Anxiety   . Arthritis    C5-6- bone spurs- treated by Chiropratic , achiness in hips & legs   . Breast cancer ([place])   . Depression   . GERD (gastroesophageal reflux disease)   . Hot flashes   . Hyperlipidemia   . Hypertension    stress test 5 yrs. ago or > , states it was wnl, no need for cardiac f/u, states her BP is managed by Dr. Wilson Singer  . OSA on CPAP     Past Surgical History:  Procedure Laterality Date  . APPENDECTOMY     as a teen   . blepheroplasy     bilateral   . BREAST RECONSTRUCTION  04/09/13  . BREAST RECONSTRUCTION WITH PLACEMENT OF TISSUE EXPANDER AND FLEX HD (ACELLULAR HYDRATED DERMIS) Bilateral 10/11/2012   Procedure: BREAST RECONSTRUCTION WITH PLACEMENT OF TISSUE EXPANDER AND FLEX HD (ACELLULAR HYDRATED DERMIS);  Surgeon: Crissie Reese, MD;  Location: [facility];  Service: Plastics;  Laterality: Bilateral;  . BREAST SURGERY Left 2006   open biopsy (benign), R breast- stereotactic core biopsy- R breast- 07/2012  . MASTECTOMY W/ SENTINEL NODE BIOPSY Right 10/11/2012   Procedure:  bilateral MASTECTOMY WITH right  SENTINEL LYMPH NODE BIOPSY;  Surgeon: Odis Hollingshead, MD;  Location: [facility];  Service: General;  Laterality: Right;  right nuclear medicine injection 10:45 dr Harlow Mares to follow  with reconstruction time alloted   . TOTAL MASTECTOMY Left 10/11/2012   Procedure: TOTAL MASTECTOMY;  Surgeon: Odis Hollingshead, MD;  Location: [facility];  Service: General;  Laterality: Left;  . TUBAL LIGATION      There were no vitals filed for this visit.   Subjective Assessment - 12/22/17 1100    Subjective  Tightness in right axilla . She also says she have noticed that her hand writing is " more jagged " .  She has occasional pain in the shoulder at night     Pertinent History  Right breast cancer diagnosed 07/31/2012 with bilateral mastectomies 10/11/2012 with 2 nodes removed.   did not have to have chemo or radiation .  History includes compessed cervical vertibrae that included tingling and numbness.  Had chirpracator care that helped about a year ago     Patient Stated Goals  to get some relief from her axillary pain          [facility] PT Assessment - 12/22/17 0001      Assessment   Medical Diagnosis  right breast cancer     Referring Provider (PT)  Dr. Lindi Adie    Onset Date/Surgical Date  07/31/12    Hand Dominance  Right      Restrictions   Weight  Bearing Restrictions  No      Balance Screen   Has the patient fallen in the past 6 months  No    Has the patient had a decrease in activity level because of a fear of falling?   No    Is the patient reluctant to leave their home because of a fear of falling?   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       Cognition   Overall Cognitive Status  Within Functional Limits for tasks assessed       Observation/Other Assessments   Observations  well healed incisions in right axilla and breast from reconstruction    Skin Integrity  no open areas    Quick DASH   11.36      Sensation   Light Touch  Not tested      Coordination   Gross Motor Movements are Fluid and Coordinated  Yes      Posture/Postural Control   Posture/Postural Control  Postural limitations    Postural Limitations  Rounded Shoulders;Forward head      ROM / Strength   AROM / PROM / Strength  AROM;Strength      AROM   Right Shoulder Flexion  150 Degrees    Right Shoulder ABduction  170 Degrees    Left Shoulder Flexion  160 Degrees    Left Shoulder ABduction  175 Degrees    Cervical - Right Side Bend  25    Cervical - Left Side Bend  30    Cervical - Right Rotation  65    Cervical - Left Rotation  75      Strength   Overall Strength  Within functional limits for tasks performed      Palpation   Palpation comment  firm fullness in right axilla  that is not tender to palpation  Pt also has firmness in right upper trap and cervical area         LYMPHEDEMA/ONCOLOGY QUESTIONNAIRE - 12/22/17 1138      Right Upper Extremity Lymphedema   15 cm Proximal to Olecranon Process  32.5 cm    Olecranon Process  26.5 cm    15 cm Proximal to Ulnar Styloid Process  26 cm    Just Proximal to Ulnar Styloid Process  16.7 cm    Across Hand at [employer]  20.4 cm    At [place] of 2nd Digit  6 cm      Left Upper Extremity Lymphedema   15 cm Proximal to Olecranon Process  33 cm    Olecranon Process  27 cm    15 cm Proximal to Ulnar Styloid Process  25.7 cm    Just Proximal to Ulnar Styloid Process  16.5 cm    Across Hand at [employer]  20.5 cm    At [place] of 2nd Digit  6 cm          Quick  Dash - 12/22/17 0001    Open a tight or new jar  No difficulty    Do heavy household chores (wash walls, wash floors)  No difficulty    Carry a shopping bag or briefcase  No difficulty    Wash your back  No difficulty     Use a knife to cut food  No difficulty    Recreational activities in which you take some force or impact through your arm, shoulder, or hand (golf, hammering, tennis)  Mild difficulty    During the past week, to what extent has your arm, shoulder or hand problem interfered with your normal social activities with family, friends, neighbors, or groups?  Slightly    During the past week, to what extent has your arm, shoulder or hand problem limited your work or other regular daily activities  Slightly    Arm, shoulder, or hand pain.  Mild    Tingling (pins and needles) in your arm, shoulder, or hand  Mild    Difficulty Sleeping  No difficulty    DASH Score  11.36 %        Objective measurements completed on examination: See above findings.                   PT Long Term Goals - 12/22/17 1218      PT LONG TERM GOAL #1   Title  Pt will report that her symptoms of fullness in her right axilla are decreased by 50%     Time  4    Period  Weeks    Status  New      PT LONG TERM GOAL #2   Title  Pt will increase lateral cerivcal flexion to 40 degrees to each side     Time  4    Period  Weeks    Status  New      PT LONG TERM GOAL #3   Title  Pt will be independent in a home program for postural, scapular and general strengthening    Time  4    Period  Weeks    Status  New             Plan - 12/22/17 1208    Clinical Impression Statement  62 yo female is 5 years post bilateral mastecomty and reconstruction who is having tightness and fullness in right axilla. She has decreased end range of motion in her right shoulder and neck especially with lateral flexion.  She works at a computer most of the time and would benefit from improvment in her posture     History and Personal Factors relevant to plan of care:  previous sentinel node disseciton, bilateral mastectomy and reconstruction     Clinical Presentation  Stable    Clinical Presentation due to:  no more cancer  treatment     Clinical Decision Making  Low    Rehab Potential  Good    PT Frequency  2x / week    PT Duration  4 weeks    PT Treatment/Interventions  ADLs/Self Care Home Management;Therapeutic activities;Therapeutic exercise;Neuromuscular re-education;Manual techniques;Passive range of motion;Scar mobilization;Manual lymph drainage    PT Next Visit Plan  teach Meeks decompresson exericses, neck retraction, soft tissue work to neck and upper shoulder, manual techniques to tigtness in right axilla, prograss to advanced scapular work and axillary and postural stretching.  Teach scapular strenghtening and strength ABC program    Consulted and Agree with Plan of Care  Patient       Patient will benefit from skilled therapeutic intervention in order to improve the following deficits and impairments:  Decreased knowledge of use of DME, Increased fascial restricitons, Postural dysfunction, Decreased coordination, Decreased range of motion, Impaired perceived functional ability, Pain  Visit Diagnosis: Abnormal posture - Plan: PT plan of care cert/re-cert  Other symptoms and signs involving the musculoskeletal system - Plan: PT plan of care cert/re-cert  Localized swelling, mass and lump, right upper limb - Plan: PT plan of care cert/re-cert     Problem List Patient Active Problem List   Diagnosis Date Noted  . Obstructive sleep apnea treated with continuous positive airway pressure (CPAP) 11/01/2017  . RLS (restless legs syndrome) 06/14/2017  . Excessive daytime sleepiness 06/14/2017  . Snoring 06/14/2017  . Vertigo 05/15/2017  . Post-menopausal 04/21/2015  . Axillary mass-right 11/18/2013  . Spontaneous pneumothorax-right 10/15/2012  . Breast cancer of lower-outer quadrant of right female breast ([place]) 08/08/2012   Donato Heinz. Owens Shark PT  Norwood Levo 12/22/2017, 12:21 PM  [place], [place],  [address] Phone: [phone]   Fax:  [phone]  Name: BRIANA FARNER MRN: [id number] Date of Birth: [birth year]

## 2018-01-03 ENCOUNTER — Ambulatory Visit: Payer: BLUE CROSS/BLUE SHIELD | Attending: Hematology and Oncology

## 2018-01-03 DIAGNOSIS — R293 Abnormal posture: Secondary | ICD-10-CM

## 2018-01-03 DIAGNOSIS — R2231 Localized swelling, mass and lump, right upper limb: Secondary | ICD-10-CM | POA: Diagnosis present

## 2018-01-03 DIAGNOSIS — R29898 Other symptoms and signs involving the musculoskeletal system: Secondary | ICD-10-CM | POA: Insufficient documentation

## 2018-01-03 NOTE — Therapy (Signed)
[place], [place], [address] Phone: [phone]   Fax:  [phone]  Physical Therapy Treatment  Patient Details  Name: Sherry Stafford MRN: [id number] Date of Birth: [birth year] Referring Provider (PT): Dr. Lindi Adie   Encounter Date: 01/03/2018  PT End of Session - 01/03/18 1241    Visit Number  2    Number of Visits  9    Date for PT Re-Evaluation  01/25/18    PT Start Time  1111    PT Stop Time  1152    PT Time Calculation (min)  41 min    Activity Tolerance  Patient tolerated treatment well    Behavior During Therapy  [facility] for tasks assessed/performed       Past Medical History:  Diagnosis Date  . Anxiety   . Arthritis    C5-6- bone spurs- treated by Chiropratic , achiness in hips & legs   . Breast cancer ([place])   . Depression   . GERD (gastroesophageal reflux disease)   . Hot flashes   . Hyperlipidemia   . Hypertension    stress test 5 yrs. ago or > , states it was wnl, no need for cardiac f/u, states her BP is managed by Dr. Wilson Singer  . OSA on CPAP     Past Surgical History:  Procedure Laterality Date  . APPENDECTOMY     as a teen   . blepheroplasy     bilateral   . BREAST RECONSTRUCTION  04/09/13  . BREAST RECONSTRUCTION WITH PLACEMENT OF TISSUE EXPANDER AND FLEX HD (ACELLULAR HYDRATED DERMIS) Bilateral 10/11/2012   Procedure: BREAST RECONSTRUCTION WITH PLACEMENT OF TISSUE EXPANDER AND FLEX HD (ACELLULAR HYDRATED DERMIS);  Surgeon: Crissie Reese, MD;  Location: [place];  Service: Plastics;  Laterality: Bilateral;  . BREAST SURGERY Left 2006   open biopsy (benign), R breast- stereotactic core biopsy- R breast- 07/2012  . MASTECTOMY W/ SENTINEL NODE BIOPSY Right 10/11/2012   Procedure:  bilateral MASTECTOMY WITH right  SENTINEL LYMPH NODE BIOPSY;  Surgeon: Odis Hollingshead, MD;  Location: [place];  Service: General;  Laterality: Right;  right nuclear medicine injection 10:45 dr Harlow Mares to follow with  reconstruction time alloted   . TOTAL MASTECTOMY Left 10/11/2012   Procedure: TOTAL MASTECTOMY;  Surgeon: Odis Hollingshead, MD;  Location: [place];  Service: General;  Laterality: Left;  . TUBAL LIGATION      There were no vitals filed for this visit.  Subjective Assessment - 01/03/18 1144    Subjective  I do Pilates in [place] at least 1x/wk and had it last night and she did lymphatic drainage on my with 2 wands. I don't know what it's called but it seemed to help reduce the swelling at my Rt axilla.     Pertinent History  Right breast cancer diagnosed 07/31/2012 with bilateral mastectomies 10/11/2012 with 2 nodes removed.   did not have to have chemo or radiation .  History includes compessed cervical vertibrae that included tingling and numbness.  Had chirpracator care that helped about a year ago     Patient Stated Goals  to get some relief from her axillary pain     Currently in Pain?  No/denies                       [facility] Adult PT Treatment/Exercise - 01/03/18 0001      Shoulder Exercises: Supine   Horizontal ABduction  Strengthening;Both;10 reps;Theraband  External Rotation  Strengthening;Both;10 reps;Theraband    Theraband Level (Shoulder External Rotation)  Level 2 (Red)    External Rotation Limitations  Demonstration and VCs for correct scapular retraction    Flexion  Strengthening;Both;10 reps;Theraband   Narrow and Wide Grip, 10 times each   Theraband Level (Shoulder Flexion)  Level 2 (Red)    Diagonals  Strengthening;Right;Left;5 reps;Theraband    Theraband Level (Shoulder Diagonals)  Level 2 (Red)      Manual Therapy   Manual Therapy  Myofascial release;Manual Lymphatic Drainage (MLD);Passive ROM;Soft tissue mobilization    Soft tissue mobilization  Briefly with lotion to Rt upper trap where pt c/o tightness    Myofascial Release  To Rt axilla and lateral trunk during P/ROM; crosshands technique horizontally and diagonally at Rt chest wall; also UE pulling  throughout    Manual Lymphatic Drainage (MLD)  In Supine: Short neck, superfical and deep abdominals, Rt inguinal and Lt axillary nodes, Rt axillo-inguinal and anterior inter-axillary anastomosis then focused on Rt lateral trunk and axilla.    Passive ROM  In Supine to Rt shoulder into end ROM flexion, abduction and D2 pattern              PT Education - 01/03/18 1240    Education Details  Supine scapular series    Person(s) Educated  Patient    Methods  Explanation;Demonstration;Handout    Comprehension  Verbalized understanding;Returned demonstration;Need further instruction          PT Long Term Goals - 12/22/17 1218      PT LONG TERM GOAL #1   Title  Pt will report that her symptoms of fullness in her right axilla are decreased by 50%     Time  4    Period  Weeks    Status  New      PT LONG TERM GOAL #2   Title  Pt will increase lateral cerivcal flexion to 40 degrees to each side     Time  4    Period  Weeks    Status  New      PT LONG TERM GOAL #3   Title  Pt will be independent in a home program for postural, scapular and general strengthening    Time  4    Period  Weeks    Status  New            Plan - 01/03/18 1242    Clinical Impression Statement  Pt tolerated first session of manual therapy focusing on end ROM, myofascial release and manual lymph drainage very well. Also added supine scapular series to her HEP as hse reports noting her posture very poor with sitting at computer all day. She tolerated this well but will need further review to assess her technique.    Rehab Potential  Good    PT Frequency  2x / week    PT Duration  4 weeks    PT Treatment/Interventions  ADLs/Self Care Home Management;Therapeutic activities;Therapeutic exercise;Neuromuscular re-education;Manual techniques;Passive range of motion;Scar mobilization;Manual lymph drainage    PT Next Visit Plan  teach Meeks decompresson exericses, neck retraction, soft tissue work to neck and  upper shoulder, manual techniques to tigtness in right axilla, progress to advanced scapular work and axillary and postural stretching.  Teach scapular strenghtening and strength ABC program    Consulted and Agree with Plan of Care  Patient       Patient will benefit from skilled therapeutic intervention in order to improve the  following deficits and impairments:  Decreased knowledge of use of DME, Increased fascial restricitons, Postural dysfunction, Decreased coordination, Decreased range of motion, Impaired perceived functional ability, Pain  Visit Diagnosis: Abnormal posture  Other symptoms and signs involving the musculoskeletal system  Localized swelling, mass and lump, right upper limb     Problem List Patient Active Problem List   Diagnosis Date Noted  . Obstructive sleep apnea treated with continuous positive airway pressure (CPAP) 11/01/2017  . RLS (restless legs syndrome) 06/14/2017  . Excessive daytime sleepiness 06/14/2017  . Snoring 06/14/2017  . Vertigo 05/15/2017  . Post-menopausal 04/21/2015  . Axillary mass-right 11/18/2013  . Spontaneous pneumothorax-right 10/15/2012  . Breast cancer of lower-outer quadrant of right female breast ([place]) 08/08/2012    Otelia Limes, PTA 01/03/2018, 12:47 PM  [place], [place], [address] Phone: [phone]   Fax:  [phone]  Name: SKIE VITRANO MRN: [id number] Date of Birth: [birth year]

## 2018-01-03 NOTE — Patient Instructions (Signed)

## 2018-01-05 ENCOUNTER — Ambulatory Visit: Payer: BLUE CROSS/BLUE SHIELD | Admitting: Physical Therapy

## 2018-01-05 ENCOUNTER — Other Ambulatory Visit: Payer: Self-pay

## 2018-01-05 ENCOUNTER — Encounter: Payer: Self-pay | Admitting: Physical Therapy

## 2018-01-05 DIAGNOSIS — R29898 Other symptoms and signs involving the musculoskeletal system: Secondary | ICD-10-CM

## 2018-01-05 DIAGNOSIS — R293 Abnormal posture: Secondary | ICD-10-CM

## 2018-01-05 DIAGNOSIS — R2231 Localized swelling, mass and lump, right upper limb: Secondary | ICD-10-CM

## 2018-01-05 NOTE — Patient Instructions (Signed)

## 2018-01-05 NOTE — Therapy (Signed)
[place], [place], [address] Phone: [phone]   Fax:  [phone]  Physical Therapy Treatment  Patient Details  Name: Sherry Stafford MRN: [id number] Date of Birth: 12-24-1955 Referring Provider (PT): Dr. Lindi Adie   Encounter Date: 01/05/2018  PT End of Session - 01/05/18 1152    Visit Number  3    Number of Visits  9    Date for PT Re-Evaluation  01/25/18    PT Start Time  1103    PT Stop Time  1150    PT Time Calculation (min)  47 min    Activity Tolerance  Patient tolerated treatment well    Behavior During Therapy  [facility] for tasks assessed/performed       Past Medical History:  Diagnosis Date  . Anxiety   . Arthritis    C5-6- bone spurs- treated by Chiropratic , achiness in hips & legs   . Breast cancer ([place])   . Depression   . GERD (gastroesophageal reflux disease)   . Hot flashes   . Hyperlipidemia   . Hypertension    stress test 5 yrs. ago or > , states it was wnl, no need for cardiac f/u, states her BP is managed by Dr. Wilson Singer  . OSA on CPAP     Past Surgical History:  Procedure Laterality Date  . APPENDECTOMY     as a teen   . blepheroplasy     bilateral   . BREAST RECONSTRUCTION  04/09/13  . BREAST RECONSTRUCTION WITH PLACEMENT OF TISSUE EXPANDER AND FLEX HD (ACELLULAR HYDRATED DERMIS) Bilateral 10/11/2012   Procedure: BREAST RECONSTRUCTION WITH PLACEMENT OF TISSUE EXPANDER AND FLEX HD (ACELLULAR HYDRATED DERMIS);  Surgeon: Crissie Reese, MD;  Location: [place];  Service: Plastics;  Laterality: Bilateral;  . BREAST SURGERY Left 2006   open biopsy (benign), R breast- stereotactic core biopsy- R breast- 07/2012  . MASTECTOMY W/ SENTINEL NODE BIOPSY Right 10/11/2012   Procedure:  bilateral MASTECTOMY WITH right  SENTINEL LYMPH NODE BIOPSY;  Surgeon: Odis Hollingshead, MD;  Location: [place];  Service: General;  Laterality: Right;  right nuclear medicine injection 10:45 dr Harlow Mares to follow  with reconstruction time alloted   . TOTAL MASTECTOMY Left 10/11/2012   Procedure: TOTAL MASTECTOMY;  Surgeon: Odis Hollingshead, MD;  Location: [place];  Service: General;  Laterality: Left;  . TUBAL LIGATION      There were no vitals filed for this visit.  Subjective Assessment - 01/05/18 1105    Subjective  After she worked on me the other day it really helped.     Pertinent History  Right breast cancer diagnosed 07/31/2012 with bilateral mastectomies 10/11/2012 with 2 nodes removed.   did not have to have chemo or radiation .  History includes compessed cervical vertibrae that included tingling and numbness.  Had chirpracator care that helped about a year ago     Patient Stated Goals  to get some relief from her axillary pain     Currently in Pain?  No/denies                       [facility] Adult PT Treatment/Exercise - 01/05/18 0001      Lumbar Exercises: Supine   Other Supine Lumbar Exercises  Instructed pt in supine Meeks decompression exercises- pt held each for 3 sec and performed 5 reps each      Manual Therapy   Manual Therapy  Myofascial  release;Manual Lymphatic Drainage (MLD);Passive ROM;Soft tissue mobilization    Soft tissue mobilization  to right and left subscap trigger point where there is pain using biotone    Myofascial Release  To Rt axilla and lateral trunk during P/ROM; crosshands technique horizontally and diagonally at Rt chest wall; also UE pulling throughout and cross hands technique to area just inferior to implants with resistance just distal to axilla    Manual Lymphatic Drainage (MLD)  In Supine: Short neck, superfical and deep abdominals, Rt inguinal and Lt axillary nodes, Rt axillo-inguinal and anterior inter-axillary anastomosis then focused on Rt lateral trunk and axilla.    Passive ROM  In Supine to Rt and left shoulder into end ROM flexion, abduction and D2 pattern                   PT Long Term Goals - 12/22/17 1218      PT LONG  TERM GOAL #1   Title  Pt will report that her symptoms of fullness in her right axilla are decreased by 50%     Time  4    Period  Weeks    Status  New      PT LONG TERM GOAL #2   Title  Pt will increase lateral cerivcal flexion to 40 degrees to each side     Time  4    Period  Weeks    Status  New      PT LONG TERM GOAL #3   Title  Pt will be independent in a home program for postural, scapular and general strengthening    Time  4    Period  Weeks    Status  New            Plan - 01/05/18 1153    Clinical Impression Statement  Performed manual therapy to bilateral axilla and trigger points in subscap and pt reported feeling tightness in these areas. Instructed pt in meeks decompression exercises for neck pain. She reports she felt better after last session. Also continued with MLD today since this seemed to help decrease swelling.     Rehab Potential  Good    PT Frequency  2x / week    PT Duration  4 weeks    PT Treatment/Interventions  ADLs/Self Care Home Management;Therapeutic activities;Therapeutic exercise;Neuromuscular re-education;Manual techniques;Passive range of motion;Scar mobilization;Manual lymph drainage    PT Next Visit Plan  assess indep with Meeks decompresson exericses, neck retraction, soft tissue work to neck and upper shoulder, manual techniques to tigtness in right axilla and sub scap, progress to advanced scapular work and axillary and postural stretching.  Teach scapular strenghtening and strength ABC program    Consulted and Agree with Plan of Care  Patient       Patient will benefit from skilled therapeutic intervention in order to improve the following deficits and impairments:  Decreased knowledge of use of DME, Increased fascial restricitons, Postural dysfunction, Decreased coordination, Decreased range of motion, Impaired perceived functional ability, Pain  Visit Diagnosis: Abnormal posture  Other symptoms and signs involving the musculoskeletal  system  Localized swelling, mass and lump, right upper limb     Problem List Patient Active Problem List   Diagnosis Date Noted  . Obstructive sleep apnea treated with continuous positive airway pressure (CPAP) 11/01/2017  . RLS (restless legs syndrome) 06/14/2017  . Excessive daytime sleepiness 06/14/2017  . Snoring 06/14/2017  . Vertigo 05/15/2017  . Post-menopausal 04/21/2015  . Axillary mass-right 11/18/2013  .  Spontaneous pneumothorax-right 10/15/2012  . Breast cancer of lower-outer quadrant of right female breast ([place]) 08/08/2012    Allyson Sabal [facility] 01/05/2018, 12:01 PM  [place], [place], [address] Phone: [phone]   Fax:  [phone]  Name: Sherry Stafford MRN: [id number] Date of Birth: [birth year]  Manus Gunning, PT 01/05/18 12:01 PM

## 2018-01-09 ENCOUNTER — Telehealth: Payer: Self-pay

## 2018-01-09 NOTE — Telephone Encounter (Signed)
Returned patient's call regarding results for the breast cancer index.    Left message with contact information for patient to call back.

## 2018-01-10 ENCOUNTER — Ambulatory Visit: Payer: BLUE CROSS/BLUE SHIELD

## 2018-01-10 DIAGNOSIS — R293 Abnormal posture: Secondary | ICD-10-CM | POA: Diagnosis not present

## 2018-01-10 DIAGNOSIS — R29898 Other symptoms and signs involving the musculoskeletal system: Secondary | ICD-10-CM

## 2018-01-10 DIAGNOSIS — R2231 Localized swelling, mass and lump, right upper limb: Secondary | ICD-10-CM

## 2018-01-10 NOTE — Therapy (Signed)
[place], [place], [address] Phone: [phone]   Fax:  [phone]  Physical Therapy Treatment  Patient Details  Name: Sherry Stafford MRN: [id number] Date of Birth: [birth year] Referring Provider (PT): Dr. Lindi Adie   Encounter Date: 01/10/2018  PT End of Session - 01/10/18 1157    Visit Number  4    Number of Visits  9    Date for PT Re-Evaluation  01/25/18    PT Start Time  1157    PT Stop Time  1148    PT Time Calculation (min)  50 min    Activity Tolerance  Patient tolerated treatment well    Behavior During Therapy  [facility] for tasks assessed/performed       Past Medical History:  Diagnosis Date  . Anxiety   . Arthritis    C5-6- bone spurs- treated by Chiropratic , achiness in hips & legs   . Breast cancer ([place])   . Depression   . GERD (gastroesophageal reflux disease)   . Hot flashes   . Hyperlipidemia   . Hypertension    stress test 5 yrs. ago or > , states it was wnl, no need for cardiac f/u, states her BP is managed by Dr. Wilson Singer  . OSA on CPAP     Past Surgical History:  Procedure Laterality Date  . APPENDECTOMY     as a teen   . blepheroplasy     bilateral   . BREAST RECONSTRUCTION  04/09/13  . BREAST RECONSTRUCTION WITH PLACEMENT OF TISSUE EXPANDER AND FLEX HD (ACELLULAR HYDRATED DERMIS) Bilateral 10/11/2012   Procedure: BREAST RECONSTRUCTION WITH PLACEMENT OF TISSUE EXPANDER AND FLEX HD (ACELLULAR HYDRATED DERMIS);  Surgeon: Crissie Reese, MD;  Location: [place];  Service: Plastics;  Laterality: Bilateral;  . BREAST SURGERY Left 2006   open biopsy (benign), R breast- stereotactic core biopsy- R breast- 07/2012  . MASTECTOMY W/ SENTINEL NODE BIOPSY Right 10/11/2012   Procedure:  bilateral MASTECTOMY WITH right  SENTINEL LYMPH NODE BIOPSY;  Surgeon: Odis Hollingshead, MD;  Location: [place];  Service: General;  Laterality: Right;  right nuclear medicine injection 10:45 dr Harlow Mares to follow  with reconstruction time alloted   . TOTAL MASTECTOMY Left 10/11/2012   Procedure: TOTAL MASTECTOMY;  Surgeon: Odis Hollingshead, MD;  Location: [place];  Service: General;  Laterality: Left;  . TUBAL LIGATION      There were no vitals filed for this visit.  Subjective Assessment - 01/10/18 1059    Subjective  My shoulders continue to slowly feel better.     Pertinent History  Right breast cancer diagnosed 07/31/2012 with bilateral mastectomies 10/11/2012 with 2 nodes removed.   did not have to have chemo or radiation .  History includes compessed cervical vertibrae that included tingling and numbness.  Had chirpracator care that helped about a year ago     Patient Stated Goals  to get some relief from her axillary pain     Currently in Pain?  No/denies                       [facility] Adult PT Treatment/Exercise - 01/10/18 0001      Shoulder Exercises: Standing   Other Standing Exercises  Bil UE 3 way raises 1 lb, 10 x each way into flexion, scaption and abduction to shoulder height with back against wall and core engaged, and head/shoulders against wall, pt able to return correct demo  Shoulder Exercises: Pulleys   Flexion  2 minutes    Flexion Limitations  VCs throughout to decrease scapular compensation    ABduction  2 minutes    ABduction Limitations  VCs to decrease Rt scapular compensation      Shoulder Exercises: Therapy Ball   Flexion  Both;10 reps   1 lb each wrist; forward lean into end of stretch   Flexion Limitations  Pt returned therapist demonstration and VCs to decrease scauplar compensation    ABduction  Right;Left;5 reps   1 lb each wirst; same side lean into end of stretch   ABduction Limitations  VCs to decrease scapular compensation      Manual Therapy   Manual Therapy  Myofascial release;Manual Lymphatic Drainage (MLD);Passive ROM;Soft tissue mobilization    Soft tissue mobilization  --    Myofascial Release  To bil axillae during P/ROM; also UE  pulling throughout    Manual Lymphatic Drainage (MLD)  In Supine: Short neck, superfical and deep abdominals, Rt inguinal and Lt axillary nodes, Rt axillo-inguinal and anterior inter-axillary anastomosis then focused on Rt lateral trunk and axilla redirecting to pathways    Passive ROM  In Supine to Rt and left shoulder into end ROM flexion, abduction and D2 pattern                   PT Long Term Goals - 12/22/17 1218      PT LONG TERM GOAL #1   Title  Pt will report that her symptoms of fullness in her right axilla are decreased by 50%     Time  4    Period  Weeks    Status  New      PT LONG TERM GOAL #2   Title  Pt will increase lateral cerivcal flexion to 40 degrees to each side     Time  4    Period  Weeks    Status  New      PT LONG TERM GOAL #3   Title  Pt will be independent in a home program for postural, scapular and general strengthening    Time  4    Period  Weeks    Status  New            Plan - 01/10/18 1158    Clinical Impression Statement  Progressed pt to include light weight strengthening in standing today with 1 lb weights and also adding weights to ball roll up wall. She tolerated this well but reports this felt like a good workout. Also continued with manual therapy focusing on end ROM as pt is still tight and limited with this though this has improved since start of care, and MLD for reports of swelling at Rt axillary area. Pt also reports noticing she hasn't felt as tight as of late.     Rehab Potential  Good    PT Frequency  2x / week    PT Duration  4 weeks    PT Treatment/Interventions  ADLs/Self Care Home Management;Therapeutic activities;Therapeutic exercise;Neuromuscular re-education;Manual techniques;Passive range of motion;Scar mobilization;Manual lymph drainage    PT Next Visit Plan  Assess indepen with Meeks prn and continue manual therapy including soft tissue to neck an dupper shoulder prn, also manual techniques to tight Rt axilla  and sub scap; if doing well progress HEP with advanced scapular work and postural stretching (doorway stretch) then [employer].     Consulted and Agree with Plan of Care  Patient  Patient will benefit from skilled therapeutic intervention in order to improve the following deficits and impairments:  Decreased knowledge of use of DME, Increased fascial restricitons, Postural dysfunction, Decreased coordination, Decreased range of motion, Impaired perceived functional ability, Pain  Visit Diagnosis: Abnormal posture  Other symptoms and signs involving the musculoskeletal system  Localized swelling, mass and lump, right upper limb     Problem List Patient Active Problem List   Diagnosis Date Noted  . Obstructive sleep apnea treated with continuous positive airway pressure (CPAP) 11/01/2017  . RLS (restless legs syndrome) 06/14/2017  . Excessive daytime sleepiness 06/14/2017  . Snoring 06/14/2017  . Vertigo 05/15/2017  . Post-menopausal 04/21/2015  . Axillary mass-right 11/18/2013  . Spontaneous pneumothorax-right 10/15/2012  . Breast cancer of lower-outer quadrant of right female breast ([place]) 08/08/2012    Otelia Limes, PTA 01/10/2018, 12:14 PM  [place], [place], [address] Phone: [phone]   Fax:  [phone]  Name: Sherry Stafford MRN: [id number] Date of Birth: 01-18-1956

## 2018-01-11 ENCOUNTER — Telehealth: Payer: Self-pay

## 2018-01-11 NOTE — Telephone Encounter (Signed)
Called to notify pt that BCI was requested and may take 1 week or so for results. Will update pt with more information next week. Pt verbalized understanding and will check back next week.

## 2018-01-12 ENCOUNTER — Ambulatory Visit: Payer: BLUE CROSS/BLUE SHIELD | Admitting: Physical Therapy

## 2018-01-17 ENCOUNTER — Ambulatory Visit: Payer: BLUE CROSS/BLUE SHIELD

## 2018-01-17 DIAGNOSIS — R29898 Other symptoms and signs involving the musculoskeletal system: Secondary | ICD-10-CM

## 2018-01-17 DIAGNOSIS — R293 Abnormal posture: Secondary | ICD-10-CM

## 2018-01-17 DIAGNOSIS — R2231 Localized swelling, mass and lump, right upper limb: Secondary | ICD-10-CM

## 2018-01-17 NOTE — Therapy (Addendum)
[place], [place], [address] Phone: [phone]   Fax:  [phone]  Physical Therapy Treatment  Patient Details  Name: Sherry Stafford MRN: [id number] Date of Birth: 11-17-1955 Referring Provider (PT): Dr. Lindi Adie   Encounter Date: 01/17/2018  PT End of Session - 01/17/18 1439    Visit Number  5    Number of Visits  9    Date for PT Re-Evaluation  01/25/18    PT Start Time  1572    PT Stop Time  1438    PT Time Calculation (min)  51 min    Activity Tolerance  Patient tolerated treatment well    Behavior During Therapy  [employer] for tasks assessed/performed       Past Medical History:  Diagnosis Date  . Anxiety   . Arthritis    C5-6- bone spurs- treated by Chiropratic , achiness in hips & legs   . Breast cancer ([place])   . Depression   . GERD (gastroesophageal reflux disease)   . Hot flashes   . Hyperlipidemia   . Hypertension    stress test 5 yrs. ago or > , states it was wnl, no need for cardiac f/u, states her BP is managed by Dr. Wilson Singer  . OSA on CPAP     Past Surgical History:  Procedure Laterality Date  . APPENDECTOMY     as a teen   . blepheroplasy     bilateral   . BREAST RECONSTRUCTION  04/09/13  . BREAST RECONSTRUCTION WITH PLACEMENT OF TISSUE EXPANDER AND FLEX HD (ACELLULAR HYDRATED DERMIS) Bilateral 10/11/2012   Procedure: BREAST RECONSTRUCTION WITH PLACEMENT OF TISSUE EXPANDER AND FLEX HD (ACELLULAR HYDRATED DERMIS);  Surgeon: Crissie Reese, MD;  Location: [facility];  Service: Plastics;  Laterality: Bilateral;  . BREAST SURGERY Left 2006   open biopsy (benign), R breast- stereotactic core biopsy- R breast- 07/2012  . MASTECTOMY W/ SENTINEL NODE BIOPSY Right 10/11/2012   Procedure:  bilateral MASTECTOMY WITH right  SENTINEL LYMPH NODE BIOPSY;  Surgeon: Odis Hollingshead, MD;  Location: [facility];  Service: General;  Laterality: Right;  right nuclear medicine injection 10:45 dr Harlow Mares to follow  with reconstruction time alloted   . TOTAL MASTECTOMY Left 10/11/2012   Procedure: TOTAL MASTECTOMY;  Surgeon: Odis Hollingshead, MD;  Location: [facility];  Service: General;  Laterality: Left;  . TUBAL LIGATION      There were no vitals filed for this visit.  Subjective Assessment - 01/17/18 1349    Subjective  My shoulders and axillae are feeling better but I still have tightness right under my breasts. I think I want to come 1x/wk.     Pertinent History  Right breast cancer diagnosed 07/31/2012 with bilateral mastectomies 10/11/2012 with 2 nodes removed.   did not have to have chemo or radiation .  History includes compessed cervical vertibrae that included tingling and numbness.  Had chirpracator care that helped about a year ago     Patient Stated Goals  to get some relief from her axillary pain     Currently in Pain?  No/denies         [facility] PT Assessment - 01/17/18 0001      AROM   Cervical - Right Side Bend  35    Cervical - Left Side Bend  32                   [facility] Adult PT Treatment/Exercise - 01/17/18  0001      Exercises   Exercises  Other Exercises    Other Exercises   Began instruction of Strength ABC Program: Progressed through all stretches with pt returning therapist demonstration of each, and also core exercises as well and instructed in reverse curl in lieu of crunch      Shoulder Exercises: Standing   Protraction  Right;Left;10 reps   hands on wall and then alternate raising hand off wall OH   Horizontal ABduction  Right;Left;10 reps   forearms on wall and then thoracic rotation with UEhorz abd   Other Standing Exercises  Wall Push Ups 10x with pt returning therapist demonstration      Manual Therapy   Manual Therapy  Myofascial release;Passive ROM    Myofascial Release  To bil axillae during P/ROM; also UE pulling throughout    Passive ROM  In Supine to Rt and left shoulder into end ROM flexion, abduction and D2 pattern with pt laying on towel roll  along T-spine             PT Education - 01/17/18 1620    Education Details  Strength ABC Program, instructed thru Stretches and core exs and pt reported understanding the rest    Person(s) Educated  Patient    Methods  Explanation;Demonstration;Handout    Comprehension  Verbalized understanding;Returned demonstration          PT Long Term Goals - 01/17/18 1433      PT LONG TERM GOAL #1   Title  Pt will report that her symptoms of fullness in her right axilla are decreased by 50%     Baseline  50% improvement reported with this-01/17/18    Status  Achieved      PT LONG TERM GOAL #2   Title  Pt will increase lateral cerivcal flexion to 40 degrees to each side     Baseline  Rt 35 and Lt 32 degrees- 01/17/18    Status  Partially Met      PT LONG TERM GOAL #3   Title  Pt will be independent in a home program for postural, scapular and general strengthening    Baseline  Issued Strength ABC Program today and pt already independent with earlier HEP-01/17/18    Status  Achieved            Plan - 01/17/18 1440    Clinical Impression Statement  Pt reports feeling much better since start of care. She has met 2/3 goals at this time. Cervical ROM was not met but did improve towards goals and pt feels confident with knowing how to cont cervical stretching for continued progress. Began instruction of Strength ABC Program today and though did not finish instruction, pt reports being comfortable with the exercises as she works out and would like to just D/C to [facility] today. So D/C this visit.     Rehab Potential  Good    PT Frequency  2x / week    PT Duration  4 weeks    PT Treatment/Interventions  ADLs/Self Care Home Management;Therapeutic activities;Therapeutic exercise;Neuromuscular re-education;Manual techniques;Passive range of motion;Scar mobilization;Manual lymph drainage    PT Next Visit Plan  D/C this visit.     Consulted and Agree with Plan of Care  Patient       Patient  will benefit from skilled therapeutic intervention in order to improve the following deficits and impairments:  Decreased knowledge of use of DME, Increased fascial restricitons, Postural dysfunction, Decreased coordination, Decreased range  of motion, Impaired perceived functional ability, Pain  Visit Diagnosis: Abnormal posture  Other symptoms and signs involving the musculoskeletal system  Localized swelling, mass and lump, right upper limb     Problem List Patient Active Problem List   Diagnosis Date Noted  . Obstructive sleep apnea treated with continuous positive airway pressure (CPAP) 11/01/2017  . RLS (restless legs syndrome) 06/14/2017  . Excessive daytime sleepiness 06/14/2017  . Snoring 06/14/2017  . Vertigo 05/15/2017  . Post-menopausal 04/21/2015  . Axillary mass-right 11/18/2013  . Spontaneous pneumothorax-right 10/15/2012  . Breast cancer of lower-outer quadrant of right female breast ([place]) 08/08/2012    Otelia Limes, PTA 01/17/2018, 4:23 PM  [place], [place], [address] Phone: [phone]   Fax:  [phone]  Name: Sherry Stafford MRN: [id number] Date of Birth: 19-Feb-1956  PHYSICAL THERAPY DISCHARGE SUMMARY  Visits from Start of Care: 5  Current functional level related to goals / functional outcomes: As above    Remaining deficits: As above    Education / Equipment: As above  Plan: Patient agrees to discharge.  Patient goals were partially met. Patient is being discharged due to being pleased with the current functional level.  ?????    Maudry Diego, PT 01/30/18 1:08 PM

## 2018-01-19 ENCOUNTER — Ambulatory Visit: Payer: BLUE CROSS/BLUE SHIELD | Admitting: Physical Therapy

## 2018-01-24 ENCOUNTER — Ambulatory Visit: Payer: BLUE CROSS/BLUE SHIELD

## 2018-01-26 ENCOUNTER — Ambulatory Visit: Payer: BLUE CROSS/BLUE SHIELD | Admitting: Physical Therapy

## 2018-01-30 ENCOUNTER — Telehealth: Payer: Self-pay | Admitting: Hematology and Oncology

## 2018-01-30 NOTE — Telephone Encounter (Signed)
I informed the patient that the breast cancer index came back as low risk.  She also has low likelihood of benefit from extended adjuvant therapy.  She does not need to continue tamoxifen any further. Patient is very happy to hear this result.  I will send a copy of this report to her home address.

## 2018-01-31 ENCOUNTER — Encounter: Payer: Self-pay | Admitting: Hematology and Oncology

## 2018-01-31 NOTE — Progress Notes (Signed)
Mailed molecular path report ([facility])  to patient per Dr. Lindi Adie

## 2018-02-01 ENCOUNTER — Encounter (HOSPITAL_COMMUNITY): Payer: Self-pay | Admitting: Hematology and Oncology

## 2018-05-07 ENCOUNTER — Encounter: Payer: Self-pay | Admitting: Nurse Practitioner

## 2018-05-08 ENCOUNTER — Telehealth: Payer: Self-pay | Admitting: Neurology

## 2018-05-08 ENCOUNTER — Telehealth: Payer: Self-pay

## 2018-05-08 ENCOUNTER — Ambulatory Visit: Payer: BLUE CROSS/BLUE SHIELD | Admitting: Neurology

## 2018-05-08 NOTE — Telephone Encounter (Signed)
Called the pt back and advised her that there was an opening tomorrow at 12:45 pm with Cecille Rubin, NP. Patient accepted and understood to check in by 12:15 pm.

## 2018-05-08 NOTE — Progress Notes (Signed)
[facility]  PATIENT: Sherry Stafford DOB: [birth year]   REASON FOR VISIT: Follow-up for obstructive sleep apnea here for  CPAP compliance HISTORY FROM: Patient    HISTORY OF PRESENT ILLNESS:UPDATE 12/20/2020CM Ms. Sherry Stafford, 63 year old female returns for follow-up with history of obstructive sleep apnea here for CPAP compliance.  She has been doing well.  She no longer has daytime drowsiness.  She no longer has headaches.  Compliance data dated 04/08/2018-05/07/2018 shows compliance greater than 4 hours at 100%.  Average usage 6 hours 48 minutes.  Pressure 7 to 14 cm.  EPR level 3.  Leak 95th percentile at 7.  AHI 2.3.  ESS 2.  He returns for reevaluation   UPDATE 8/7/2019CM Ms. Sherry Stafford 63 year old female returns for follow-up with newly diagnosed obstructive sleep apnea, here for initial CPAP.  She is doing well with her CPAP she has been through several mask and likes the mask that she currently has.  CPAP data dated 10/01/2017-10/30/2017 shows average usage 7 hours 22 minutes.  Set pressure 5 to 14 cm.  EPR level 3.  Leaks 95th percentile at 12.  AHI 2.9.  She was in [place] for a week and did not take her equipment.  She returns for reevaluation   06/14/17 CDI have the pleasure of meeting with Mrs. Sherry Stafford today at 63 year old Caucasian right-handed female patient of Dr. Berta Minor Ahern's presenting on 14 June 2017 for a sleep evaluation.  The patient used to be followed by Dr. Wilson Singer for headaches, has a history with a diagnoses of breast cancer, hypertension, vertigo, headaches, and for a while she had low-grade headaches almost daily.  Her headaches were also characterized by an eye pain or ache, and there may have been photophobia as she felt better when she was sunglasses.  The headaches were localized mainly retro-orbital he on the right sometimes pulsating in quality ,sometimes more throbbing sometimes with nausea often with light sensitivity and she reports that sleeping helps.  She  has no family history of migraines.  She has a history of nasal rhinitis with environmental allergies.   Chief complaint according to patient : " My husband reports that I snore' " I wake up choking- on my back "  " I have Restless legs "   Sleep habits are as follows: The patient reports that she has been entrained sleep routine, and when she prepares for sleep she is usually able to fall asleep immediately once she is in bed. She retreats at 8.30- 9.30 PM.  Her bedroom is cool quiet and dark.  She shares a bedroom with her husband who has observed her to snore. She does not often needs to go to the bathroom at night, and some nights she will not have any bathroom breaks at all.  She is under the impression that when she sleeps on her back she snores louder, her mouth gets more dry and her tongue will be at times parched. She sleeps on her right side when she goes to sleep, with one pillow for head support only.  She may sleep through the night until she has to rise in the morning.  She rises between 4:30 and 4:45 AM.  Currently she will get around 7 hours of nocturnal sleep. She does not feel as refreshed or restored right now and wonders if her sleep quality has decreased due to any organic factors.  She does feel that she is under undue stress - her employment changed, her company spun off from  the [employer] . Her offices are moving. On weekends she will take naps in daytime -     REVIEW OF SYSTEMS: Full 14 system review of systems performed and notable only for those listed, all others are neg:  Constitutional: neg Cardiovascular: neg Ear/Nose/Throat: neg  Skin: neg Eyes: neg Respiratory: neg Gastroitestinal: neg  Hematology/Lymphatic: neg  Endocrine: neg Musculoskeletal:neg Allergy/Immunology: neg Neurological: neg Psychiatric: neg Sleep : Obstructive sleep apnea with CPAP, history of restless legs   ALLERGIES: Allergies  Allergen Reactions  . Cefuroxime Axetil      Other reaction(s): yeast infection    HOME MEDICATIONS: Outpatient Medications Prior to Visit  Medication Sig Dispense Refill  . amLODipine (NORVASC) 5 MG tablet Take 5 mg by mouth daily with breakfast.     . buPROPion (WELLBUTRIN XL) 150 MG 24 hr tablet Take 150 mg by mouth daily with breakfast.     . CALCIUM PO Take 0.5 tablets by mouth daily.    . fluticasone (FLONASE) 50 MCG/ACT nasal spray Place 2 sprays into the nose daily as needed for allergies.     Marland Kitchen LORazepam (ATIVAN) 0.5 MG tablet Take 0.5 mg by mouth daily as needed.     Marland Kitchen LOVAZA 1 G capsule     . meclizine (ANTIVERT) 25 MG tablet Take 25 mg by mouth daily as needed for dizziness.    . methocarbamol (ROBAXIN) 500 MG tablet Take 1 tablet (500 mg total) by mouth every 6 (six) hours as needed. 40 tablet 1  . omeprazole (PRILOSEC) 40 MG capsule Take 40 mg by mouth as needed.     . rosuvastatin (CRESTOR) 5 MG tablet Take 5 mg by mouth daily with breakfast.     . TURMERIC PO Take 750 mg by mouth daily.    Marland Kitchen Ubiquinol 100 MG CAPS Take 100 mg by mouth daily.    . tamoxifen (NOLVADEX) 20 MG tablet Take 1 tablet (20 mg total) by mouth daily. 90 tablet 0   Facility-Administered Medications Prior to Visit  Medication Dose Route Frequency Provider Last Rate Last Dose  . gadopentetate dimeglumine (MAGNEVIST) injection 15 mL  15 mL Intravenous Once PRN Melvenia Beam, MD        PAST MEDICAL HISTORY: Past Medical History:  Diagnosis Date  . Anxiety   . Arthritis    C5-6- bone spurs- treated by Chiropratic , achiness in hips & legs   . Breast cancer ([place])   . Depression   . GERD (gastroesophageal reflux disease)   . Hot flashes   . Hyperlipidemia   . Hypertension    stress test 5 yrs. ago or > , states it was wnl, no need for cardiac f/u, states her BP is managed by Dr. Wilson Singer  . OSA on CPAP     PAST SURGICAL HISTORY: Past Surgical History:  Procedure Laterality Date  . APPENDECTOMY     as a teen   . blepheroplasy      bilateral   . BREAST RECONSTRUCTION  04/09/13  . BREAST RECONSTRUCTION WITH PLACEMENT OF TISSUE EXPANDER AND FLEX HD (ACELLULAR HYDRATED DERMIS) Bilateral 10/11/2012   Procedure: BREAST RECONSTRUCTION WITH PLACEMENT OF TISSUE EXPANDER AND FLEX HD (ACELLULAR HYDRATED DERMIS);  Surgeon: Crissie Reese, MD;  Location: [facility];  Service: Plastics;  Laterality: Bilateral;  . BREAST SURGERY Left 2006   open biopsy (benign), R breast- stereotactic core biopsy- R breast- 07/2012  . MASTECTOMY W/ SENTINEL NODE BIOPSY Right 10/11/2012   Procedure:  bilateral MASTECTOMY WITH  right  SENTINEL LYMPH NODE BIOPSY;  Surgeon: Odis Hollingshead, MD;  Location: [facility];  Service: General;  Laterality: Right;  right nuclear medicine injection 10:45 dr Harlow Mares to follow with reconstruction time alloted   . TOTAL MASTECTOMY Left 10/11/2012   Procedure: TOTAL MASTECTOMY;  Surgeon: Odis Hollingshead, MD;  Location: [facility];  Service: General;  Laterality: Left;  . TUBAL LIGATION      FAMILY HISTORY: Family History  Problem Relation Age of Onset  . Cancer Father        colon mets to lung and brain  . Colon cancer Father   . Heart disease Mother   . Diabetes Mother   . Hypertension Mother     SOCIAL HISTORY: Social History   Socioeconomic History  . Marital status: Married    Spouse name: Not on file  . Number of children: 2  . Years of education: Not on file  . Highest education level: High school graduate  Occupational History    Comment: [employer]  Social Needs  . Financial resource strain: Not on file  . Food insecurity:    Worry: Not on file    Inability: Not on file  . Transportation needs:    Medical: Not on file    Non-medical: Not on file  Tobacco Use  . Smoking status: Never Smoker  . Smokeless tobacco: Never Used  Substance and Sexual Activity  . Alcohol use: Yes    Alcohol/week: 2.0 standard drinks    Types: 2 Glasses of wine per week    Comment: <2 glasses of wine per week   . Drug use: No  .  Sexual activity: Not Currently  Lifestyle  . Physical activity:    Days per week: Not on file    Minutes per session: Not on file  . Stress: Not on file  Relationships  . Social connections:    Talks on phone: Not on file    Gets together: Not on file    Attends religious service: Not on file    Active member of club or organization: Not on file    Attends meetings of clubs or organizations: Not on file    Relationship status: Not on file  . Intimate partner violence:    Fear of current or ex partner: Not on file    Emotionally abused: Not on file    Physically abused: Not on file    Forced sexual activity: Not on file  Other Topics Concern  . Not on file  Social History Narrative   Lives at home with husband   Right handed     PHYSICAL EXAM  Vitals:   05/09/18 1243  BP: 133/86  Pulse: 94  Weight: 190 lb 9.6 oz (86.5 kg)  Height: 5\' 7"  (1.702 m)   Body mass index is 29.85 kg/m.  Generalized: Well developed, in no acute distress  Head: normocephalic and atraumatic,. Oropharynx benign mallopatti 3 Neck: Supple, circumference 15 Lungs clear  Musculoskeletal: No deformity  Skin no peripheral edema Neurological examination   Mentation: Alert oriented to time, place, history taking. Attention span and concentration appropriate. Recent and remote memory intact.  Follows all commands speech and language fluent.   Cranial nerve II-XII: Pupils were equal round reactive to light extraocular movements were full, visual field were full on confrontational test. Facial sensation and strength were normal. hearing was intact to finger rubbing bilaterally. Uvula tongue midline. head turning and shoulder shrug were normal and symmetric.Tongue protrusion  into cheek strength was normal. Motor: normal bulk and tone, full strength in the BUE, BLE,  Sensory: normal and symmetric to light touch,  Coordination: finger-nose-finger, heel-to-shin bilaterally, no dysmetria Gait and Station:  Rising up from seated position without assistance, normal stance,  moderate stride, good arm swing, smooth turning, able to perform tiptoe, and heel walking without difficulty. Tandem gait is steady  DIAGNOSTIC DATA (LABS, IMAGING, TESTING) - I reviewed patient records, labs, notes, testing and imaging myself where available.  Lab Results  Component Value Date   WBC 4.2 03/25/2014   HGB 13.6 03/25/2014   HCT 42.0 03/25/2014   MCV 92.6 03/25/2014   PLT 221 03/25/2014      Component Value Date/Time   NA 146 (H) 05/12/2017 0915   NA 143 03/25/2014 1033   K 3.9 05/12/2017 0915   K 4.1 03/25/2014 1033   CL 107 (H) 05/12/2017 0915   CO2 22 05/12/2017 0915   CO2 26 03/25/2014 1033   GLUCOSE 93 05/12/2017 0915   GLUCOSE 84 03/25/2014 1033   BUN 16 05/12/2017 0915   BUN 16.5 03/25/2014 1033   CREATININE 1.00 05/12/2017 0915   CREATININE 1.0 03/25/2014 1033   CALCIUM 10.0 05/12/2017 0915   CALCIUM 9.5 03/25/2014 1033   PROT 6.6 03/25/2014 1033   ALBUMIN 3.6 03/25/2014 1033   AST 15 03/25/2014 1033   ALT 13 03/25/2014 1033   ALKPHOS 39 (L) 03/25/2014 1033   BILITOT 0.22 03/25/2014 1033   GFRNONAA 60 05/12/2017 0915   GFRAA 69 05/12/2017 0915    Lab Results  Component Value Date   TSH 3.570 05/12/2017      ASSESSMENT AND PLAN  63 y.o. year old female here to follow-up for newly diagnosed obstructive sleep apnea with initial CPAP compliance.Compliance data dated 04/08/2018-05/07/2018 shows compliance greater than 4 hours at 100%.  Average usage 6 hours 48 minutes.  Pressure 7 to 14 cm.  EPR level 3.  Leak 95th percentile at 7.  AHI 2.3.  ESS 2.   CPAP compliance 100% reviewed data with patient Continue same settings Follow-up  In 1 year Dennie Bible, [facility], [facility], APRN  [facility] [address], [place], [place] [address] [phone]

## 2018-05-08 NOTE — Telephone Encounter (Signed)
Patient stated that her appointment today slipped her mind and she would like a call back to reschedule her appointment.

## 2018-05-08 NOTE — Telephone Encounter (Signed)
Pt was no show to apt today

## 2018-05-09 ENCOUNTER — Encounter: Payer: Self-pay | Admitting: Nurse Practitioner

## 2018-05-09 ENCOUNTER — Encounter: Payer: Self-pay | Admitting: Neurology

## 2018-05-09 ENCOUNTER — Ambulatory Visit: Payer: BLUE CROSS/BLUE SHIELD | Admitting: Nurse Practitioner

## 2018-05-09 VITALS — BP 133/86 | HR 94 | Ht 67.0 in | Wt 190.6 lb

## 2018-05-09 DIAGNOSIS — G4733 Obstructive sleep apnea (adult) (pediatric): Secondary | ICD-10-CM

## 2018-05-09 DIAGNOSIS — Z9989 Dependence on other enabling machines and devices: Secondary | ICD-10-CM

## 2018-05-09 NOTE — Patient Instructions (Signed)
CPAP compliance 100% Continue same settings Follow-up  In 1 year

## 2018-12-23 NOTE — Progress Notes (Signed)
Patient Care Team: Merrilee Seashore, MD as PCP - General (Internal Medicine)  DIAGNOSIS:    ICD-10-CM   1. Malignant neoplasm of lower-outer quadrant of right breast of female, estrogen receptor positive ([place])  C50.511    Z17.0     SUMMARY OF ONCOLOGIC HISTORY: Oncology History  Breast cancer of lower-outer quadrant of right female breast ([place])  07/31/2012 Initial Diagnosis   Right breast biopsy: 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 Tumor was ER positive PR positive HER-2/neu negative with Ki-67 5%   08/07/2012 Breast MRI   right breast 1.3 x 0.9 x 1.6 cm irregular enhancing spiculated mass in the middle third of the lateral aspect of the right breast . At 12:00 in the anterior third of the right breast was another enhancing mass measuring 7 x 6 x 5 mm   10/11/2012 Surgery   Bilateral mastectomies: T1 N0 M0 stage IA right breast cancer   11/23/2012 -  Anti-estrogen oral therapy   Tamoxifen 20 mg daily switched to anastrozole 04/27/2015 due to excessive vaginal discharge, switched back to tamoxifen because of myalgias     CHIEF COMPLIANT: Surveillance of right breast cancer  INTERVAL HISTORY: Sherry Stafford is a 63 y.o. with above-mentioned history of right breast cancer treated with bilateral mastectomies and who completed anti-estrogen therapy with tamoxifen in 01/2018 after BCI testing showed she was low risk. I last saw her a year ago. She presents to the clinic today for annual follow-up. Other than the lack of sensation and the mild discomfort associated with implants she does not have any new complaints or concerns.  After the tamoxifen has been stopped vaginal discharge has resolved.  REVIEW OF SYSTEMS:   Constitutional: Denies fevers, chills or abnormal weight loss Eyes: Denies blurriness of vision Ears, nose, mouth, throat, and face: Denies mucositis or sore throat Respiratory: Denies cough, dyspnea or wheezes Cardiovascular: Denies palpitation, chest  discomfort Gastrointestinal: Denies nausea, heartburn or change in bowel habits Skin: Denies abnormal skin rashes Lymphatics: Denies new lymphadenopathy or easy bruising Neurological: Denies numbness, tingling or new weaknesses Behavioral/Psych: Mood is stable, no new changes  Extremities: No lower extremity edema Breast: denies any pain or lumps or nodules in chest wall All other systems were reviewed with the patient and are negative.  I have reviewed the past medical history, past surgical history, social history and family history with the patient and they are unchanged from previous note.  ALLERGIES:  is allergic to cefuroxime axetil.  MEDICATIONS:  Current Outpatient Medications  Medication Sig Dispense Refill  . amLODipine (NORVASC) 5 MG tablet Take 5 mg by mouth daily with breakfast.     . buPROPion (WELLBUTRIN XL) 150 MG 24 hr tablet Take 150 mg by mouth daily with breakfast.     . CALCIUM PO Take 0.5 tablets by mouth daily.    . fluticasone (FLONASE) 50 MCG/ACT nasal spray Place 2 sprays into the nose daily as needed for allergies.     Marland Kitchen LORazepam (ATIVAN) 0.5 MG tablet Take 0.5 mg by mouth daily as needed.     Marland Kitchen LOVAZA 1 G capsule     . meclizine (ANTIVERT) 25 MG tablet Take 25 mg by mouth daily as needed for dizziness.    . methocarbamol (ROBAXIN) 500 MG tablet Take 1 tablet (500 mg total) by mouth every 6 (six) hours as needed. 40 tablet 1  . omeprazole (PRILOSEC) 40 MG capsule Take 40 mg by mouth as needed.     Marland Kitchen  rosuvastatin (CRESTOR) 5 MG tablet Take 5 mg by mouth daily with breakfast.     . TURMERIC PO Take 750 mg by mouth daily.    Marland Kitchen Ubiquinol 100 MG CAPS Take 100 mg by mouth daily.     No current facility-administered medications for this visit.    Facility-Administered Medications Ordered in Other Visits  Medication Dose Route Frequency Provider Last Rate Last Dose  . gadopentetate dimeglumine (MAGNEVIST) injection 15 mL  15 mL Intravenous Once PRN Melvenia Beam, MD        PHYSICAL EXAMINATION: ECOG PERFORMANCE STATUS: 0 - Asymptomatic  Vitals:   12/24/18 0812  BP: 128/89  Pulse: 85  Resp: 17  Temp: 98.7 F (37.1 C)  SpO2: 100%   Filed Weights   12/24/18 0812  Weight: 186 lb 6.4 oz (84.6 kg)    GENERAL: alert, no distress and comfortable SKIN: skin color, texture, turgor are normal, no rashes or significant lesions EYES: normal, Conjunctiva are pink and non-injected, sclera clear OROPHARYNX: no exudate, no erythema and lips, buccal mucosa, and tongue normal  NECK: supple, thyroid normal size, non-tender, without nodularity LYMPH: no palpable lymphadenopathy in the cervical, axillary or inguinal LUNGS: clear to auscultation and percussion with normal breathing effort HEART: regular rate & rhythm and no murmurs and no lower extremity edema ABDOMEN: abdomen soft, non-tender and normal bowel sounds MUSCULOSKELETAL: no cyanosis of digits and no clubbing  NEURO: alert & oriented x 3 with fluent speech, no focal motor/sensory deficits EXTREMITIES: No lower extremity edema BREAST: No palpable masses or nodules in either right or left reconstructed breasts. No palpable axillary supraclavicular or infraclavicular adenopathy  . (exam performed in the presence of a chaperone)  LABORATORY DATA:  I have reviewed the data as listed CMP Latest Ref Rng & Units 05/12/2017 03/25/2014 09/17/2013  Glucose 65 - 99 mg/dL 93 84 88  BUN 8 - 27 mg/dL 16 16.5 20.1  Creatinine 0.57 - 1.00 mg/dL 1.00 1.0 0.9  Sodium 134 - 144 mmol/L 146(H) 143 142  Potassium 3.5 - 5.2 mmol/L 3.9 4.1 4.0  Chloride 96 - 106 mmol/L 107(H) - -  CO2 20 - 29 mmol/L 22 26 22   Calcium 8.7 - 10.3 mg/dL 10.0 9.5 9.7  Total Protein 6.4 - 8.3 g/dL - 6.6 7.1  Total Bilirubin 0.20 - 1.20 mg/dL - 0.22 <0.20  Alkaline Phos 40 - 150 U/L - 39(L) 46  AST 5 - 34 U/L - 15 15  ALT 0 - 55 U/L - 13 17    Lab Results  Component Value Date   WBC 4.2 03/25/2014   HGB 13.6 03/25/2014   HCT  42.0 03/25/2014   MCV 92.6 03/25/2014   PLT 221 03/25/2014   NEUTROABS 2.2 03/25/2014    ASSESSMENT & PLAN:  Breast cancer of lower-outer quadrant of right female breast ([place]) Right breast grade 1 invasive ductal carcinoma with associated DCIS ER/PR positive HER-2 negative Ki-67 5% status post bilateral mastectomies T1 N0 M0 stage IA Tamoxifen 20 mg daily since August 2014 to 04/27/2015, switched to anastrozole because of vaginal discharge from 01/30/2017Switched back to tamoxifen discontinued November 2019 when BCI came back as low risk and low likelihood of benefit  Breast Cancer Surveillance: 1. Breast exam 12/24/2018: Bilateralbreast reconstructions were intact and no palpable lumps or nodules in the chest wall or axilla 2. Mammogram: Since she had bilateral mastectomies there is no role of mammograms.  Right axilla tightness: I will refer her to physical therapy  BCI low risk: No benefit for extended adjuvant therapy. Return to clinic in 1 year for follow-up in long-term survivorship clinic    No orders of the defined types were placed in this encounter.  The patient has a good understanding of the overall plan. she agrees with it. she will call with any problems that may develop before the next visit here.  Nicholas Lose, MD 12/24/2018  Julious Oka Dorshimer am acting as scribe for Dr. Nicholas Lose.  I have reviewed the above documentation for accuracy and completeness, and I agree with the above.

## 2018-12-24 ENCOUNTER — Inpatient Hospital Stay: Payer: BC Managed Care – PPO | Attending: Hematology and Oncology | Admitting: Hematology and Oncology

## 2018-12-24 ENCOUNTER — Other Ambulatory Visit: Payer: Self-pay

## 2018-12-24 DIAGNOSIS — Z9013 Acquired absence of bilateral breasts and nipples: Secondary | ICD-10-CM | POA: Diagnosis not present

## 2018-12-24 DIAGNOSIS — C50511 Malignant neoplasm of lower-outer quadrant of right female breast: Secondary | ICD-10-CM | POA: Diagnosis not present

## 2018-12-24 DIAGNOSIS — Z79899 Other long term (current) drug therapy: Secondary | ICD-10-CM | POA: Diagnosis not present

## 2018-12-24 DIAGNOSIS — Z170 Estrogen receptor positive status [ER+]: Secondary | ICD-10-CM | POA: Diagnosis not present

## 2018-12-24 NOTE — Assessment & Plan Note (Signed)
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 Breast exam 12/24/2018: Bilateralbreast reconstructions were intact and no palpable lumps or nodules in the chest wall or axilla 2. Mammogram: Since she had bilateral mastectomies there is no role of mammograms.  Right axilla tightness: I will refer her to physical therapy  BCI low risk: No benefit for extended adjuvant therapy. Return to clinic in 1 year for follow-up in long-term survivorship clinic

## 2018-12-25 ENCOUNTER — Telehealth: Payer: Self-pay | Admitting: Adult Health

## 2018-12-25 NOTE — Telephone Encounter (Signed)
I talk with patient regarding schedule  

## 2019-05-15 ENCOUNTER — Encounter: Payer: Self-pay | Admitting: Neurology

## 2019-05-15 ENCOUNTER — Other Ambulatory Visit: Payer: Self-pay

## 2019-05-15 ENCOUNTER — Ambulatory Visit (INDEPENDENT_AMBULATORY_CARE_PROVIDER_SITE_OTHER): Payer: Medicare Other | Admitting: Neurology

## 2019-05-15 VITALS — BP 154/98 | HR 88 | Temp 96.9°F | Ht 67.0 in | Wt 183.0 lb

## 2019-05-15 DIAGNOSIS — Z9989 Dependence on other enabling machines and devices: Secondary | ICD-10-CM | POA: Diagnosis not present

## 2019-05-15 DIAGNOSIS — G2581 Restless legs syndrome: Secondary | ICD-10-CM

## 2019-05-15 DIAGNOSIS — G4733 Obstructive sleep apnea (adult) (pediatric): Secondary | ICD-10-CM

## 2019-05-15 DIAGNOSIS — G250 Essential tremor: Secondary | ICD-10-CM | POA: Diagnosis not present

## 2019-05-15 NOTE — Addendum Note (Signed)
Addended by: Larey Seat on: 05/15/2019 01:43 PM   Modules accepted: Orders

## 2019-05-15 NOTE — Progress Notes (Addendum)
SLEEP MEDICINE CLINIC   Provider:  Larey Seat, MD   Primary Care Physician:  Merrilee Seashore, MD   Referring Provider: Merrilee Seashore, MD , Sarina Ill, MD    Chief Complaint  Patient presents with  . New Patient (Initial Visit)    pt alone, referral by Dr. Sarina Ill. rm 11, experiencing daytime sleepiness, usually sleeps from 9 pm-4:45 am. She snores in her sleep according  to her  husband. She states she has wokne up gasping for air, wakes up with a dry mouth, some morning headaches.Marland Kitchen     05-15-2019,  AZERA MCCLARIN is a 64 y.o. female , seen herein a RV-  She has Covid after Thanksgiving with 12 Persons, as did her husband.  [name] has been a compliant CPAP user she has used her CPAP machine 87% compliance with 26 out of 30 days, her average use at time is 6 hours 23 minutes her residual AHI is 2.5 which is an excellent resolution, her AutoSet is set with a minimum pressure of 7 and a maximum of 14 cmH2O and 3 cm expiratory pressure relief.  Her Epworth Sleepiness Scale is endorsed at only 2 out of 15 points her fatigue severity score was also low at 16 points.  She had last seen Cecille Rubin in this office in February 2020.  The patient has a underlying diagnosis of complex sleep apnea syndrome and seems to have no residual effects from her Covid infection. She also retired from [employer], and has been sleeping on average 7 hours.  She lives with her husband who has also retired.   06-13-2017 I have the pleasure of meeting with [name] today at 63 year old Caucasian right-handed female patient of Dr. Berta Minor Ahern's presenting on 14 June 2017 for a sleep evaluation.  The patient used to be followed by Dr. Wilson Singer for headaches, has a history with a diagnoses of breast cancer, hypertension, vertigo, headaches, and for a while she had low-grade headaches almost daily.  Her headaches were also characterized by an eye pain or ache, and there may have been  photophobia as she felt better when she was sunglasses.  The headaches were localized mainly retro-orbital he on the right sometimes pulsating in quality ,sometimes more throbbing sometimes with nausea often with light sensitivity and she reports that sleeping helps.  She has no family history of migraines.  She has a history of nasal rhinitis with environmental allergies.   Chief complaint according to patient : " My husband reports that I snore' " I wake up choking- on my back "  " I have Restless legs "   Sleep habits are as follows: The patient reports that she has been entrained sleep routine, and when she prepares for sleep she is usually able to fall asleep immediately once she is in bed. She retreats at 8.30- 9.30 PM.  Her bedroom is cool quiet and dark.  She shares a bedroom with her husband who has observed her to snore. She does not often needs to go to the bathroom at night, and some nights she will not have any bathroom breaks at all.  She is under the impression that when she sleeps on her back she snores louder, her mouth gets more dry and her tongue will be at times parched. She sleeps on her right side when she goes to sleep, with one pillow for head support only.  She may sleep through the night until she has to rise in the  morning.  She rises between 4:30 and 4:45 AM.  Currently she will get around 7 hours of nocturnal sleep. She does not feel as refreshed or restored right now and wonders if her sleep quality has decreased due to any organic factors.  She does feel that she is under undue stress - her employment changed, her company spun off from the mother[employer] ' [employer]" . Her offices are moving. On weekends she will take naps in daytime -   Sleep medical history and family sleep history:  Brother has OSA just started CPAP. HTN, depression, cholesterol affect patient and her brother.  Social history: married, works for [employer] for over 40 years, no tobacco use never, ETOH ; 2-4 a week.  Caffeine: coffee in AM and sometimes sodas.  No shift work history . Children - daughter 37 and son ,47 years old. One daughter has asthma, pre diabetes and obesity. Has 11 grandchildren and great grand children.   .  Her daughter is a Therapist, sports on the Covid floor- had to be admitted in September - October.     Review of Systems: Out of a complete 14 system review, the patient complains of only the following symptoms, and all other reviewed systems are negative.  RLS , mostly after vigorous exercise.  Epworth score 15/ 24  , Fatigue severity score  28/63 .  Geriatric depression score  endorsed at 2/ 15 points   How likely are you to doze in the following situations: 0 = not likely, 1 = slight chance, 2 = moderate chance, 3 = high chance  Sitting and Reading? Watching Television? Sitting inactive in a public place (theater or meeting)? Lying down in the afternoon when circumstances permit? Sitting and talking to someone? Sitting quietly after lunch without alcohol? In a car, while stopped for a few minutes in traffic? As a passenger in a car for an hour without a break?  Total = 3 points from 15/ 24 points pre CPAP.    Social History   Socioeconomic History  . Marital status: Married    Spouse name: Not on file  . Number of children: 2  . Years of education: Not on file  . Highest education level: High school graduate  Occupational History    Comment: [employer]  Tobacco Use  . Smoking status: Never Smoker  . Smokeless tobacco: Never Used  Substance and Sexual Activity  . Alcohol use: Yes    Alcohol/week: 2.0 standard drinks    Types: 2 Glasses of wine per week    Comment: <2 glasses of wine per week   . Drug use: No  . Sexual activity: Not Currently  Other Topics Concern  . Not on file  Social History Narrative   Lives at home with husband   Right handed   Social Determinants of Health   Financial Resource Strain:   . Difficulty of Paying Living Expenses: Not on file    Food Insecurity:   . Worried About Charity fundraiser in the Last Year: Not on file  . Ran Out of Food in the Last Year: Not on file  Transportation Needs:   . Lack of Transportation (Medical): Not on file  . Lack of Transportation (Non-Medical): Not on file  Physical Activity:   . Days of Exercise per Week: Not on file  . Minutes of Exercise per Session: Not on file  Stress:   . Feeling of Stress : Not on file  Social Connections:   . Frequency  of Communication with Friends and Family: Not on file  . Frequency of Social Gatherings with Friends and Family: Not on file  . Attends Religious Services: Not on file  . Active Member of Clubs or Organizations: Not on file  . Attends Archivist Meetings: Not on file  . Marital Status: Not on file  Intimate Partner Violence:   . Fear of Current or Ex-Partner: Not on file  . Emotionally Abused: Not on file  . Physically Abused: Not on file  . Sexually Abused: Not on file    Family History  Problem Relation Age of Onset  . Cancer Father        colon mets to lung and brain  . Colon cancer Father   . Heart disease Mother   . Diabetes Mother   . Hypertension Mother     Past Medical History:  Diagnosis Date  . Anxiety   . Arthritis    C5-6- bone spurs- treated by Chiropratic , achiness in hips & legs   . Breast cancer ([place])   . Depression   . GERD (gastroesophageal reflux disease)   . Hot flashes   . Hyperlipidemia   . Hypertension    stress test 5 yrs. ago or > , states it was wnl, no need for cardiac f/u, states her BP is managed by Dr. Wilson Singer  . OSA on CPAP     Past Surgical History:  Procedure Laterality Date  . APPENDECTOMY     as a teen   . blepheroplasy     bilateral   . BREAST RECONSTRUCTION  04/09/13  . BREAST RECONSTRUCTION WITH PLACEMENT OF TISSUE EXPANDER AND FLEX HD (ACELLULAR HYDRATED DERMIS) Bilateral 10/11/2012   Procedure: BREAST RECONSTRUCTION WITH PLACEMENT OF TISSUE EXPANDER AND FLEX HD  (ACELLULAR HYDRATED DERMIS);  Surgeon: Crissie Reese, MD;  Location: [facility];  Service: Plastics;  Laterality: Bilateral;  . BREAST SURGERY Left 2006   open biopsy (benign), R breast- stereotactic core biopsy- R breast- 07/2012  . MASTECTOMY W/ SENTINEL NODE BIOPSY Right 10/11/2012   Procedure:  bilateral MASTECTOMY WITH right  SENTINEL LYMPH NODE BIOPSY;  Surgeon: Odis Hollingshead, MD;  Location: [facility];  Service: General;  Laterality: Right;  right nuclear medicine injection 10:45 dr Harlow Mares to follow with reconstruction time alloted   . TOTAL MASTECTOMY Left 10/11/2012   Procedure: TOTAL MASTECTOMY;  Surgeon: Odis Hollingshead, MD;  Location: [facility];  Service: General;  Laterality: Left;  . TUBAL LIGATION      Current Outpatient Medications  Medication Sig Dispense Refill  . amLODipine (NORVASC) 5 MG tablet Take 5 mg by mouth daily with breakfast.     . buPROPion (WELLBUTRIN XL) 150 MG 24 hr tablet Take 150 mg by mouth daily with breakfast.     . fluticasone (FLONASE) 50 MCG/ACT nasal spray Place 2 sprays into the nose daily as needed for allergies.     Marland Kitchen LORazepam (ATIVAN) 0.5 MG tablet Take 0.5 mg by mouth daily as needed.     Marland Kitchen LOVAZA 1 G capsule     . MAGNESIUM OXIDE PO Take 1 tablet by mouth daily.    . meclizine (ANTIVERT) 25 MG tablet Take 25 mg by mouth daily as needed for dizziness.    . methocarbamol (ROBAXIN) 500 MG tablet Take 1 tablet (500 mg total) by mouth every 6 (six) hours as needed. 40 tablet 1  . omeprazole (PRILOSEC) 40 MG capsule Take 40 mg by mouth as needed.     Marland Kitchen  rosuvastatin (CRESTOR) 5 MG tablet Take 5 mg by mouth daily with breakfast.     . TURMERIC PO Take 750 mg by mouth daily.    Marland Kitchen Ubiquinol 100 MG CAPS Take 100 mg by mouth daily.    Marland Kitchen VITAMIN D PO Take 1 tablet by mouth daily. Vitamin D3     No current facility-administered medications for this visit.   Facility-Administered Medications Ordered in Other Visits  Medication Dose Route Frequency Provider Last  Rate Last Admin  . gadopentetate dimeglumine (MAGNEVIST) injection 15 mL  15 mL Intravenous Once PRN Melvenia Beam, MD        Allergies as of 05/15/2019 - Review Complete 05/15/2019  Allergen Reaction Noted  . Cefuroxime axetil  02/21/2017    Vitals: BP (!) 154/98   Pulse 88   Temp (!) 96.9 F (36.1 C)   Ht 5\' 7"  (1.702 m)   Wt 183 lb (83 kg)   BMI 28.66 kg/m  Last Weight:  Wt Readings from Last 1 Encounters:  05/15/19 183 lb (83 kg)   [id number] mass index is 28.66 kg/m.     Last Height:   Ht Readings from Last 1 Encounters:  05/15/19 5\' 7"  (1.702 m)    Physical exam:  General: The patient is awake, alert and appears not in acute distress. The patient is well groomed. Head: Normocephalic, atraumatic. Neck is supple. Mallampati  3 ,  neck circumference:15- . Nasal airflow patent . Retrognathia is seen.  Cardiovascular:  Regular rate and rhythm , without  murmurs or carotid bruit, and without distended neck veins. Respiratory: Lungs are clear to auscultation. Skin:  Without evidence of edema, or rash Trunk: BMI is 29.3. The patient's posture is erect.   Neurologic exam : The patient is awake and alert, oriented to place and time. Attention span & concentration ability appears normal.  Speech is fluent, without dysarthria, dysphonia or aphasia.  Mood and affect are appropriate. Cranial nerves: had a  loss of taste and smell - progressive hearing loss. Felt as if having sinusitis- ear pain. Covid - Pupils are equal and briskly reactive to light. Facial motor strength is symmetric and tongue and uvula move midline. Shoulder shrug was symmetrical.  Motor exam:  Normal tone, muscle bulk and symmetric strength in all extremities. Sensory:  Fine touch, pinprick and vibration were tested in all extremities. Proprioception tested in the upper extremities was normal. Coordination: Rapid alternating movements and Finger-to-nose maneuver normal without evidence of ataxia, dysmetria or  tremor. Gait and station: Patient walks without assistive device and is able unassisted to climb up to the exam table. Strength within normal limits. Stance is stable and normal. Turns with 3 Steps.  Deep tendon reflexes: in the upper and lower extremities are symmetric and intact.    Assessment:  After physical and neurologic examination, review of laboratory studies,  Personal review of imaging studies, reports of other /same  Imaging studies, results of polysomnography and / or neurophysiology testing and pre-existing records as far as provided in visit., my assessment is :   1) OSA/ Snoring and excessive daytime sleepiness are well treated- Apnea was well controlled.  2) Migraine headaches have improved. 3) RLS have improved -  She has tried e'lyte supplement. She has some lower back problems.  4) good penmanship- her shoulder pain has decreased, and she drew an archimedes spiral without tremor.    The patient was advised of the nature of the diagnosed disorder , the treatment options and the  risks for general health and wellness arising from not treating the condition.   I spent more than 20 minutes of face to face time with the patient.  Greater than 50% of time was spent in counseling and coordination of care. We have discussed the diagnosis and differential and I answered the patient's questions.    Plan:  Treatment plan and additional workup :  Mrs. Jemmott may continue to use CPAP under the current settings. No changes needed.  Her RLS is improved as she has retired and is not seated at her desk all the time.   I will order a ravel CPAP for Mrs Hoxit that will be set to 10 cm water pressure, close to her 95%.   RV- with NP, needing Epworth score and BMI/    Larey Seat, MD [id number], 0000000 PM  Certified in Neurology by ABPN Certified in [place] by [facility] [address] [place], [place] [address]

## 2019-05-15 NOTE — Patient Instructions (Signed)

## 2019-06-15 DIAGNOSIS — Z9229 Personal history of other drug therapy: Secondary | ICD-10-CM | POA: Insufficient documentation

## 2019-06-15 DIAGNOSIS — Z853 Personal history of malignant neoplasm of breast: Secondary | ICD-10-CM | POA: Insufficient documentation

## 2019-06-15 DIAGNOSIS — Z9989 Dependence on other enabling machines and devices: Secondary | ICD-10-CM | POA: Insufficient documentation

## 2019-06-15 DIAGNOSIS — E785 Hyperlipidemia, unspecified: Secondary | ICD-10-CM | POA: Insufficient documentation

## 2019-06-17 DIAGNOSIS — I10 Essential (primary) hypertension: Secondary | ICD-10-CM | POA: Insufficient documentation

## 2019-06-17 DIAGNOSIS — K219 Gastro-esophageal reflux disease without esophagitis: Secondary | ICD-10-CM | POA: Insufficient documentation

## 2019-06-24 DIAGNOSIS — R7301 Impaired fasting glucose: Secondary | ICD-10-CM | POA: Insufficient documentation

## 2019-07-03 ENCOUNTER — Telehealth: Payer: Self-pay | Admitting: Neurology

## 2019-07-03 NOTE — Telephone Encounter (Signed)
Pt called wanting to know if her travel cpap machine prescription can be sent [facility] to [facility] [address] [facility] [place] [facility] [place] [facility] instead of [facility] because they will actually show her how to use it and [facility] wont communicate with her. Please advise.

## 2019-07-04 NOTE — Telephone Encounter (Signed)
Called the patient back to discuss in more detail. I had reached out to [facility] and [facility] to discuss with them in regards to the travel CPAP. In what I am learning is neither of these stores carry the travel CPAP in stores. They are purchased online through their retailer. The mask and tubing are different set up that connects to that machine and would be a separate charge. Advised depending on what is purchased would determine how much assistance a local company can provide. Patient states that [facility] [place] and they have a whole line of machines and were told the orders could be sent there. Informed that we had never dealt with working through a [place] on machines and I advised I don't know anything about how their customer service would be in assisting with setting her up either. The patient at this time will proceed with continuing to use the machine she has and she states that she will hold off on purchasing a travel pap.  Advised the pt that if she decides to move forward then she could still reach back out to [facility] to discuss what are the best ones to purchase and that will allow them to assist her with setting it up. Pt verbalized understanding and was appreciative for the call back.

## 2019-12-27 ENCOUNTER — Other Ambulatory Visit: Payer: Self-pay

## 2019-12-27 ENCOUNTER — Encounter: Payer: Self-pay | Admitting: Adult Health

## 2019-12-27 ENCOUNTER — Inpatient Hospital Stay: Payer: Medicare Other | Attending: Adult Health | Admitting: Adult Health

## 2019-12-27 VITALS — BP 128/79 | HR 77 | Temp 97.3°F | Resp 20 | Ht 67.0 in | Wt 179.1 lb

## 2019-12-27 DIAGNOSIS — C50511 Malignant neoplasm of lower-outer quadrant of right female breast: Secondary | ICD-10-CM

## 2019-12-27 DIAGNOSIS — I10 Essential (primary) hypertension: Secondary | ICD-10-CM | POA: Diagnosis not present

## 2019-12-27 DIAGNOSIS — F329 Major depressive disorder, single episode, unspecified: Secondary | ICD-10-CM | POA: Diagnosis not present

## 2019-12-27 DIAGNOSIS — Z833 Family history of diabetes mellitus: Secondary | ICD-10-CM | POA: Insufficient documentation

## 2019-12-27 DIAGNOSIS — Z170 Estrogen receptor positive status [ER+]: Secondary | ICD-10-CM

## 2019-12-27 DIAGNOSIS — K219 Gastro-esophageal reflux disease without esophagitis: Secondary | ICD-10-CM | POA: Diagnosis not present

## 2019-12-27 DIAGNOSIS — Z801 Family history of malignant neoplasm of trachea, bronchus and lung: Secondary | ICD-10-CM | POA: Diagnosis not present

## 2019-12-27 DIAGNOSIS — Z79899 Other long term (current) drug therapy: Secondary | ICD-10-CM | POA: Insufficient documentation

## 2019-12-27 DIAGNOSIS — Z9013 Acquired absence of bilateral breasts and nipples: Secondary | ICD-10-CM | POA: Diagnosis not present

## 2019-12-27 DIAGNOSIS — E785 Hyperlipidemia, unspecified: Secondary | ICD-10-CM | POA: Diagnosis not present

## 2019-12-27 DIAGNOSIS — F419 Anxiety disorder, unspecified: Secondary | ICD-10-CM | POA: Insufficient documentation

## 2019-12-27 DIAGNOSIS — Z79810 Long term (current) use of selective estrogen receptor modulators (SERMs): Secondary | ICD-10-CM | POA: Insufficient documentation

## 2019-12-27 DIAGNOSIS — Z800 Family history of malignant neoplasm of digestive organs: Secondary | ICD-10-CM | POA: Insufficient documentation

## 2019-12-27 DIAGNOSIS — Z8249 Family history of ischemic heart disease and other diseases of the circulatory system: Secondary | ICD-10-CM | POA: Insufficient documentation

## 2019-12-27 NOTE — Progress Notes (Signed)
 CLINIC:  Survivorship   REASON FOR VISIT:  Routine follow-up for history of breast cancer.   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 Tumor was ER positive PR positive HER-2/neu negative with Ki-67 5%   08/07/2012 Breast MRI   right breast 1.3 x 0.9 x 1.6 cm irregular enhancing spiculated mass in the middle third of the lateral aspect of the right breast . At 12:00 in the anterior third of the right breast was another enhancing mass measuring 7 x 6 x 5 mm   10/11/2012 Surgery   Bilateral mastectomies: T1 N0 M0 stage IA right breast cancer   11/23/2012 - 10/2018 Anti-estrogen oral therapy   Tamoxifen 20 mg daily switched to anastrozole 04/27/2015 due to excessive vaginal discharge, switched back to tamoxifen because of myalgias      INTERVAL HISTORY:  Sherry Stafford presents to the [facility]orship [facility] today for routine follow-up for her history of breast cancer.  Overall, she reports feeling quite well. She completed tamoxifen last year and is feeling well. She notes that she sees her pcp every 6 months for labs and f/u and has no physical concerns.  She sees gyn annually.  Unfortunately, Mikaylee fell and dislocated her shoulder last week.  She had it put back in place and is seeing ortho next week in f/u.  She has not been exercising due to this.      REVIEW OF SYSTEMS:  Review of Systems  Constitutional: Negative for appetite change, chills, fatigue, fever and unexpected weight change.  HENT:   Negative for hearing loss and lump/mass.   Eyes: Negative for eye problems and icterus.  Respiratory: Negative for chest tightness, cough and shortness of breath.   Cardiovascular: Negative for chest pain, leg swelling and palpitations.  Gastrointestinal: Negative for abdominal distention, abdominal pain, constipation,  nausea and vomiting.  Endocrine: Negative for hot flashes.  Genitourinary: Negative for difficulty urinating.   Musculoskeletal: Negative for arthralgias.  Skin: Negative for itching and rash.  Neurological: Negative for dizziness, extremity weakness, headaches and numbness.  Hematological: Negative for adenopathy.  Psychiatric/Behavioral: Negative for depression. The patient is not nervous/anxious.   Breast: Denies any new nodularity, masses, tenderness, nipple changes, or nipple discharge.       PAST MEDICAL/SURGICAL HISTORY:  Past Medical History:  Diagnosis Date  . Anxiety   . Arthritis    C5-6- bone spurs- treated by Chiropratic , achiness in hips & legs   . Breast cancer (HCC)   . Depression   . GERD (gastroesophageal reflux disease)   . Hot flashes   . Hyperlipidemia   . Hypertension    stress test 5 yrs. ago or > , states it was wnl, no need for cardiac f/u, states her BP is managed by Dr. Kohut  . OSA on CPAP    Past Surgical History:  Procedure Laterality Date  . APPENDECTOMY     as a teen   . blepheroplasy     bilateral   . BREAST RECONSTRUCTION  04/09/13  . BREAST RECONSTRUCTION WITH PLACEMENT OF TISSUE EXPANDER AND FLEX HD (ACELLULAR HYDRATED DERMIS) Bilateral 10/11/2012   Procedure: BREAST RECONSTRUCTION WITH PLACEMENT OF TISSUE EXPANDER AND FLEX HD (ACELLULAR HYDRATED DERMIS);  Surgeon: David Bowers, MD;  Location: [facility];  Service: Plastics;  Laterality: Bilateral;  . BREAST SURGERY Left 2006   open   biopsy (benign), R breast- stereotactic core biopsy- R breast- 07/2012  . MASTECTOMY W/ SENTINEL NODE BIOPSY Right 10/11/2012   Procedure:  bilateral MASTECTOMY WITH right  SENTINEL LYMPH NODE BIOPSY;  Surgeon: Todd J Rosenbower, MD;  Location: [facility];  Service: General;  Laterality: Right;  right nuclear medicine injection 10:45 dr bowers to follow with reconstruction time alloted   . TOTAL MASTECTOMY Left 10/11/2012   Procedure: TOTAL MASTECTOMY;  Surgeon: Todd J  Rosenbower, MD;  Location: [facility];  Service: General;  Laterality: Left;  . TUBAL LIGATION       ALLERGIES:  Allergies  Allergen Reactions  . Cefuroxime Axetil     Other reaction(s): yeast infection     CURRENT MEDICATIONS:  Outpatient Encounter Medications as of 12/27/2019  Medication Sig  . amLODipine (NORVASC) 5 MG tablet Take 5 mg by mouth daily with breakfast.   . buPROPion (WELLBUTRIN XL) 150 MG 24 hr tablet Take 150 mg by mouth daily with breakfast.   . fluticasone (FLONASE) 50 MCG/ACT nasal spray Place 2 sprays into the nose daily as needed for allergies.   . LORazepam (ATIVAN) 0.5 MG tablet Take 0.5 mg by mouth daily as needed.   . losartan (COZAAR) 25 MG tablet Take by mouth.  . LOVAZA 1 G capsule   . MAGNESIUM OXIDE PO Take 1 tablet by mouth daily.  . meclizine (ANTIVERT) 25 MG tablet Take 25 mg by mouth daily as needed for dizziness.  . methocarbamol (ROBAXIN) 500 MG tablet Take 1 tablet (500 mg total) by mouth every 6 (six) hours as needed.  . omeprazole (PRILOSEC) 40 MG capsule Take 40 mg by mouth as needed.   . rOPINIRole (REQUIP) 0.25 MG tablet Take by mouth.  . rosuvastatin (CRESTOR) 5 MG tablet Take 5 mg by mouth daily with breakfast.   . tiZANidine (ZANAFLEX) 2 MG tablet tizanidine 2 mg tablet  TK 1 T PO TID PRN STOP METAXALONE  . traZODone (DESYREL) 50 MG tablet 1 3 TABLET AT BEDTIME AS NEEDED ONCE A DAY ORALLY 30 DAY(S)  . TURMERIC PO Take 750 mg by mouth daily.  . Ubiquinol 100 MG CAPS Take 100 mg by mouth daily.  . VITAMIN D PO Take 1 tablet by mouth daily. Vitamin D3   Facility-Administered Encounter Medications as of 12/27/2019  Medication  . gadopentetate dimeglumine (MAGNEVIST) injection 15 mL     ONCOLOGIC FAMILY HISTORY:  Family History  Problem Relation Age of Onset  . Cancer Father        colon mets to lung and brain  . Colon cancer Father   . Heart disease Mother   . Diabetes Mother   . Hypertension Mother     GENETIC  COUNSELING/TESTING: Not at this time  SOCIAL HISTORY:  Social History   Socioeconomic History  . Marital status: Married    Spouse name: Not on file  . Number of children: 2  . Years of education: Not on file  . Highest education level: High school graduate  Occupational History    Comment: [employer]  Tobacco Use  . Smoking status: Never Smoker  . Smokeless tobacco: Never Used  Vaping Use  . Vaping Use: Never used  Substance and Sexual Activity  . Alcohol use: Yes    Alcohol/week: 2.0 standard drinks    Types: 2 Glasses of wine per week    Comment: <2 glasses of wine per week   . Drug use: No  . Sexual activity: Not Currently  Other   Topics Concern  . Not on file  Social History Narrative   Lives at home with husband   Right handed   Social Determinants of Health   Financial Resource Strain:   . Difficulty of Paying Living Expenses: Not on file  Food Insecurity:   . Worried About Charity fundraiser in the Last Year: Not on file  . Ran Out of Food in the Last Year: Not on file  Transportation Needs:   . Lack of Transportation (Medical): Not on file  . Lack of Transportation (Non-Medical): Not on file  Physical Activity:   . Days of Exercise per Week: Not on file  . Minutes of Exercise per Session: Not on file  Stress:   . Feeling of Stress : Not on file  Social Connections:   . Frequency of Communication with Friends and Family: Not on file  . Frequency of Social Gatherings with Friends and Family: Not on file  . Attends Religious Services: Not on file  . Active Member of Clubs or Organizations: Not on file  . Attends Archivist Meetings: Not on file  . Marital Status: Not on file  Intimate Partner Violence:   . Fear of Current or Ex-Partner: Not on file  . Emotionally Abused: Not on file  . Physically Abused: Not on file  . Sexually Abused: Not on file      PHYSICAL EXAMINATION:  Vital Signs: Vitals:   12/27/19 1100  BP: 128/79  Pulse: 77    Resp: 20  Temp: (!) 97.3 F (36.3 C)  SpO2: 98%   Filed Weights   12/27/19 1100  Weight: 179 lb 1.6 oz (81.2 kg)   General: Well-nourished, well-appearing female in no acute distress.  Unaccompanied ENT: Head is normocephalic.  Pupils equal and reactive to light. Conjunctivae clear without exudate.  Sclerae anicteric. Mask in place  Lymph: No cervical, supraclavicular, or infraclavicular lymphadenopathy noted on palpation.  Cardiovascular: Regular rate and rhythm.Marland Kitchen Respiratory: Clear to auscultation bilaterally. Chest expansion symmetric; breathing non-labored.  Breast Exam:  S/p bilateral mastectomy with reconstruction -Axilla: No axillary adenopathy bilaterally.  GI: Abdomen soft and round; non-tender, non-distended. Bowel sounds normoactive. No hepatosplenomegaly.   GU: Deferred.  Neuro: No focal deficits. Steady gait.  Psych: Mood and affect normal and appropriate for situation.  MSK: No focal spinal tenderness to palpation, full range of motion in bilateral upper extremities Extremities: No edema. Skin: Warm and dry.  LABORATORY DATA:  None for this visit   DIAGNOSTIC IMAGING:  Most recent mammogram: n/a s/p bilateral mastectomies    ASSESSMENT AND PLAN:  Ms.. Stafford is a pleasant 64 y.o. female with history of Stage IA right breast invasive ductal carcinoma, ER+/PR+/HER2-, diagnosed in 07/2012, treated with lumpectomy, adjuvant radiation therapy, and anti-estrogen therapy with Tamoxifen and Anastrozole completed therapy in 2019/2020.  She presents to the Survivorship [facility] for surveillance and routine follow-up.   1. History of breast cancer:  Ms. Afshar is currently clinically and radiographically without evidence of disease or recurrence of breast cancer. She is now several years out from her breast cancer diagnosis.  She will f/u with her PCP and gyn for future breast cancer surveillance and will see [place] on an as needed basis.    2. Breast implants: She is s/p bilateral  mastectomies with silicon implant placement.  I recommended f/u with her plastic surgeon to discuss the utility of ultrasound or MRI to evaluate for silent rupture, as most silicone implant brands recommend this.  3. Bone health:  Given Ms. Starnes's age, history of breast cancer, and her previous anti estrogen therapy, she is at risk for bone demineralization. Her last DEXA scan was in 02/2018 and was normal. She was given education on specific food and activities to promote bone health.  4. Cancer screening:  Due to Ms. Quadros's history and her age, she should receive screening for skin cancers, colon cancer, and gynecologic cancers. She was encouraged to follow-up with her PCP for appropriate cancer screenings.   5. Health maintenance and wellness promotion: Ms. Mowatt was encouraged to consume 5-7 servings of fruits and vegetables per day. She was also encouraged to engage in moderate to vigorous exercise for 30 minutes per day most days of the week. She was instructed to limit her alcohol consumption and continue to abstain from tobacco us.    Dispo:  -Return to cancer center PRN -f/u with plastic surgeon  Total encounter time: 20 minutes*  Lindsey Causey, NP 12/27/19 11:31 AM Medical Oncology and Hematology [facility] [address] , [place] [address] Tel. [phone]    Fax. [phone]  *Total Encounter Time as defined by the Centers for Medicare and Medicaid Services includes, in addition to the face-to-face time of a patient visit (documented in the note above) non-face-to-face time: obtaining and reviewing outside history, ordering and reviewing medications, tests or procedures, care coordination (communications with other health care professionals or caregivers) and documentation in the medical record.   

## 2019-12-31 ENCOUNTER — Telehealth: Payer: Self-pay | Admitting: Adult Health

## 2019-12-31 NOTE — Telephone Encounter (Signed)
No 10/1 los, no changes made to pt schedule

## 2020-05-14 ENCOUNTER — Ambulatory Visit (INDEPENDENT_AMBULATORY_CARE_PROVIDER_SITE_OTHER): Payer: Medicare Other | Admitting: Adult Health

## 2020-05-14 ENCOUNTER — Encounter: Payer: Self-pay | Admitting: Adult Health

## 2020-05-14 VITALS — BP 131/86 | HR 75 | Ht 67.0 in | Wt 181.0 lb

## 2020-05-14 DIAGNOSIS — Z9989 Dependence on other enabling machines and devices: Secondary | ICD-10-CM | POA: Diagnosis not present

## 2020-05-14 DIAGNOSIS — G4733 Obstructive sleep apnea (adult) (pediatric): Secondary | ICD-10-CM | POA: Diagnosis not present

## 2020-05-14 NOTE — Progress Notes (Signed)
PATIENT: Sherry Stafford DOB: Jun 04, 1955  REASON FOR VISIT: follow up HISTORY FROM: patient  HISTORY OF PRESENT ILLNESS: Today 05/14/20:  Sherry Stafford is a 65 year old female with a history of obstructive sleep apnea on CPAP.  She reports that she has been using the CPAP regularly.  Denies any new symptoms.  Reports that when she is congested she is unable to use the nasal pillows.  She returns today for an evaluation.   Compliance Report  Compliance Payor [employer] [place] Usage 04/14/2020 - 05/13/2020 Usage days 28/30 days (93%) >= 4 hours 24 days (80%) Average usage (days used) 6 hours 57 minutes   AirSense 10 AutoSet Serial number [id number] Mode AutoSet Min Pressure 7 cmH2O Max Pressure 14 cmH2O EPR Fulltime EPR level 3 Response Standard  Therapy Pressure - cmH2O Median: 7.7 95th percentile: 9.6 Maximum: 10.9 Leaks - L/min Median: 0.0 95th percentile: 9.3 Maximum: 18.7 Events per hour AI: 2.2 HI: 0.2 AHI: 2.4 Apnea Index Central: 1.1 Obstructive: 0.9 Unknown: 0.0  REVIEW OF SYSTEMS: Out of a complete 14 system review of symptoms, the patient complains only of the following symptoms, and all other reviewed systems are negative.  FSS 39 ESS 3  ALLERGIES: Allergies  Allergen Reactions  . Cefuroxime Axetil     Other reaction(s): yeast infection    HOME MEDICATIONS: Outpatient Medications Prior to Visit  Medication Sig Dispense Refill  . amLODipine (NORVASC) 5 MG tablet Take 5 mg by mouth daily with breakfast.     . buPROPion (WELLBUTRIN XL) 150 MG 24 hr tablet Take 150 mg by mouth daily with breakfast.     . fluticasone (FLONASE) 50 MCG/ACT nasal spray Place 2 sprays into the nose daily as needed for allergies.     Marland Kitchen losartan (COZAAR) 25 MG tablet Take by mouth.    Marland Kitchen LOVAZA 1 G capsule     . MAGNESIUM OXIDE PO Take 1 tablet by mouth daily.    . meclizine (ANTIVERT) 25 MG tablet Take 25 mg by mouth daily as needed for dizziness.    Marland Kitchen omeprazole (PRILOSEC)  40 MG capsule Take 40 mg by mouth as needed.     Marland Kitchen rOPINIRole (REQUIP) 0.25 MG tablet Take by mouth.    . rosuvastatin (CRESTOR) 5 MG tablet Take 5 mg by mouth daily with breakfast.     . tiZANidine (ZANAFLEX) 2 MG tablet tizanidine 2 mg tablet  TK 1 T PO TID PRN STOP METAXALONE    . traZODone (DESYREL) 50 MG tablet 1 3 TABLET AT BEDTIME AS NEEDED ONCE A DAY ORALLY 30 DAY(S)    . TURMERIC PO Take 750 mg by mouth daily.    Marland Kitchen Ubiquinol 100 MG CAPS Take 100 mg by mouth daily.    Marland Kitchen VITAMIN D PO Take 1 tablet by mouth daily. Vitamin D3    . LORazepam (ATIVAN) 0.5 MG tablet Take 0.5 mg by mouth daily as needed.     . methocarbamol (ROBAXIN) 500 MG tablet Take 1 tablet (500 mg total) by mouth every 6 (six) hours as needed. 40 tablet 1   Facility-Administered Medications Prior to Visit  Medication Dose Route Frequency Provider Last Rate Last Admin  . gadopentetate dimeglumine (MAGNEVIST) injection 15 mL  15 mL Intravenous Once PRN Melvenia Beam, MD        PAST MEDICAL HISTORY: Past Medical History:  Diagnosis Date  . Anxiety   . Arthritis    C5-6- bone spurs- treated by Chiropratic , achiness  in hips & legs   . Breast cancer ([place])   . Depression   . GERD (gastroesophageal reflux disease)   . Hot flashes   . Hyperlipidemia   . Hypertension    stress test 5 yrs. ago or > , states it was wnl, no need for cardiac f/u, states her BP is managed by Dr. Wilson Singer  . OSA on CPAP     PAST SURGICAL HISTORY: Past Surgical History:  Procedure Laterality Date  . APPENDECTOMY     as a teen   . blepheroplasy     bilateral   . BREAST RECONSTRUCTION  04/09/13  . BREAST RECONSTRUCTION WITH PLACEMENT OF TISSUE EXPANDER AND FLEX HD (ACELLULAR HYDRATED DERMIS) Bilateral 10/11/2012   Procedure: BREAST RECONSTRUCTION WITH PLACEMENT OF TISSUE EXPANDER AND FLEX HD (ACELLULAR HYDRATED DERMIS);  Surgeon: Crissie Reese, MD;  Location: [facility];  Service: Plastics;  Laterality: Bilateral;  . BREAST SURGERY Left 2006    open biopsy (benign), R breast- stereotactic core biopsy- R breast- 07/2012  . MASTECTOMY W/ SENTINEL NODE BIOPSY Right 10/11/2012   Procedure:  bilateral MASTECTOMY WITH right  SENTINEL LYMPH NODE BIOPSY;  Surgeon: Odis Hollingshead, MD;  Location: [facility];  Service: General;  Laterality: Right;  right nuclear medicine injection 10:45 dr Harlow Mares to follow with reconstruction time alloted   . TOTAL MASTECTOMY Left 10/11/2012   Procedure: TOTAL MASTECTOMY;  Surgeon: Odis Hollingshead, MD;  Location: [facility];  Service: General;  Laterality: Left;  . TUBAL LIGATION      FAMILY HISTORY: Family History  Problem Relation Age of Onset  . Cancer Father        colon mets to lung and brain  . Colon cancer Father   . Heart disease Mother   . Diabetes Mother   . Hypertension Mother     SOCIAL HISTORY: Social History   Socioeconomic History  . Marital status: Married    Spouse name: Not on file  . Number of children: 2  . Years of education: Not on file  . Highest education level: High school graduate  Occupational History    Comment: [employer]  Tobacco Use  . Smoking status: Never Smoker  . Smokeless tobacco: Never Used  Vaping Use  . Vaping Use: Never used  Substance and Sexual Activity  . Alcohol use: Yes    Alcohol/week: 2.0 standard drinks    Types: 2 Glasses of wine per week    Comment: <2 glasses of wine per week   . Drug use: No  . Sexual activity: Not Currently  Other Topics Concern  . Not on file  Social History Narrative   Lives at home with husband   Right handed   Social Determinants of Health   Financial Resource Strain: Not on file  Food Insecurity: Not on file  Transportation Needs: Not on file  Physical Activity: Not on file  Stress: Not on file  Social Connections: Not on file  Intimate Partner Violence: Not on file      PHYSICAL EXAM  Vitals:   05/14/20 1348  BP: 131/86  Pulse: 75  Weight: 181 lb (82.1 kg)  Height: 5\' 7"  (1.702 m)   Body mass  index is 28.35 kg/m.  Generalized: Well developed, in no acute distress  Chest: Lungs clear to auscultation bilaterally  Neurological examination  Mentation: Alert oriented to time, place, history taking. Follows all commands speech and language fluent Cranial nerve II-XII: Extraocular movements were full, visual field were full  on confrontational test Head turning and shoulder shrug  were normal and symmetric. Motor: The motor testing reveals 5 over 5 strength of all 4 extremities. Good symmetric motor tone is noted throughout.  Sensory: Sensory testing is intact to soft touch on all 4 extremities. No evidence of extinction is noted.  Gait and station: Gait is normal.    DIAGNOSTIC DATA (LABS, IMAGING, TESTING) - I reviewed patient records, labs, notes, testing and imaging myself where available.  Lab Results  Component Value Date   WBC 4.2 03/25/2014   HGB 13.6 03/25/2014   HCT 42.0 03/25/2014   MCV 92.6 03/25/2014   PLT 221 03/25/2014      Component Value Date/Time   NA 146 (H) 05/12/2017 0915   NA 143 03/25/2014 1033   K 3.9 05/12/2017 0915   K 4.1 03/25/2014 1033   CL 107 (H) 05/12/2017 0915   CO2 22 05/12/2017 0915   CO2 26 03/25/2014 1033   GLUCOSE 93 05/12/2017 0915   GLUCOSE 84 03/25/2014 1033   BUN 16 05/12/2017 0915   BUN 16.5 03/25/2014 1033   CREATININE 1.00 05/12/2017 0915   CREATININE 1.0 03/25/2014 1033   CALCIUM 10.0 05/12/2017 0915   CALCIUM 9.5 03/25/2014 1033   PROT 6.6 03/25/2014 1033   ALBUMIN 3.6 03/25/2014 1033   AST 15 03/25/2014 1033   ALT 13 03/25/2014 1033   ALKPHOS 39 (L) 03/25/2014 1033   BILITOT 0.22 03/25/2014 1033   GFRNONAA 60 05/12/2017 0915   GFRAA 69 05/12/2017 0915   No results found for: CHOL, HDL, LDLCALC, LDLDIRECT, TRIG, CHOLHDL No results found for: HGBA1C No results found for: VITAMINB12 Lab Results  Component Value Date   TSH 3.570 05/12/2017      ASSESSMENT AND PLAN 65 y.o. year old female  has a past medical  history of Anxiety, Arthritis, Breast cancer ([place]), Depression, GERD (gastroesophageal reflux disease), Hot flashes, Hyperlipidemia, Hypertension, and OSA on CPAP. here with:  1. OSA on CPAP  - CPAP compliance excellent - Good treatment of AHI  - Encourage patient to use CPAP nightly and > 4 hours each night - F/U in 1 year or sooner if needed   I spent 20 minutes of face-to-face and non-face-to-face time with patient.  This included previsit chart review, lab review, study review, order entry, electronic health record documentation, patient education.  Ward Givens, MSN, NP-C 05/14/2020, 2:27 PM [facility] [address], [place], [place] [address] [phone]

## 2020-05-14 NOTE — Patient Instructions (Signed)
Continue using CPAP nightly and greater than 4 hours each night °If your symptoms worsen or you develop new symptoms please let us know.  ° °

## 2020-07-29 DIAGNOSIS — F419 Anxiety disorder, unspecified: Secondary | ICD-10-CM | POA: Insufficient documentation

## 2020-07-29 DIAGNOSIS — F3342 Major depressive disorder, recurrent, in full remission: Secondary | ICD-10-CM | POA: Insufficient documentation

## 2021-05-18 ENCOUNTER — Telehealth (INDEPENDENT_AMBULATORY_CARE_PROVIDER_SITE_OTHER): Payer: Medicare Other | Admitting: Adult Health

## 2021-05-18 DIAGNOSIS — Z9989 Dependence on other enabling machines and devices: Secondary | ICD-10-CM

## 2021-05-18 DIAGNOSIS — G4733 Obstructive sleep apnea (adult) (pediatric): Secondary | ICD-10-CM

## 2021-05-18 NOTE — Progress Notes (Signed)
PATIENT: Sherry Stafford DOB: 1955/04/20  REASON FOR VISIT: follow up HISTORY FROM: patient PRIMARY NEUROLOGIST:   Virtual Visit via Video Note  I connected with Sherry Stafford on 05/18/21 at  3:00 PM EST by a video enabled telemedicine application located remotely at [facility] and verified that I am speaking with the correct person using two identifiers who was located at their own home.   I discussed the limitations of evaluation and management by telemedicine and the availability of in person appointments. The patient expressed understanding and agreed to proceed.   PATIENT: Sherry Stafford DOB: 1955-09-21  REASON FOR VISIT: follow up HISTORY FROM: patient  HISTORY OF PRESENT ILLNESS: Today 05/18/21:  Sherry Stafford is a 66 year old female with a history of obstructive sleep apnea on CPAP.  She reports that CPAP is working well for her.  Download is below    REVIEW OF SYSTEMS: Out of a complete 14 system review of symptoms, the patient complains only of the following symptoms, and all other reviewed systems are negative.  ALLERGIES: Allergies  Allergen Reactions   Cefuroxime Axetil     Other reaction(s): yeast infection    HOME MEDICATIONS: Outpatient Medications Prior to Visit  Medication Sig Dispense Refill   amLODipine (NORVASC) 5 MG tablet Take 5 mg by mouth daily with breakfast.      buPROPion (WELLBUTRIN XL) 150 MG 24 hr tablet Take 150 mg by mouth daily with breakfast.      fluticasone (FLONASE) 50 MCG/ACT nasal spray Place 2 sprays into the nose daily as needed for allergies.      losartan (COZAAR) 25 MG tablet Take by mouth.     LOVAZA 1 G capsule      MAGNESIUM OXIDE PO Take 1 tablet by mouth daily.     meclizine (ANTIVERT) 25 MG tablet Take 25 mg by mouth daily as needed for dizziness.     omeprazole (PRILOSEC) 40 MG capsule Take 40 mg by mouth as needed.      rOPINIRole (REQUIP) 0.25 MG tablet Take by mouth.     rosuvastatin (CRESTOR) 5 MG  tablet Take 5 mg by mouth daily with breakfast.      tiZANidine (ZANAFLEX) 2 MG tablet tizanidine 2 mg tablet  TK 1 T PO TID PRN STOP METAXALONE     traZODone (DESYREL) 50 MG tablet 1 3 TABLET AT BEDTIME AS NEEDED ONCE A DAY ORALLY 30 DAY(S)     TURMERIC PO Take 750 mg by mouth daily.     Ubiquinol 100 MG CAPS Take 100 mg by mouth daily.     VITAMIN D PO Take 1 tablet by mouth daily. Vitamin D3     Facility-Administered Medications Prior to Visit  Medication Dose Route Frequency Provider Last Rate Last Admin   gadopentetate dimeglumine (MAGNEVIST) injection 15 mL  15 mL Intravenous Once PRN Melvenia Beam, MD        PAST MEDICAL HISTORY: Past Medical History:  Diagnosis Date   Anxiety    Arthritis    C5-6- bone spurs- treated by Chiropratic , achiness in hips & legs    Breast cancer ([place])    Depression    GERD (gastroesophageal reflux disease)    Hot flashes    Hyperlipidemia    Hypertension    stress test 5 yrs. ago or > , states it was wnl, no need for cardiac f/u, states her BP is managed by Dr. Wilson Singer   OSA on CPAP  PAST SURGICAL HISTORY: Past Surgical History:  Procedure Laterality Date   APPENDECTOMY     as a teen    blepheroplasy     bilateral    BREAST RECONSTRUCTION  04/09/13   BREAST RECONSTRUCTION WITH PLACEMENT OF TISSUE EXPANDER AND FLEX HD (ACELLULAR HYDRATED DERMIS) Bilateral 10/11/2012   Procedure: BREAST RECONSTRUCTION WITH PLACEMENT OF TISSUE EXPANDER AND FLEX HD (ACELLULAR HYDRATED DERMIS);  Surgeon: Crissie Reese, MD;  Location: [place];  Service: Plastics;  Laterality: Bilateral;   BREAST SURGERY Left 2006   open biopsy (benign), R breast- stereotactic core biopsy- R breast- 07/2012   MASTECTOMY W/ SENTINEL NODE BIOPSY Right 10/11/2012   Procedure:  bilateral MASTECTOMY WITH right  SENTINEL LYMPH NODE BIOPSY;  Surgeon: Odis Hollingshead, MD;  Location: [place];  Service: General;  Laterality: Right;  right nuclear medicine injection 10:45 dr Harlow Mares to follow  with reconstruction time alloted    TOTAL MASTECTOMY Left 10/11/2012   Procedure: TOTAL MASTECTOMY;  Surgeon: Odis Hollingshead, MD;  Location: [place];  Service: General;  Laterality: Left;   TUBAL LIGATION      FAMILY HISTORY: Family History  Problem Relation Age of Onset   Cancer Father        colon mets to lung and brain   Colon cancer Father    Heart disease Mother    Diabetes Mother    Hypertension Mother     SOCIAL HISTORY: Social History   Socioeconomic History   Marital status: Married    Spouse name: Not on file   Number of children: 2   Years of education: Not on file   Highest education level: High school graduate  Occupational History    Comment: [employer]  Tobacco Use   Smoking status: Never   Smokeless tobacco: Never  Vaping Use   Vaping Use: Never used  Substance and Sexual Activity   Alcohol use: Yes    Alcohol/week: 2.0 standard drinks    Types: 2 Glasses of wine per week    Comment: <2 glasses of wine per week    Drug use: No   Sexual activity: Not Currently  Other Topics Concern   Not on file  Social History Narrative   Lives at home with husband   Right handed   Social Determinants of Health   Financial Resource Strain: Not on file  Food Insecurity: Not on file  Transportation Needs: Not on file  Physical Activity: Not on file  Stress: Not on file  Social Connections: Not on file  Intimate Partner Violence: Not on file      PHYSICAL EXAM Generalized: Well developed, in no acute distress   Neurological examination  Mentation: Alert oriented to time, place, history taking. Follows all commands speech and language fluent Cranial nerve II-XII:Extraocular movements were full. Facial symmetry noted. uvula tongue midline. Head turning and shoulder shrug  were normal and symmetric. Motor: Good strength throughout subjectively per patient Sensory: Sensory testing is intact to soft touch on all 4 extremities subjectively per  patient Coordination: Cerebellar testing reveals good finger-nose-finger  Gait and station: Patient is able to stand from a seated position. gait is normal.  Reflexes: UTA  DIAGNOSTIC DATA (LABS, IMAGING, TESTING) - I reviewed patient records, labs, notes, testing and imaging myself where available.  Lab Results  Component Value Date   WBC 4.2 03/25/2014   HGB 13.6 03/25/2014   HCT 42.0 03/25/2014   MCV 92.6 03/25/2014   PLT 221 03/25/2014  Component Value Date/Time   NA 146 (H) 05/12/2017 0915   NA 143 03/25/2014 1033   K 3.9 05/12/2017 0915   K 4.1 03/25/2014 1033   CL 107 (H) 05/12/2017 0915   CO2 22 05/12/2017 0915   CO2 26 03/25/2014 1033   GLUCOSE 93 05/12/2017 0915   GLUCOSE 84 03/25/2014 1033   BUN 16 05/12/2017 0915   BUN 16.5 03/25/2014 1033   CREATININE 1.00 05/12/2017 0915   CREATININE 1.0 03/25/2014 1033   CALCIUM 10.0 05/12/2017 0915   CALCIUM 9.5 03/25/2014 1033   PROT 6.6 03/25/2014 1033   ALBUMIN 3.6 03/25/2014 1033   AST 15 03/25/2014 1033   ALT 13 03/25/2014 1033   ALKPHOS 39 (L) 03/25/2014 1033   BILITOT 0.22 03/25/2014 1033   GFRNONAA 60 05/12/2017 0915   GFRAA 69 05/12/2017 0915    Lab Results  Component Value Date   TSH 3.570 05/12/2017      ASSESSMENT AND PLAN 66 y.o. year old female  has a past medical history of Anxiety, Arthritis, Breast cancer ([place]), Depression, GERD (gastroesophageal reflux disease), Hot flashes, Hyperlipidemia, Hypertension, and OSA on CPAP. here with:  OSA on CPAP  CPAP compliance excellent Residual AHI is good Encouraged patient to continue using CPAP nightly and > 4 hours each night F/U in 1 year or sooner if needed  I Ward Givens, MSN, NP-C 05/18/2021, 3:03 PM [facility] [address], [place], [place] [address] [phone]

## 2021-11-16 ENCOUNTER — Encounter: Payer: Self-pay | Admitting: Neurology

## 2021-11-16 ENCOUNTER — Ambulatory Visit (INDEPENDENT_AMBULATORY_CARE_PROVIDER_SITE_OTHER): Payer: Medicare Other | Admitting: Neurology

## 2021-11-16 VITALS — BP 122/83 | HR 77 | Ht 67.0 in | Wt 176.6 lb

## 2021-11-16 DIAGNOSIS — G250 Essential tremor: Secondary | ICD-10-CM | POA: Diagnosis not present

## 2021-11-16 MED ORDER — PRIMIDONE 50 MG PO TABS: 25.0000 mg | ORAL_TABLET | Freq: Every day | ORAL | 4 refills | Status: DC

## 2021-11-16 NOTE — Progress Notes (Signed)
[facility]    Provider:  Dr Jaynee Eagles Referring Provider: Golden Pop, MD Primary Care Physician:  Golden Pop, MD  CC:  Headaches  She is a patient of ours for sleep apnea and I saw her in the past for headaches. She was in bed and shaking woke her up she could feel it, bilaterally, no altered awareness, but when she look sin the mirror head is shaking. No family history but has had essential tremor in the hands for a while. Only happens, worse when trying to write or with action and now in the head. She has not had any migraines.  Anxiety and caffeine in moderation.   Patient complains of symptoms per HPI as well as the following symptoms: tremor . Pertinent negatives and positives per HPI. All others negative   HPI 2019:  Sherry Stafford is a 66 y.o. female here as a referral from Dr. Loyal Buba for headaches.  Past medical history breast cancer, hypertension. Symptoms started with vertigo August of 2017, it was acute, she was walking crooked but she was able to walk over a mile, she was dizzy when she got back home she threw up. Second episode was after she turned, she had spinning, she had to hold onto the car, she couldn't pump her gas, felt like she was being pulled into a vortex and fell. Then the headaaches started. She has a little headache daily, every 6 weeks she has a severe headache, worse coming home from work the car lights would make her eyes ache, sunglasses would help. Headaches are mainly behind the right eye, sometimes pulsating just aches, she has nausea, she has light sensitivy, sleeping helps. No Fhx of migraines. She has had migraines in the past. Vertigo has not happened with the migraines. This happened in the setting of hearing loss. She reports vision changes. She wakes with headache 1-2x a week. Husband says she is a pretty loud snorer. She has gained weight. Headaches can be occipital.   Reviewed notes, labs and imaging from outside physicians,  which showed:  Reviewed referring physician notes.  Patient was referred for multiple issues.  Nasal problems for years.  She has environmental allergies.  She is been on antihistamines and nasal steroid sprays for years.  She has been treated with several rounds of antibiotics.  She has had 2 episodes of dizziness in the last 6 months.  They were fleeting and did not include spinning.  There was no specific change in hearing or tinnitus with the episodes.  She has also been having headaches intermittently located throughout her head.  Her primary care performed an x-ray that she was told the right side was cloudy.  She has seen ENT Dr. Elwyn Reach regarding hearing loss and is using hearing aids from his office now.  She was told to address the sinus and headache issues relative to the vertigo first and then return to him for the vertigo if it seems to be related to the ear.. On my exam, was normal, ear exam was normal.  CT scan showed no evidence of sinusitis.  Vertigo was thought to be related to her ears.  It started after she got her hearing aids.  She also has tinnitus bilaterally.  Review of Systems: Patient complains of symptoms per HPI as well as the following symptoms: headaches, dizzines, pertinent negatives and positives per HPI. All others negative.   Social History   Socioeconomic History   Marital status: Married    Spouse  name: Not on file   Number of children: 2   Years of education: Not on file   Highest education level: High school graduate  Occupational History    Comment: [employer]  Tobacco Use   Smoking status: Never   Smokeless tobacco: Never  Vaping Use   Vaping Use: Never used  Substance and Sexual Activity   Alcohol use: Yes    Alcohol/week: 2.0 standard drinks of alcohol    Types: 2 Glasses of wine per week    Comment: <2 glasses of wine per week    Drug use: No   Sexual activity: Not Currently  Other Topics Concern   Not on file  Social History Narrative   Lives  at home with husband   Right handed   Social Determinants of Health   Financial Resource Strain: Not on file  Food Insecurity: Not on file  Transportation Needs: Not on file  Physical Activity: Not on file  Stress: Not on file  Social Connections: Not on file  Intimate Partner Violence: Not on file    Family History  Problem Relation Age of Onset   Cancer Father        colon mets to lung and brain   Colon cancer Father    Heart disease Mother    Diabetes Mother    Hypertension Mother     Past Medical History:  Diagnosis Date   Anxiety    Arthritis    C5-6- bone spurs- treated by Chiropratic , achiness in hips & legs    Breast cancer ([place])    Depression    GERD (gastroesophageal reflux disease)    Hot flashes    Hyperlipidemia    Hypertension    stress test 5 yrs. ago or > , states it was wnl, no need for cardiac f/u, states her BP is managed by Dr. Wilson Singer   OSA on CPAP     Past Surgical History:  Procedure Laterality Date   APPENDECTOMY     as a teen    blepheroplasy     bilateral    BREAST RECONSTRUCTION  04/09/13   BREAST RECONSTRUCTION WITH PLACEMENT OF TISSUE EXPANDER AND FLEX HD (ACELLULAR HYDRATED DERMIS) Bilateral 10/11/2012   Procedure: BREAST RECONSTRUCTION WITH PLACEMENT OF TISSUE EXPANDER AND FLEX HD (ACELLULAR HYDRATED DERMIS);  Surgeon: Crissie Reese, MD;  Location: [place];  Service: Plastics;  Laterality: Bilateral;   BREAST SURGERY Left 2006   open biopsy (benign), R breast- stereotactic core biopsy- R breast- 07/2012   MASTECTOMY W/ SENTINEL NODE BIOPSY Right 10/11/2012   Procedure:  bilateral MASTECTOMY WITH right  SENTINEL LYMPH NODE BIOPSY;  Surgeon: Odis Hollingshead, MD;  Location: [place];  Service: General;  Laterality: Right;  right nuclear medicine injection 10:45 dr Harlow Mares to follow with reconstruction time alloted    TOTAL MASTECTOMY Left 10/11/2012   Procedure: TOTAL MASTECTOMY;  Surgeon: Odis Hollingshead, MD;  Location: [place];  Service:  General;  Laterality: Left;   TUBAL LIGATION      Current Outpatient Medications  Medication Sig Dispense Refill   primidone (MYSOLINE) 50 MG tablet Take 0.5-1 tablets (25-50 mg total) by mouth at bedtime. 90 tablet 4   amLODipine (NORVASC) 5 MG tablet Take 5 mg by mouth daily with breakfast.      buPROPion (WELLBUTRIN XL) 150 MG 24 hr tablet Take 150 mg by mouth daily with breakfast.      fluticasone (FLONASE) 50 MCG/ACT nasal spray Place 2 sprays into  the nose daily as needed for allergies.      losartan (COZAAR) 25 MG tablet Take by mouth.     LOVAZA 1 G capsule      MAGNESIUM OXIDE PO Take 1 tablet by mouth daily.     omeprazole (PRILOSEC) 40 MG capsule Take 40 mg by mouth as needed.      rOPINIRole (REQUIP) 0.25 MG tablet Take by mouth.     rosuvastatin (CRESTOR) 5 MG tablet Take 5 mg by mouth daily with breakfast.      tiZANidine (ZANAFLEX) 2 MG tablet tizanidine 2 mg tablet  TK 1 T PO TID PRN STOP METAXALONE     traZODone (DESYREL) 50 MG tablet 1 3 TABLET AT BEDTIME AS NEEDED ONCE A DAY ORALLY 30 DAY(S)     TURMERIC PO Take 750 mg by mouth daily.     Ubiquinol 100 MG CAPS Take 100 mg by mouth daily.     VITAMIN D PO Take 1 tablet by mouth daily. Vitamin D3     No current facility-administered medications for this visit.   Facility-Administered Medications Ordered in Other Visits  Medication Dose Route Frequency Provider Last Rate Last Admin   gadopentetate dimeglumine (MAGNEVIST) injection 15 mL  15 mL Intravenous Once PRN Melvenia Beam, MD        Allergies as of 11/16/2021 - Review Complete 11/16/2021  Allergen Reaction Noted   Cefuroxime axetil  02/21/2017    Vitals: BP 122/83   Pulse 77   Ht '5\' 7"'$  (1.702 m)   Wt 176 lb 9.6 oz (80.1 kg)   BMI 27.66 kg/m  Last Weight:  Wt Readings from Last 1 Encounters:  11/16/21 176 lb 9.6 oz (80.1 kg)   Last Height:   Ht Readings from Last 1 Encounters:  11/16/21 '5\' 7"'$  (1.702 m)  Exam: NAD, pleasant                   Speech:    Speech is normal; fluent and spontaneous with normal comprehension.  Cognition:    The patient is oriented to person, place, and time;     recent and remote memory intact;     language fluent;    Cranial Nerves:    The pupils are equal, round, and reactive to light.Trigeminal sensation is intact and the muscles of mastication are normal. The face is symmetric. The palate elevates in the midline. Hearing intact. Voice is normal. Shoulder shrug is normal. The tongue has normal motion without fasciculations.   Coordination:  No dysmetria  Motor Observation:    No asymmetry, no atrophy, mild postural tremor Tone:    Normal muscle tone.  No cogwheeling.   Strength:    Strength is V/V in the upper and lower limbs.      Sensation: intact to LT  Gait normal     Assessment/Plan:  67 year old with benign essential tremor  Can try propranolol short acting '10mg'$  AS NEEDED very low dose, watch BP, for tremors Another option is to decrease one of your other blood pressure meds and give you 60-'80mg'$  of long acting propranolol at bedtime Another option at Bedtime is Gabapentin but is a 3x a day medication and primidone lasts all day Primidone is a great medication for essential tremors, doesn't affect BP,  at bedtime is another option and it helps with sleep so could replace trazodone with low dose of primidone 25-'50mg'$   DECIDED to try primidone, stop trazodone for sleep bc primidone is sedating,  and Dr. Loyal Buba can slowly increase as needed up to '250mg'$  (after '250mg'$  I never see anymore improvement)  Meds ordered this encounter  Medications   primidone (MYSOLINE) 50 MG tablet    Sig: Take 0.5-1 tablets (25-50 mg total) by mouth at bedtime.    Dispense:  90 tablet    Refill:  4   RTC as needed   Sarina Ill, MD  [facility] [address] [place], [place] [address]  Phone [phone] Fax [phone]  I spent 30 minutes of face-to-face and  non-face-to-face time with patient on the  1. Benign essential tremor    diagnosis.  This included previsit chart review, lab review, study review, order entry, electronic health record documentation, patient education on the different diagnostic and therapeutic options, counseling and coordination of care, risks and benefits of management, compliance, or risk factor reduction

## 2021-11-16 NOTE — Patient Instructions (Addendum)
Can try propranolol short acting '10mg'$  AS NEEDED very low dose, watch BP, for tremors Another option is to decrease one of your other blood pressure meds and give you 60-'80mg'$  of long acting propranolol at bedtime Another option at Bedtime is Gabapentin but is a 3x a day medication and primidone lasts all day Primidone is a great medication for essential tremors, doesn't affect BP,  at bedtime is another option and it helps with sleep so could replace trazodone with low dose of primidone 25-'50mg'$   DECIDED to try primidone, stop trazodone for sleep bc primidone is sedating, and Dr. Loyal Buba can slowly increase as needed up to '250mg'$  (after '250mg'$  I never see anymore improvement)  Primidone Tablets What is this medication? PRIMIDONE (PRI mi done) prevents and controls seizures in people with epilepsy. It works by calming overactive nerves in your body. This medicine may be used for other purposes; ask your health care provider or pharmacist if you have questions. COMMON BRAND NAME(S): Mysoline What should I tell my care team before I take this medication? They need to know if you have any of these conditions: Kidney disease Liver disease Porphyria Suicidal thoughts, plans, or attempt by you or a family member An unusual or allergic reaction to primidone, phenobarbital, other barbiturates or seizure medications, other medications, foods, dyes, or preservatives Pregnant or trying to get pregnant Breast-feeding How should I use this medication? Take this medication by mouth with a glass of water. Follow the directions on the prescription label. Take your doses at regular intervals. Do not take your medication more often than directed. Do not stop taking except on the advice of your care team. A special MedGuide will be given to you by the pharmacist with each prescription and refill. Be sure to read this information carefully each time. Contact your care team about the use of this medication in children.  Special care may be needed. While this medication may be prescribed for children for selected conditions, precautions do apply. Overdosage: If you think you have taken too much of this medicine contact a poison control center or emergency room at once. NOTE: This medicine is only for you. Do not share this medicine with others. What if I miss a dose? If you miss a dose, take it as soon as you can. If it is almost time for your next dose, take only that dose. Do not take double or extra doses. What may interact with this medication? Do not take this medication with any of the following: Voriconazole This medication may also interact with the following: Cancer-treating medications Cyclosporine Disopyramide Doxycycline Estrogen or progestin hormones Medications for depression, anxiety or other mental health conditions Medications for treating HIV infection or AIDS Modafinil Prescription pain medications Quinidine Warfarin This list may not describe all possible interactions. Give your health care provider a list of all the medicines, herbs, non-prescription drugs, or dietary supplements you use. Also tell them if you smoke, drink alcohol, or use illegal drugs. Some items may interact with your medicine. What should I watch for while using this medication? Visit your care team for regular checks on your progress. It may be 2 to 3 weeks before you see the full effects of this medication. Do not suddenly stop taking this medication, you may increase the risk of seizures. Your care team may want to gradually reduce the dose. Wear a medical identification bracelet or chain to say you have epilepsy, and carry a card that lists all your medications. You may get  drowsy or dizzy. Do not drive, use machinery, or do anything that needs mental alertness until you know how this medication affects you. Do not stand or sit up quickly, especially if you are an older patient. This reduces the risk of dizzy or  fainting spells. Alcohol may interfere with the effect of this medication. Avoid alcoholic drinks. Birth control pills may not work properly while you are taking this medication. Talk to your care team about using an extra method of birth control. The use of this medication may increase the chance of suicidal thoughts or actions. Pay special attention to how you are responding while on this medication. Any worsening of mood, or thoughts of suicide or dying should be reported to your care team right away. Women who become pregnant while using this medication may enroll in the [place] Pregnancy Registry by calling ([phone]. This registry collects information about the safety of antiepileptic medication use during pregnancy. This medication may cause a decrease in vitamin D and folic acid. You should make sure that you get enough vitamins while you are taking this medication. Discuss the foods you eat and the vitamins you take with your care team. What side effects may I notice from receiving this medication? Side effects that you should report to your care team as soon as possible: Allergic reactions--skin rash, itching, hives, swelling of the face, lips, tongue, or throat CNS depression--slow or shallow breathing, shortness of breath, feeling faint, dizziness, confusion, trouble staying awake Thoughts of suicide or self-harm, worsening mood, or feelings of depression Side effects that usually do not require medical attention (report to your care team if they continue or are bothersome): Dizziness Drowsiness Loss of balance or coordination Nausea This list may not describe all possible side effects. Call your doctor for medical advice about side effects. You may report side effects to FDA at 1-800-FDA-1088. Where should I keep my medication? Keep out of the reach of children and pets. This medication may cause accidental overdose and death if it is taken by other  adults, children, or pets. Mix any unused medication with a substance like cat litter or coffee grounds. Then throw the medication away in a sealed container like a sealed bag or a coffee can with a lid. Do not use the medication after the expiration date. Store at room temperature between 15 and 30 degrees C (59 and 86 degrees F). NOTE: This sheet is a summary. It may not cover all possible information. If you have questions about this medicine, talk to your doctor, pharmacist, or health care provider.  2023 Elsevier/Gold Standard (2020-11-06 00:00:00)

## 2022-05-23 ENCOUNTER — Encounter: Payer: Self-pay | Admitting: *Deleted

## 2022-05-23 NOTE — Progress Notes (Unsigned)
PATIENT: Sherry Stafford DOB: [birth year]  REASON FOR VISIT: follow up HISTORY FROM: patient PRIMARY NEUROLOGIST:   Virtual Visit via Video Note  I connected with Erline Hau on 05/24/22 at  2:15 PM EST by a video enabled telemedicine application located remotely at [facility] and verified that I am speaking with the correct person using two identifiers who was located at their own home.   I discussed the limitations of evaluation and management by telemedicine and the availability of in person appointments. The patient expressed understanding and agreed to proceed.   PATIENT: Sherry Stafford DOB: 02-06-1956  REASON FOR VISIT: follow up HISTORY FROM: patient  HISTORY OF PRESENT ILLNESS: Today 05/24/22:  Sherry Stafford is a 67 y.o. female with a history of obstructive sleep apnea on CPAP. Returns today for follow-up.  Reports that CPAP is working well.  Denies any new symptoms.  Reports that she changes out her supplies regularly.  Feels that she may need a medium size mask  Patient saw Dr. Lucia Gaskins for essential tremor.  She has been taking primidone 25 mg at bedtime.  She is not sure how much it helps with the tremor.  As she often wakes up and feels the tremor in her neck.  She states that she tried doing the 50 mg however she felt more fatigued with that dosage.       05/18/21: [name] is a 67 year old female with a history of obstructive sleep apnea on CPAP.  She reports that CPAP is working well for her.  Download is below    REVIEW OF SYSTEMS: Out of a complete 14 system review of symptoms, the patient complains only of the following symptoms, and all other reviewed systems are negative.  ALLERGIES: Allergies  Allergen Reactions   Cefuroxime Axetil     Other reaction(s): yeast infection    HOME MEDICATIONS: Outpatient Medications Prior to Visit  Medication Sig Dispense Refill   amLODipine (NORVASC) 5 MG tablet Take 5 mg by mouth daily with  breakfast.      buPROPion (WELLBUTRIN XL) 150 MG 24 hr tablet Take 150 mg by mouth daily with breakfast.      fluticasone (FLONASE) 50 MCG/ACT nasal spray Place 2 sprays into the nose daily as needed for allergies.      losartan (COZAAR) 25 MG tablet Take by mouth.     LOVAZA 1 G capsule      MAGNESIUM OXIDE PO Take 1 tablet by mouth daily.     omeprazole (PRILOSEC) 40 MG capsule Take 40 mg by mouth as needed.      primidone (MYSOLINE) 50 MG tablet Take 0.5-1 tablets (25-50 mg total) by mouth at bedtime. 90 tablet 4   rOPINIRole (REQUIP) 0.25 MG tablet Take by mouth.     rosuvastatin (CRESTOR) 5 MG tablet Take 5 mg by mouth daily with breakfast.      tiZANidine (ZANAFLEX) 2 MG tablet tizanidine 2 mg tablet  TK 1 T PO TID PRN STOP METAXALONE     traZODone (DESYREL) 50 MG tablet 1 3 TABLET AT BEDTIME AS NEEDED ONCE A DAY ORALLY 30 DAY(S)     TURMERIC PO Take 750 mg by mouth daily.     Ubiquinol 100 MG CAPS Take 100 mg by mouth daily.     VITAMIN D PO Take 1 tablet by mouth daily. Vitamin D3     Facility-Administered Medications Prior to Visit  Medication Dose Route Frequency Provider Last Rate  Last Admin   gadopentetate dimeglumine (MAGNEVIST) injection 15 mL  15 mL Intravenous Once PRN Anson Fret, MD        PAST MEDICAL HISTORY: Past Medical History:  Diagnosis Date   Anxiety    Arthritis    C5-6- bone spurs- treated by Chiropratic , achiness in hips & legs    Breast cancer (HCC)    Depression    GERD (gastroesophageal reflux disease)    Hot flashes    Hyperlipidemia    Hypertension    stress test 5 yrs. ago or > , states it was wnl, no need for cardiac f/u, states her BP is managed by Dr. Juleen China   OSA on CPAP     PAST SURGICAL HISTORY: Past Surgical History:  Procedure Laterality Date   APPENDECTOMY     as a teen    blepheroplasy     bilateral    BREAST RECONSTRUCTION  04/09/13   BREAST RECONSTRUCTION WITH PLACEMENT OF TISSUE EXPANDER AND FLEX HD (ACELLULAR HYDRATED  DERMIS) Bilateral 10/11/2012   Procedure: BREAST RECONSTRUCTION WITH PLACEMENT OF TISSUE EXPANDER AND FLEX HD (ACELLULAR HYDRATED DERMIS);  Surgeon: Etter Sjogren, MD;  Location: [facility];  Service: Plastics;  Laterality: Bilateral;   BREAST SURGERY Left 2006   open biopsy (benign), R breast- stereotactic core biopsy- R breast- 07/2012   MASTECTOMY W/ SENTINEL NODE BIOPSY Right 10/11/2012   Procedure:  bilateral MASTECTOMY WITH right  SENTINEL LYMPH NODE BIOPSY;  Surgeon: Adolph Pollack, MD;  Location: [facility] OR;  Service: General;  Laterality: Right;  right nuclear medicine injection 10:45 dr Odis Luster to follow with reconstruction time alloted    TOTAL MASTECTOMY Left 10/11/2012   Procedure: TOTAL MASTECTOMY;  Surgeon: Adolph Pollack, MD;  Location: [facility] OR;  Service: General;  Laterality: Left;   TUBAL LIGATION      FAMILY HISTORY: Family History  Problem Relation Age of Onset   Cancer Father        colon mets to lung and brain   Colon cancer Father    Heart disease Mother    Diabetes Mother    Hypertension Mother     SOCIAL HISTORY: Social History   Socioeconomic History   Marital status: Married    Spouse name: Not on file   Number of children: 2   Years of education: Not on file   Highest education level: High school graduate  Occupational History    Comment: [employer]  Tobacco Use   Smoking status: Never   Smokeless tobacco: Never  Vaping Use   Vaping Use: Never used  Substance and Sexual Activity   Alcohol use: Yes    Alcohol/week: 2.0 standard drinks of alcohol    Types: 2 Glasses of wine per week    Comment: <2 glasses of wine per week    Drug use: No   Sexual activity: Not Currently  Other Topics Concern   Not on file  Social History Narrative   Lives at home with husband   Right handed   Social Determinants of Health   Financial Resource Strain: Not on file  Food Insecurity: Not on file  Transportation Needs: Not on file  Physical Activity: Not on file   Stress: Not on file  Social Connections: Not on file  Intimate Partner Violence: Not on file      PHYSICAL EXAM Generalized: Well developed, in no acute distress   Neurological examination  Mentation: Alert oriented to time, place, history taking. Follows all commands  speech and language fluent Cranial nerve II-XII:Extraocular movements were full. Facial symmetry noted. uvula tongue midline. Head turning and shoulder shrug  were normal and symmetric. Motor: Good strength throughout subjectively per patient Sensory: Sensory testing is intact to soft touch on all 4 extremities subjectively per patient Coordination: Cerebellar testing reveals good finger-nose-finger  Gait and station: Patient is able to stand from a seated position. gait is normal.  Reflexes: UTA  DIAGNOSTIC DATA (LABS, IMAGING, TESTING) - I reviewed patient records, labs, notes, testing and imaging myself where available.  Lab Results  Component Value Date   WBC 4.2 03/25/2014   HGB 13.6 03/25/2014   HCT 42.0 03/25/2014   MCV 92.6 03/25/2014   PLT 221 03/25/2014      Component Value Date/Time   NA 146 (H) 05/12/2017 0915   NA 143 03/25/2014 1033   K 3.9 05/12/2017 0915   K 4.1 03/25/2014 1033   CL 107 (H) 05/12/2017 0915   CO2 22 05/12/2017 0915   CO2 26 03/25/2014 1033   GLUCOSE 93 05/12/2017 0915   GLUCOSE 84 03/25/2014 1033   BUN 16 05/12/2017 0915   BUN 16.5 03/25/2014 1033   CREATININE 1.00 05/12/2017 0915   CREATININE 1.0 03/25/2014 1033   CALCIUM 10.0 05/12/2017 0915   CALCIUM 9.5 03/25/2014 1033   PROT 6.6 03/25/2014 1033   ALBUMIN 3.6 03/25/2014 1033   AST 15 03/25/2014 1033   ALT 13 03/25/2014 1033   ALKPHOS 39 (L) 03/25/2014 1033   BILITOT 0.22 03/25/2014 1033   GFRNONAA 60 05/12/2017 0915   GFRAA 69 05/12/2017 0915    Lab Results  Component Value Date   TSH 3.570 05/12/2017      ASSESSMENT AND PLAN 67 y.o. year old female  has a past medical history of Anxiety, Arthritis,  Breast cancer (HCC), Depression, GERD (gastroesophageal reflux disease), Hot flashes, Hyperlipidemia, Hypertension, and OSA on CPAP. here with:  OSA on CPAP Essential tremor  CPAP compliance excellent Residual AHI is good Encouraged patient to continue using CPAP nightly and > 4 hours each night Advised that she can try taking primidone 50 mg for several nights consecutively to see if fatigue improves as she adjusts to a higher dose.  If she is unable to tolerate the medication or she does not find it beneficial she should let us know F/U in 1 year or sooner if needed  I Butch Penny, MSN, NP-C 05/24/2022, 2:12 PM [facility] [address] [place], [place] [address] [phone]

## 2022-05-24 ENCOUNTER — Telehealth (INDEPENDENT_AMBULATORY_CARE_PROVIDER_SITE_OTHER): Payer: Medicare Other | Admitting: Adult Health

## 2022-05-24 DIAGNOSIS — G4733 Obstructive sleep apnea (adult) (pediatric): Secondary | ICD-10-CM

## 2022-05-24 DIAGNOSIS — G250 Essential tremor: Secondary | ICD-10-CM | POA: Diagnosis not present

## 2022-10-21 DIAGNOSIS — D1621 Benign neoplasm of long bones of right lower limb: Secondary | ICD-10-CM | POA: Insufficient documentation

## 2022-11-11 ENCOUNTER — Telehealth (INDEPENDENT_AMBULATORY_CARE_PROVIDER_SITE_OTHER): Payer: Medicare Other | Admitting: Adult Health

## 2022-11-11 DIAGNOSIS — G4733 Obstructive sleep apnea (adult) (pediatric): Secondary | ICD-10-CM | POA: Diagnosis not present

## 2022-11-11 DIAGNOSIS — G250 Essential tremor: Secondary | ICD-10-CM

## 2022-11-11 MED ORDER — PRIMIDONE 50 MG PO TABS: 50.0000 mg | ORAL_TABLET | Freq: Every day | ORAL | 4 refills | Status: DC

## 2022-11-11 NOTE — Progress Notes (Signed)
PATIENT: Sherry Stafford DOB: January 10, 1956  REASON FOR VISIT: follow up HISTORY FROM: patient PRIMARY NEUROLOGIST:   Virtual Visit via Video Note  I connected with Erline Hau on 11/11/22 at 10:00 AM EDT by a video enabled telemedicine application located remotely at [facility] and verified that I am speaking with the correct person using two identifiers who was located at their own home.   I discussed the limitations of evaluation and management by telemedicine and the availability of in person appointments. The patient expressed understanding and agreed to proceed.   PATIENT: Sherry Stafford DOB: 09-13-1955  REASON FOR VISIT: follow up HISTORY FROM: patient  HISTORY OF PRESENT ILLNESS: Today 11/11/22:  Sherry Stafford is a 67 y.o. female with a history of obstructive sleep apnea on CPAP and essential tremor. Returns today for follow-up.  She reports that the CPAP is working well for her.  She denies any new symptoms.  She states that the DME company keeps in her a small mask but she needs a size medium.  A new order will be sent today.  At the last visit primidone was increased to 50 mg at bedtime.  She states this has been working well for her.  Denies any new symptoms.     05/24/22:[name] is a 67 y.o. female with a history of obstructive sleep apnea on CPAP. Returns today for follow-up.  Reports that CPAP is working well.  Denies any new symptoms.  Reports that she changes out her supplies regularly.  Feels that she may need a medium size mask  Patient saw Dr. Lucia Gaskins for essential tremor.  She has been taking primidone 25 mg at bedtime.  She is not sure how much it helps with the tremor.  As she often wakes up and feels the tremor in her neck.  She states that she tried doing the 50 mg however she felt more fatigued with that dosage.       05/18/21: Sherry Stafford is a 67 year old female with a history of obstructive sleep apnea on CPAP.  She reports that CPAP is  working well for her.  Download is below    REVIEW OF SYSTEMS: Out of a complete 14 system review of symptoms, the patient complains only of the following symptoms, and all other reviewed systems are negative.  ALLERGIES: Allergies  Allergen Reactions   Cefuroxime Axetil     Other reaction(s): yeast infection    HOME MEDICATIONS: Outpatient Medications Prior to Visit  Medication Sig Dispense Refill   amLODipine (NORVASC) 5 MG tablet Take 5 mg by mouth daily with breakfast.      buPROPion (WELLBUTRIN XL) 150 MG 24 hr tablet Take 150 mg by mouth daily with breakfast.      fluticasone (FLONASE) 50 MCG/ACT nasal spray Place 2 sprays into the nose daily as needed for allergies.      losartan (COZAAR) 25 MG tablet Take by mouth.     LOVAZA 1 G capsule      MAGNESIUM OXIDE PO Take 1 tablet by mouth daily.     omeprazole (PRILOSEC) 40 MG capsule Take 40 mg by mouth as needed.      primidone (MYSOLINE) 50 MG tablet Take 0.5-1 tablets (25-50 mg total) by mouth at bedtime. 90 tablet 4   rOPINIRole (REQUIP) 0.25 MG tablet Take by mouth.     rosuvastatin (CRESTOR) 5 MG tablet Take 5 mg by mouth daily with breakfast.  tiZANidine (ZANAFLEX) 2 MG tablet tizanidine 2 mg tablet  TK 1 T PO TID PRN STOP METAXALONE     TURMERIC PO Take 750 mg by mouth daily.     Ubiquinol 100 MG CAPS Take 100 mg by mouth daily.     VITAMIN D PO Take 1 tablet by mouth daily. 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 ago or > , states it was wnl, no need for cardiac f/u, states her BP is managed by Dr. Juleen China   OSA on CPAP     PAST SURGICAL HISTORY: Past Surgical History:  Procedure Laterality Date   APPENDECTOMY     as a teen    blepheroplasy     bilateral    BREAST RECONSTRUCTION  04/09/13   BREAST RECONSTRUCTION WITH PLACEMENT OF TISSUE EXPANDER AND FLEX HD (ACELLULAR HYDRATED DERMIS) Bilateral 10/11/2012   Procedure: BREAST RECONSTRUCTION WITH PLACEMENT OF TISSUE EXPANDER AND FLEX HD (ACELLULAR HYDRATED DERMIS);  Surgeon: Etter Sjogren, MD;  Location: [facility] OR;  Service: Plastics;  Laterality: Bilateral;   BREAST SURGERY Left 2006   open biopsy (benign), R breast- stereotactic core biopsy- R breast- 07/2012   MASTECTOMY W/ SENTINEL NODE BIOPSY Right 10/11/2012   Procedure:  bilateral MASTECTOMY WITH right  SENTINEL LYMPH NODE BIOPSY;  Surgeon: Adolph Pollack, MD;  Location: [facility] OR;  Service: General;  Laterality: Right;  right nuclear medicine injection 10:45 dr Odis Luster to follow with reconstruction time alloted    TOTAL MASTECTOMY Left 10/11/2012   Procedure: TOTAL MASTECTOMY;  Surgeon: Adolph Pollack, MD;  Location: [facility] OR;  Service: General;  Laterality: Left;   TUBAL LIGATION      FAMILY HISTORY: Family History  Problem Relation Age of Onset   Cancer Father        colon mets to lung and brain   Colon cancer Father    Heart disease Mother    Diabetes Mother    Hypertension Mother     SOCIAL HISTORY: Social History   Socioeconomic History   Marital status: Married    Spouse name: Not on file   Number of children: 2   Years of education: Not on file   Highest education level: High school graduate  Occupational History    Comment: [employer]  Tobacco Use   Smoking status: Never   Smokeless tobacco: Never  Vaping Use   Vaping status: Never Used  Substance and Sexual Activity   Alcohol use: Yes    Alcohol/week: 2.0 standard drinks of alcohol    Types: 2 Glasses of wine per week     Comment: <2 glasses of wine per week    Drug use: No   Sexual activity: Not Currently  Other Topics Concern   Not on file  Social History Narrative   Lives at home with husband   Right handed   Social Determinants  of Health   Financial Resource Strain: Low Risk  (03/30/2022)   Received from [facility], [facility]   Overall Financial Resource Strain (CARDIA)    Difficulty of Paying Living Expenses: Not hard at all  Food Insecurity: No Food Insecurity (03/30/2022)   Received from [facility], [facility]   Hunger Vital Sign    Worried About Running Out of Food in the Last Year: Never true    Ran Out of Food in the Last Year: Never true  Transportation Needs: No Transportation Needs (03/30/2022)   Received from [facility], [facility]   PRAPARE - Transportation    Lack of Transportation (Medical): No    Lack of Transportation (Non-Medical): No  Physical Activity: Insufficiently Active (03/30/2022)   Received from [facility], [facility]   Exercise Vital Sign    Days of Exercise per Week: 2 days    Minutes of Exercise per Session: 20 min  Stress: No Stress Concern Present (03/30/2022)   Received from [facility], [facility] - Occupational Stress Questionnaire    Feeling of Stress : Only a little  Social Connections: Socially Integrated (03/30/2022)   Received from [facility], [facility]   Social Network    How would you rate your social network (family, work, friends)?: Good participation with social networks  Intimate Partner Violence: Not At Risk (03/30/2022)   Received from [facility], [facility]   HITS    Over the last 12 months how often did your partner physically hurt you?: 1    Over the last 12 months how often did your partner insult you or talk down to you?: 2    Over the last 12 months how often did your partner threaten you with physical harm?: 1    Over the last 12 months how often did your  partner scream or curse at you?: 2      PHYSICAL EXAM Generalized: Well developed, in no acute distress   Neurological examination  Mentation: Alert oriented to time, place, history taking. Follows all commands speech and language fluent Cranial nerve II-XII: Facial symmetry noted DIAGNOSTIC DATA (LABS, IMAGING, TESTING) - I reviewed patient records, labs, notes, testing and imaging myself where available.  Lab Results  Component Value Date   WBC 4.2 03/25/2014   HGB 13.6 03/25/2014   HCT 42.0 03/25/2014   MCV 92.6 03/25/2014   PLT 221 03/25/2014      Component Value Date/Time   NA 146 (H) 05/12/2017 0915   NA 143 03/25/2014 1033   K 3.9 05/12/2017 0915   K 4.1 03/25/2014 1033   CL 107 (H) 05/12/2017 0915   CO2 22 05/12/2017 0915   CO2 26 03/25/2014 1033   GLUCOSE 93 05/12/2017 0915   GLUCOSE 84 03/25/2014 1033   BUN 16 05/12/2017 0915   BUN 16.5 03/25/2014 1033   CREATININE 1.00 05/12/2017 0915   CREATININE 1.0 03/25/2014 1033   CALCIUM 10.0 05/12/2017 0915   CALCIUM 9.5 03/25/2014 1033   PROT 6.6 03/25/2014 1033   ALBUMIN 3.6 03/25/2014 1033   AST 15 03/25/2014 1033   ALT 13 03/25/2014 1033   ALKPHOS 39 (L) 03/25/2014 1033   BILITOT 0.22 03/25/2014 1033   GFRNONAA 60 05/12/2017 0915   GFRAA 69 05/12/2017 0915    Lab Results  Component Value Date   TSH 3.570 05/12/2017      ASSESSMENT AND PLAN 67 y.o. year old female  has a past medical  history of Anxiety, Arthritis, Breast cancer (HCC), Depression, GERD (gastroesophageal reflux disease), Hot flashes, Hyperlipidemia, Hypertension, and OSA on CPAP. here with:  OSA on CPAP Essential tremor  CPAP compliance excellent Residual AHI is good Encouraged patient to continue using CPAP nightly and > 4 hours each night Continue primidone 50 mg at bedtime F/U in 1 year or sooner if needed  I Butch Penny, MSN, NP-C 11/11/2022, 10:55 AM [facility] [address], [address] [place],  [place] [address] [phone]

## 2023-03-31 DIAGNOSIS — R0609 Other forms of dyspnea: Secondary | ICD-10-CM | POA: Insufficient documentation

## 2023-04-06 DIAGNOSIS — I722 Aneurysm of renal artery: Secondary | ICD-10-CM | POA: Insufficient documentation

## 2023-04-06 DIAGNOSIS — I728 Aneurysm of other specified arteries: Secondary | ICD-10-CM | POA: Insufficient documentation

## 2023-06-19 ENCOUNTER — Telehealth: Payer: Self-pay | Admitting: Adult Health

## 2023-06-19 NOTE — Telephone Encounter (Signed)
 At 11:24 Rebecca@ [employer](option #6) left a vm asking if there is an order on file for a CPAP with settings that it be faxed to them at [phone].  Their phone# to call is [phone] option 6

## 2023-06-19 NOTE — Telephone Encounter (Signed)
 I called pt, LMVM for her regarding information on VM that received from [employer] a dn ew cpap machine.  Wanted to speak with her prior to calling this back.

## 2023-06-20 NOTE — Telephone Encounter (Signed)
 Pt returned call.  She said that she is getting replacement machine (travel machine) [employer] and they need order relating to her set pressures.  I relayed will fax to them.  Appreciated her call back.

## 2023-06-21 NOTE — Telephone Encounter (Signed)
 I have faxed the order that was done 2021 tor travel cpap and demographics. To Sherry Stafford at [employer]. fax [phone], [phone].  Received confirmation back 4 pgs.

## 2023-07-17 ENCOUNTER — Telehealth: Payer: Self-pay | Admitting: Adult Health

## 2023-07-17 DIAGNOSIS — G250 Essential tremor: Secondary | ICD-10-CM

## 2023-07-17 DIAGNOSIS — Z789 Other specified health status: Secondary | ICD-10-CM

## 2023-07-17 DIAGNOSIS — G4733 Obstructive sleep apnea (adult) (pediatric): Secondary | ICD-10-CM

## 2023-07-17 NOTE — Telephone Encounter (Signed)
 I called pt.  She is using autopap 7-14 cm and states that she feels that she is not getting enough air.  Mask fit she says its ok.  Her travel cpap ([facility]) is at 10cm and feels like she is getting too much pressure.  (Set at 10cm) from last order-2021.  I did DL for autopap, was not able to DL PCO cpap.  She was last seen 10-2022 and her annual appt is 10-2023.

## 2023-07-17 NOTE — Telephone Encounter (Signed)
 Pt called stating that she is needing an increase in pressure on her cpap machine. It is set at a 7 at the moment. She also stated that she is needing an increase on her travel Cpap machine which is set at a 10 and she states that it is to high for her. Please advise.

## 2023-07-17 NOTE — Telephone Encounter (Signed)
 Sherry Stafford

## 2023-07-17 NOTE — Telephone Encounter (Signed)
 Pt last order for travel cpap was at 10cm.

## 2023-07-17 NOTE — Telephone Encounter (Signed)
 Travel CPAP uncomfortable at 10 cm water pressure, lets change to 8 cm water.    Auto CPAP looks fine but has central residual apneas.  Can we set 5-10 cm water, 2 cm EPR and order a new baseline watch pat test ? HST , please.   Neomia Banner, MD  [facility] and [facility] Board certified by The [employer] of Sleep Medicine and Diplomate of the [employer] of Sleep Medicine. Board certified In Neurology through the ABPN, Fellow of the [employer] of Neurology.

## 2023-07-18 NOTE — Telephone Encounter (Signed)
 I called pt and let her know that Dr. Albertina Hugger looked at her message as Jacqlyn Matas NP was out of the office.  I relayed that Dr. Albertina Hugger recommended to change autopap pressure 5-10cm 2cm EPR, along with doing HST.  The travel cpap [facility] change fax Glorianne Largo at [facility] [phone]  8cm h20.  Pt verbalized understanding.  Fax confirmation received.  Sent community message about change to [employer].  (I made changes remotely on airview).  Pt will let us  know if any other concerns.

## 2023-07-19 NOTE — Telephone Encounter (Signed)
 RE: autopap pressure changes  (already done in [place])  [place] Received: Today New, Fabiene Holt, RN; Ladene Pickle; Tucker, Dolanda; 1 other Received, Thank you!     Previous Messages    ----- Message ----- From: Viktoria Gray, RN Sent: 07/18/2023   9:13 AM EDT To: Osie Bleacher; Shirline Dover; Leana Printers; * Subject: autopap pressure changes  (already done in a*  Good morning,  I have made changes in [place] on pt, Sherry Stafford Female, 68 y.o., Feb 14, 1956 MRN: [id number] Phone: [phone]  Thank you  Francisco Irving

## 2023-08-08 ENCOUNTER — Ambulatory Visit (INDEPENDENT_AMBULATORY_CARE_PROVIDER_SITE_OTHER): Admitting: Neurology

## 2023-08-08 DIAGNOSIS — G250 Essential tremor: Secondary | ICD-10-CM

## 2023-08-08 DIAGNOSIS — Z789 Other specified health status: Secondary | ICD-10-CM

## 2023-08-08 DIAGNOSIS — G4733 Obstructive sleep apnea (adult) (pediatric): Secondary | ICD-10-CM | POA: Diagnosis not present

## 2023-08-10 NOTE — Progress Notes (Unsigned)
 Sherry Stafford

## 2023-08-11 ENCOUNTER — Ambulatory Visit: Payer: Self-pay | Admitting: Neurology

## 2023-08-11 NOTE — Procedures (Signed)
 [facility]  Sherry Stafford 68 year old female [birth year]   HOME SLEEP TEST REPORT ( by Watch PAT)   STUDY DATE: 08-08-2023 data load on 06-10-2023    ORDERING CLINICIAN: Neomia Banner, MD  REFERRING CLINICIAN: Clem Currier, NP  PCP : Bonne Buster, MD    CLINICAL INFORMATION/HISTORY: MM, NP 11/11/22:   Sherry Stafford is a 68 y.o. female with a history of obstructive sleep apnea on CPAP and essential tremor. Seen in Video Virtual visit.  She reports that the CPAP is working well for her.  She denies any new symptoms.  She states that the DME company keeps in her a small mask but she needs a size medium.  A new order will be sent today.  At the last visit primidone  was increased to 50 mg at bedtime.  She states this has been working well for her.  Denies any new symptoms. Setting is 7-14 cm wate with 3 cm EPR and residual AHI was 1.3/h.  95% 9.5 cm water pressure.    Last in office visit was with Dr Tresia Fruit for headache, 11-16-21.  Epworth sleepiness score: /24.   BMI:27.7 in 2023   Neck Circumference: N/A   FINDINGS:   Sleep Summary:   Total Recording Time (hours, min): 9 hours 58 minutes      Total Sleep Time (hours, min): 9 hours 9 minutes               Percent REM (%):   19.2%                                     Respiratory Indices:   Calculated pAHI (per  CMS guideline): 34.3/h                        REM pAHI: 56.3/h                                               NREM pAHI: 29.1/h                            Positional AHI:   The majority of sleep was recorded in supine position with 361 minutes and an AHI of 40.7/h nonsupine sleep was associated with an AHI of 13.2/h following CMS criteria of scoring. Of the non- supine sleep positions the lowest AHI was seen in prone sleep 4.2/h.  Snoring: Mean volume was only 41 dB which is mild and snoring was present for less than 20% of total recorded sleep time.                                                Oxygen Saturation Statistics:   Oxygen Saturation (%) Mean:   92%             O2 Saturation Range (%):    Between the nadir of 81% and a maximal saturation of 98% oxygen  O2 Saturation (minutes) <89%: 10.6 minutes, this amounted to circa 2% total sleep time.         Pulse Rate Statistics:   Pulse Mean (bpm): 77 bpm                Pulse Range:     Between 58 and 113 bpm.            IMPRESSION:  This HST confirms the presence of all obstructive severe sleep apnea at an AHI of 34.3.  REM sleep apnea and hypopnea was nearly double as frequent as respiratory events in non-REM sleep and there was mild oxygen desaturation associated.  The patient's BMI was based on one of her last visits in the office and at the time 27.7 but may have changed in the interval.  Weight loss will not be of major benefit for patient with this BMI. There was however a benefit seen by sleeping not on the back but prone or on the side and this reduced the apnea to a mild degree.     RECOMMENDATION: This obstructive apnea patient should definitely continue positive airway pressure therapy.  In addition, I recommend  to avoid sleeping on her back if possible.   Her new device will be set similar to her previous device between 7 and 14 cm water pressure with 3 cm expiratory pressure relief, she can continue the mask of her choice.  Heated humidification will be included.    INTERPRETING PHYSICIAN:  Neomia Banner, MD  [facility] and [facility] Board certified by The [employer] of Sleep Medicine and Diplomate of the [employer] of Sleep Medicine. Board certified In Neurology through the ABPN, Fellow of the [employer] of Neu[employer].

## 2023-08-14 NOTE — Telephone Encounter (Signed)
 Called patient to discuss sleep study results. No answer at this time. LVM for the patient to call back.  Will send a mychart message as well.

## 2023-08-14 NOTE — Telephone Encounter (Signed)
 Pt returned call and I was able to review the results for the patient and advised that the order will be sent to [facility] for her. Order sent for the patient to [facility]. She has a schedule apt in aug with Megan Millikan, NP. Informed the pt that apt time will likely be within the 31-90 day window.

## 2023-08-14 NOTE — Telephone Encounter (Signed)
 Sherry Stafford

## 2023-08-14 NOTE — Telephone Encounter (Signed)
-----   Message from Basalt Dohmeier sent at 08/11/2023  2:04 PM EDT -----  This HST confirms the presence of all obstructive severe sleep apnea at an AHI of 34.3/h.  REM sleep apnea / hypopnea was nearly double as frequent as respiratory events in non-REM sleep and there was oxygen desaturation associated. Position of sleep influenced the AHI - avoiding supine sleep will reduce the severity of apnea to a mild degree. There was mild and intermittent snoring .  This degree of REM sleep exacerbated OSA with ( mild)  hypoxia is not treatable by a dental device or hypoglossal CN stimulation. Snoring  is not the problem and this patient has REM sleep hypoxia with a fairly normal BMI, needs additional PAP therapy  to ventilate  and not therapy to open the upper airway alone.  I wrote for DME order.

## 2023-08-29 DIAGNOSIS — M5136 Other intervertebral disc degeneration, lumbar region with discogenic back pain only: Secondary | ICD-10-CM | POA: Insufficient documentation

## 2023-10-26 ENCOUNTER — Telehealth: Payer: Self-pay | Admitting: Adult Health

## 2023-10-26 NOTE — Telephone Encounter (Signed)
 Pt made request to r/s due to not being in [place]

## 2023-11-07 ENCOUNTER — Telehealth: Payer: Medicare Other | Admitting: Adult Health

## 2023-11-30 ENCOUNTER — Other Ambulatory Visit: Payer: Self-pay | Admitting: Adult Health

## 2024-01-03 ENCOUNTER — Encounter: Payer: Self-pay | Admitting: Neurology

## 2024-01-11 NOTE — Progress Notes (Signed)
 SABRA

## 2024-01-15 ENCOUNTER — Encounter: Payer: Self-pay | Admitting: Neurology

## 2024-01-15 ENCOUNTER — Ambulatory Visit (INDEPENDENT_AMBULATORY_CARE_PROVIDER_SITE_OTHER): Admitting: Neurology

## 2024-01-15 VITALS — BP 133/87 | HR 75 | Ht 67.0 in | Wt 179.0 lb

## 2024-01-15 DIAGNOSIS — G4733 Obstructive sleep apnea (adult) (pediatric): Secondary | ICD-10-CM

## 2024-01-15 DIAGNOSIS — H9313 Tinnitus, bilateral: Secondary | ICD-10-CM | POA: Insufficient documentation

## 2024-01-15 DIAGNOSIS — R251 Tremor, unspecified: Secondary | ICD-10-CM | POA: Diagnosis not present

## 2024-01-15 DIAGNOSIS — H903 Sensorineural hearing loss, bilateral: Secondary | ICD-10-CM | POA: Insufficient documentation

## 2024-01-15 MED ORDER — PRIMIDONE 50 MG PO TABS
50.0000 mg | ORAL_TABLET | Freq: Two times a day (BID) | ORAL | 3 refills | Status: AC
Start: 2024-01-15 — End: ?
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
## 2024-01-15 NOTE — Patient Instructions (Signed)
 Primidone  Tablets What is this medication? PRIMIDONE  (PRI mi done) prevents and controls seizures in people with epilepsy. It works by calming overactive nerves in your body. This medicine may be used for other purposes; ask your health care provider or pharmacist if you have questions. COMMON BRAND NAME(S): Mysoline  What should I tell my care team before I take this medication? They need to know if you have any of these conditions: Kidney disease Liver disease Porphyria Suicidal thoughts, plans, or attempt by you or a family member An unusual or allergic reaction to primidone , phenobarbital, other barbiturates or seizure medications, other medications, foods, dyes, or preservatives Pregnant or trying to get pregnant Breast-feeding How should I use this medication? Take this medication by mouth with a glass of water. Follow the directions on the prescription label. Take your doses at regular intervals. Do not take your medication more often than directed. Do not stop taking except on the advice of your care team. A special MedGuide will be given to you by the pharmacist with each prescription and refill. Be sure to read this information carefully each time. Contact your care team about the use of this medication in children. Special care may be needed. While this medication may be prescribed for children for selected conditions, precautions do apply. Overdosage: If you think you have taken too much of this medicine contact a poison control center or emergency room at once. NOTE: This medicine is only for you. Do not share this medicine with others. What if I miss a dose? If you miss a dose, take it as soon as you can. If it is almost time for your next dose, take only that dose. Do not take double or extra doses. What may interact with this medication? Do not take this medication with any of the following: Voriconazole This medication may also interact with the following: Antivirals for HIV  or AIDS Cyclosporine Disopyramide Doxycycline Estrogen and progestin hormones Medications for cancer Medications for depression, anxiety, or other mental health conditions Modafinil Prescription pain medications Quinidine Warfarin This list may not describe all possible interactions. Give your health care provider a list of all the medicines, herbs, non-prescription drugs, or dietary supplements you use. Also tell them if you smoke, drink alcohol, or use illegal drugs. Some items may interact with your medicine. What should I watch for while using this medication? Visit your care team for regular checks on your progress. It may be 2 to 3 weeks before you see the full effects of this medication. Do not suddenly stop taking this medication. You may increase the risk of seizures. Your care team will tell you how much medication to take. If your care team wants you to stop the medication, the dose may be slowly lowered over time. Wear a medical ID bracelet or chain to say you have epilepsy. Carry a card that describes your condition. List the medications and doses you take on the card. This medication may affect your coordination, reaction time, or judgment. Do not drive or operate machinery until you know how this medication affects you. Sit up or stand slowly to reduce the risk of dizzy or fainting spells. Drinking alcohol with this medication can increase the risk of these side effects. Estrogen and progestin hormones may not work as well while you are taking this medication. Your care team can help you find the contraceptive option that works for you. This medication may cause thoughts of suicide or depression. This includes sudden changes in mood, behaviors,  or thoughts. These changes can happen at any time but are more common in the beginning of treatment or after a change in dose. Call your care team right away if you experience these thoughts or worsening depression. People who become pregnant  while using this medication may enroll in the [place] American Antiepileptic Drug Pregnancy Registry by calling [phone]. This registry collects information about the safety of antiepileptic medication use during pregnancy. This medication may cause a decrease in vitamin D and folic acid. You should make sure that you get enough vitamins while you are taking this medication. Discuss the foods you eat and the vitamins you take with your care team. What side effects may I notice from receiving this medication? Side effects that you should report to your care team as soon as possible: Allergic reactions--skin rash, itching, hives, swelling of the face, lips, tongue, or throat CNS depression--slow or shallow breathing, shortness of breath, feeling faint, dizziness, confusion, trouble staying awake Thoughts of suicide or self-harm, worsening mood, or feelings of depression Side effects that usually do not require medical attention (report to your care team if they continue or are bothersome): Dizziness Drowsiness Loss of balance or coordination Nausea This list may not describe all possible side effects. Call your doctor for medical advice about side effects. You may report side effects to FDA at 1-800-FDA-1088. Where should I keep my medication? Keep out of the reach of children and pets. This medication may cause accidental overdose and death if it is taken by other adults, children, or pets. Mix any unused medication with a substance like cat litter or coffee grounds. Then throw the medication away in a sealed container like a sealed bag or a coffee can with a lid. Do not use the medication after the expiration date. Store at room temperature between 15 and 30 degrees C (59 and 86 degrees F). NOTE: This sheet is a summary. It may not cover all possible information. If you have questions about this medicine, talk to your doctor, pharmacist, or health care provider.  2024 Elsevier/Gold Standard  (2022-10-01 00:00:00)

## 2024-01-16 ENCOUNTER — Ambulatory Visit: Payer: Self-pay | Admitting: Neurology

## 2024-01-16 LAB — COMPREHENSIVE METABOLIC PANEL WITH GFR
ALT: 20 IU/L (ref 0–32)
AST: 21 IU/L (ref 0–40)
Albumin: 4.7 g/dL (ref 3.9–4.9)
Alkaline Phosphatase: 52 IU/L (ref 49–135)
BUN/Creatinine Ratio: 20 (ref 12–28)
BUN: 19 mg/dL (ref 8–27)
Bilirubin Total: 0.3 mg/dL (ref 0.0–1.2)
CO2: 22 mmol/L (ref 20–29)
Calcium: 10.4 mg/dL — ABNORMAL HIGH (ref 8.7–10.3)
Chloride: 105 mmol/L (ref 96–106)
Creatinine, Ser: 0.93 mg/dL (ref 0.57–1.00)
Globulin, Total: 3.1 g/dL (ref 1.5–4.5)
Glucose: 102 mg/dL — ABNORMAL HIGH (ref 70–99)
Potassium: 4.6 mmol/L (ref 3.5–5.2)
Sodium: 142 mmol/L (ref 134–144)
Total Protein: 7.8 g/dL (ref 6.0–8.5)
eGFR: 67 mL/min/1.73 (ref >59)

## 2024-01-18 ENCOUNTER — Telehealth: Admitting: Adult Health

## 2024-01-30 ENCOUNTER — Ambulatory Visit: Admitting: Adult Health

## 2024-04-02 ENCOUNTER — Encounter: Payer: Self-pay | Admitting: Neurology
